# Patient Record
Sex: Female | Born: 1950 | Race: White | Hispanic: No | Marital: Married | Smoking: Former smoker
Health system: Southern US, Community
[De-identification: ages and names within clinical notes are randomized; demographics above are authoritative.]

## PROBLEM LIST (undated history)

## (undated) DIAGNOSIS — J45909 Unspecified asthma, uncomplicated: Secondary | ICD-10-CM

## (undated) DIAGNOSIS — F32A Depression, unspecified: Secondary | ICD-10-CM

## (undated) DIAGNOSIS — G629 Polyneuropathy, unspecified: Secondary | ICD-10-CM

## (undated) DIAGNOSIS — Z87898 Personal history of other specified conditions: Secondary | ICD-10-CM

## (undated) DIAGNOSIS — G8929 Other chronic pain: Secondary | ICD-10-CM

## (undated) DIAGNOSIS — F329 Major depressive disorder, single episode, unspecified: Secondary | ICD-10-CM

## (undated) DIAGNOSIS — M549 Dorsalgia, unspecified: Secondary | ICD-10-CM

## (undated) DIAGNOSIS — Z8542 Personal history of malignant neoplasm of other parts of uterus: Secondary | ICD-10-CM

## (undated) DIAGNOSIS — I509 Heart failure, unspecified: Secondary | ICD-10-CM

## (undated) DIAGNOSIS — E781 Pure hyperglyceridemia: Secondary | ICD-10-CM

## (undated) DIAGNOSIS — I10 Essential (primary) hypertension: Secondary | ICD-10-CM

## (undated) HISTORY — DX: Essential (primary) hypertension: I10

## (undated) HISTORY — DX: Dorsalgia, unspecified: M54.9

## (undated) HISTORY — DX: Unspecified asthma, uncomplicated: J45.909

## (undated) HISTORY — DX: Other chronic pain: G89.29

## (undated) HISTORY — DX: Morbid (severe) obesity due to excess calories: E66.01

## (undated) HISTORY — DX: Polyneuropathy, unspecified: G62.9

## (undated) HISTORY — DX: Personal history of other specified conditions: Z87.898

## (undated) HISTORY — PX: REPLACEMENT TOTAL KNEE: SUR1224

## (undated) HISTORY — DX: Personal history of malignant neoplasm of other parts of uterus: Z85.42

## (undated) HISTORY — DX: Major depressive disorder, single episode, unspecified: F32.9

## (undated) HISTORY — PX: TOTAL ABDOMINAL HYSTERECTOMY W/ BILATERAL SALPINGOOPHORECTOMY: SHX83

## (undated) HISTORY — DX: Pure hyperglyceridemia: E78.1

## (undated) HISTORY — DX: Depression, unspecified: F32.A

---

## 2001-06-29 ENCOUNTER — Encounter: Payer: Self-pay | Admitting: Emergency Medicine

## 2001-06-29 ENCOUNTER — Emergency Department (HOSPITAL_COMMUNITY): Admission: EM | Admit: 2001-06-29 | Discharge: 2001-06-29 | Payer: Self-pay | Admitting: Emergency Medicine

## 2002-11-29 ENCOUNTER — Other Ambulatory Visit: Admission: RE | Admit: 2002-11-29 | Discharge: 2002-11-29 | Payer: Self-pay | Admitting: Dermatology

## 2006-07-30 HISTORY — PX: CARPAL TUNNEL RELEASE: SHX101

## 2006-08-31 ENCOUNTER — Encounter: Payer: Self-pay | Admitting: Physician Assistant

## 2006-09-20 ENCOUNTER — Ambulatory Visit: Payer: Self-pay | Admitting: Cardiology

## 2006-09-20 ENCOUNTER — Encounter: Payer: Self-pay | Admitting: Physician Assistant

## 2006-09-21 ENCOUNTER — Encounter: Payer: Self-pay | Admitting: Cardiology

## 2006-09-28 ENCOUNTER — Ambulatory Visit: Payer: Self-pay | Admitting: Cardiology

## 2007-12-23 ENCOUNTER — Encounter: Payer: Self-pay | Admitting: Cardiology

## 2009-07-24 ENCOUNTER — Encounter: Payer: Self-pay | Admitting: Cardiology

## 2009-07-31 ENCOUNTER — Encounter: Payer: Self-pay | Admitting: Cardiology

## 2009-08-08 ENCOUNTER — Encounter (INDEPENDENT_AMBULATORY_CARE_PROVIDER_SITE_OTHER): Payer: Self-pay | Admitting: *Deleted

## 2009-08-08 ENCOUNTER — Ambulatory Visit: Payer: Self-pay | Admitting: Cardiology

## 2009-08-08 DIAGNOSIS — R079 Chest pain, unspecified: Secondary | ICD-10-CM | POA: Insufficient documentation

## 2009-08-08 DIAGNOSIS — J45909 Unspecified asthma, uncomplicated: Secondary | ICD-10-CM | POA: Insufficient documentation

## 2009-08-08 DIAGNOSIS — G4733 Obstructive sleep apnea (adult) (pediatric): Secondary | ICD-10-CM | POA: Insufficient documentation

## 2009-08-08 DIAGNOSIS — E7800 Pure hypercholesterolemia, unspecified: Secondary | ICD-10-CM | POA: Insufficient documentation

## 2009-08-08 DIAGNOSIS — E781 Pure hyperglyceridemia: Secondary | ICD-10-CM

## 2009-08-08 DIAGNOSIS — R072 Precordial pain: Secondary | ICD-10-CM | POA: Insufficient documentation

## 2009-08-08 DIAGNOSIS — I10 Essential (primary) hypertension: Secondary | ICD-10-CM | POA: Insufficient documentation

## 2009-08-08 DIAGNOSIS — R609 Edema, unspecified: Secondary | ICD-10-CM | POA: Insufficient documentation

## 2009-08-09 ENCOUNTER — Encounter: Payer: Self-pay | Admitting: Cardiology

## 2009-08-10 ENCOUNTER — Ambulatory Visit: Payer: Self-pay | Admitting: Cardiology

## 2009-08-10 ENCOUNTER — Inpatient Hospital Stay (HOSPITAL_BASED_OUTPATIENT_CLINIC_OR_DEPARTMENT_OTHER): Admission: RE | Admit: 2009-08-10 | Discharge: 2009-08-10 | Payer: Self-pay | Admitting: Cardiology

## 2009-08-23 ENCOUNTER — Encounter (INDEPENDENT_AMBULATORY_CARE_PROVIDER_SITE_OTHER): Payer: Self-pay | Admitting: *Deleted

## 2010-04-03 LAB — CBC
HCT: 42.7 % (ref 36.0–46.0)
Hemoglobin: 14.7 g/dL (ref 12.0–15.0)
MCH: 31.7 pg (ref 26.0–34.0)
MCHC: 34.4 g/dL (ref 30.0–36.0)
MCV: 92 fL (ref 78.0–100.0)
Platelets: 187 10*3/uL (ref 150–400)
RBC: 4.64 MIL/uL (ref 3.87–5.11)
RDW: 13.6 % (ref 11.5–15.5)
WBC: 7.5 10*3/uL (ref 4.0–10.5)

## 2010-04-03 LAB — BASIC METABOLIC PANEL
BUN: 12 mg/dL (ref 6–23)
CO2: 30 mEq/L (ref 19–32)
Calcium: 9.6 mg/dL (ref 8.4–10.5)
Chloride: 105 mEq/L (ref 96–112)
Creatinine, Ser: 0.72 mg/dL (ref 0.4–1.2)
GFR calc Af Amer: >60 mL/min (ref >60)
GFR calc non Af Amer: >60 mL/min (ref >60)
Glucose, Bld: 95 mg/dL (ref 70–99)
Potassium: 4.6 mEq/L (ref 3.5–5.1)
Sodium: 142 mEq/L (ref 135–145)

## 2010-04-03 LAB — TYPE AND SCREEN
ABO/RH(D): A POS
Antibody Screen: NEGATIVE

## 2010-04-03 LAB — ABO/RH: ABO/RH(D): A POS

## 2010-04-09 ENCOUNTER — Inpatient Hospital Stay (HOSPITAL_COMMUNITY)
Admission: RE | Admit: 2010-04-09 | Discharge: 2010-04-12 | Payer: Self-pay | Source: Home / Self Care | Attending: Orthopedic Surgery | Admitting: Orthopedic Surgery

## 2010-04-15 LAB — PROTIME-INR
INR: 1.03 (ref 0.00–1.49)
INR: 1.11 (ref 0.00–1.49)
INR: 1.2 (ref 0.00–1.49)
Prothrombin Time: 13.7 seconds (ref 11.6–15.2)
Prothrombin Time: 14.5 seconds (ref 11.6–15.2)
Prothrombin Time: 15.4 seconds — ABNORMAL HIGH (ref 11.6–15.2)

## 2010-04-15 LAB — BASIC METABOLIC PANEL
BUN: 12 mg/dL (ref 6–23)
BUN: 12 mg/dL (ref 6–23)
CO2: 26 mEq/L (ref 19–32)
CO2: 32 mEq/L (ref 19–32)
Calcium: 8.5 mg/dL (ref 8.4–10.5)
Calcium: 9.1 mg/dL (ref 8.4–10.5)
Chloride: 99 mEq/L (ref 96–112)
Chloride: 103 mEq/L (ref 96–112)
Creatinine, Ser: 0.66 mg/dL (ref 0.4–1.2)
Creatinine, Ser: 0.74 mg/dL (ref 0.4–1.2)
GFR calc Af Amer: >60 mL/min (ref >60)
GFR calc Af Amer: >60 mL/min (ref >60)
GFR calc non Af Amer: >60 mL/min (ref >60)
GFR calc non Af Amer: >60 mL/min (ref >60)
Glucose, Bld: 137 mg/dL — ABNORMAL HIGH (ref 70–99)
Glucose, Bld: 139 mg/dL — ABNORMAL HIGH (ref 70–99)
Potassium: 4.7 mEq/L (ref 3.5–5.1)
Potassium: 4.8 mEq/L (ref 3.5–5.1)
Sodium: 134 mEq/L — ABNORMAL LOW (ref 135–145)
Sodium: 139 mEq/L (ref 135–145)

## 2010-04-15 LAB — CBC
HCT: 36 % (ref 36.0–46.0)
HCT: 35 % — ABNORMAL LOW (ref 36.0–46.0)
HCT: 32.5 % — ABNORMAL LOW (ref 36.0–46.0)
HCT: 32.3 % — ABNORMAL LOW (ref 36.0–46.0)
Hemoglobin: 12.2 g/dL (ref 12.0–15.0)
Hemoglobin: 11.7 g/dL — ABNORMAL LOW (ref 12.0–15.0)
Hemoglobin: 11 g/dL — ABNORMAL LOW (ref 12.0–15.0)
Hemoglobin: 11.1 g/dL — ABNORMAL LOW (ref 12.0–15.0)
MCH: 31.3 pg (ref 26.0–34.0)
MCH: 30.7 pg (ref 26.0–34.0)
MCH: 31.2 pg (ref 26.0–34.0)
MCH: 31.3 pg (ref 26.0–34.0)
MCHC: 33.9 g/dL (ref 30.0–36.0)
MCHC: 33.4 g/dL (ref 30.0–36.0)
MCHC: 33.8 g/dL (ref 30.0–36.0)
MCHC: 34.4 g/dL (ref 30.0–36.0)
MCV: 92.3 fL (ref 78.0–100.0)
MCV: 91.9 fL (ref 78.0–100.0)
MCV: 92.1 fL (ref 78.0–100.0)
MCV: 91 fL (ref 78.0–100.0)
Platelets: 32 10*3/uL — ABNORMAL LOW (ref 150–400)
Platelets: 156 10*3/uL (ref 150–400)
Platelets: 151 10*3/uL (ref 150–400)
Platelets: 161 10*3/uL (ref 150–400)
RBC: 3.9 MIL/uL (ref 3.87–5.11)
RBC: 3.81 MIL/uL — ABNORMAL LOW (ref 3.87–5.11)
RBC: 3.53 MIL/uL — ABNORMAL LOW (ref 3.87–5.11)
RBC: 3.55 MIL/uL — ABNORMAL LOW (ref 3.87–5.11)
RDW: 14 % (ref 11.5–15.5)
RDW: 13.8 % (ref 11.5–15.5)
RDW: 14.1 % (ref 11.5–15.5)
RDW: 13.7 % (ref 11.5–15.5)
WBC: 8.5 10*3/uL (ref 4.0–10.5)
WBC: 9 10*3/uL (ref 4.0–10.5)
WBC: 9.5 10*3/uL (ref 4.0–10.5)
WBC: 10 10*3/uL (ref 4.0–10.5)

## 2010-04-15 LAB — URINALYSIS, MICROSCOPIC ONLY
Hgb urine dipstick: NEGATIVE
Ketones, ur: NEGATIVE mg/dL
Nitrite: POSITIVE — AB
Protein, ur: NEGATIVE mg/dL
Specific Gravity, Urine: 1.031 — ABNORMAL HIGH (ref 1.005–1.030)
Urine Glucose, Fasting: NEGATIVE mg/dL
Urobilinogen, UA: 1 mg/dL (ref 0.0–1.0)
pH: 5.5 (ref 5.0–8.0)

## 2010-04-15 LAB — COMPREHENSIVE METABOLIC PANEL
ALT: 20 U/L (ref 0–35)
AST: 17 U/L (ref 0–37)
Albumin: 3 g/dL — ABNORMAL LOW (ref 3.5–5.2)
Alkaline Phosphatase: 39 U/L (ref 39–117)
BUN: 7 mg/dL (ref 6–23)
CO2: 31 mEq/L (ref 19–32)
Calcium: 8.3 mg/dL — ABNORMAL LOW (ref 8.4–10.5)
Chloride: 96 mEq/L (ref 96–112)
Creatinine, Ser: 0.62 mg/dL (ref 0.4–1.2)
GFR calc Af Amer: >60 mL/min (ref >60)
GFR calc non Af Amer: >60 mL/min (ref >60)
Glucose, Bld: 130 mg/dL — ABNORMAL HIGH (ref 70–99)
Potassium: 4.6 mEq/L (ref 3.5–5.1)
Sodium: 131 mEq/L — ABNORMAL LOW (ref 135–145)
Total Bilirubin: 1.1 mg/dL (ref 0.3–1.2)
Total Protein: 6.5 g/dL (ref 6.0–8.3)

## 2010-04-15 LAB — URINALYSIS, ROUTINE W REFLEX MICROSCOPIC
Hgb urine dipstick: NEGATIVE
Ketones, ur: NEGATIVE mg/dL
Nitrite: NEGATIVE
Protein, ur: NEGATIVE mg/dL
Specific Gravity, Urine: 1.023 (ref 1.005–1.030)
Urine Glucose, Fasting: NEGATIVE mg/dL
Urobilinogen, UA: 1 mg/dL (ref 0.0–1.0)
pH: 5.5 (ref 5.0–8.0)

## 2010-04-15 LAB — URINE CULTURE
Colony Count: NO GROWTH
Culture  Setup Time: 201201112254
Culture: NO GROWTH

## 2010-04-15 LAB — URINE MICROSCOPIC-ADD ON

## 2010-04-19 NOTE — Discharge Summary (Signed)
[name], [name] NO.:  [id number]  MEDICAL RECORD NO.:  [id number]          PATIENT TYPE:  INP  LOCATION:  5016                         FACILITY:  [facility]  PHYSICIAN:  Eulas Post, MD    DATE OF BIRTH:  October 23, 1950  DATE OF ADMISSION:  04/09/2010 DATE OF DISCHARGE:  04/12/2010                              DISCHARGE SUMMARY   ADMISSION DIAGNOSIS:  His left knee osteoarthritis.  DISCHARGE DIAGNOSIS:  His left knee osteoarthritis.  ADDITIONAL DIAGNOSES: 1. Chronic baseline urinary frequency as well as hematuria. 2. Morbid obesity. 3. Asthma.  HOSPITAL COURSE:  Ms. Jenna Sherman is a 60 year old woman who presented for elective left total knee arthroplasty.  She tolerated the procedure well and postoperatively did not have any complications.  She is given Coumadin for DVT prophylaxis as well as early ambulation and sequential compression devices.  I had initially planned to give her Lovenox and Coumadin, however, she was having dark-colored urine, as well as questionable thrombocytopenia that actually turned out to be a lab error and given the hematuria, I did not continue with Lovenox, and rather simply used Coumadin and sequential compression devices.  The sequence of events was as follows:  Prior to Foley placement, she had a urinalysis, which demonstrated hyaline casts as well as calcium oxalate crystals.  According to her husband, she has had substantial urinary frequency for the last year or so.  She has also had extremely dark amber urine for the past year, and has also had a very foul smelling urine.  She seen her primary care physician for this, but has not seen a urologist.  Upon identifying these casts and also the mucus within the urine and also the discoloration and the calcium oxalate crystals, I consulted Nephrology who saw Jenna Sherman and indicated that her urinalysis findings were incidental and of no consequence.  They recommended  hematology consultation, given that it appears that her platelet count was extremely low.  The platelet count itself was reported as 32,000. Apparently, however, the specimen itself had clotted, and hematology consult saw the patient and indicated that the results were spurious, and to be ignored.  Nevertheless, I had already discontinued her Lovenox, and continued to hold the Lovenox due to the color of the urine and concern for active bleeding.  The hematology consultation recommended urology consultation.  I did speak with Dr. Jethro Bolus who recommended outpatient urologic followup for her chronic polyuria, odor, and discoloration of the urine.  She subsequently had her Foley removed, and was able to void independently, and was improving quite nicely and was working with physical therapy, making steady progress.  Her dressings were changed on postoperative day #3, and her wounds were clean, dry, and intact. Sensation was intact throughout her leg.  EHL and FHL were firing.  Her hemoglobin, hematocrit, and platelets remained stable throughout her hospital stay, as did her renal function and electrolyte panel.  She is planned to be discharged home with follow up with me in approximately 2 weeks, and I have also recommended a follow up with Dr. Patsi Sears at [facility] for  her urologic issues.  She benefited maximum from this hospital stay and is being discharged home.     Eulas Post, MD     JPL/MEDQ  D:  04/12/2010  T:  04/12/2010  Job:  [id number]  Electronically Signed by Teryl Lucy MD on 04/19/2010 10:03:22 AM

## 2010-04-25 LAB — SURGICAL PCR SCREEN
MRSA, PCR: NEGATIVE
Staphylococcus aureus: NEGATIVE

## 2010-04-30 NOTE — Letter (Signed)
Summary: Engineer, materials at [facility]  [address]   [place], [place] [address]   Phone: [phone]  Fax: [phone]        Aug 23, 2009 MRN: [id number]   [facility] [address] [place], [place]  [address]   Dear Ms. Yzaguirre,  Your test ordered by Selena Batten has been reviewed by your physician (or physician assistant) and was found to be normal or stable. Your physician (or physician assistant) felt no changes were needed at this time.  ____ Echocardiogram  ____ Cardiac Stress Test  __X__ Lab Work  ____ Peripheral vascular study of arms, legs or neck  __X__ Chest X-ray  ____ Lung or Breathing test  ____ Other:   Thank you.   Laray Anger, M.D., F.A.C.C. Thressa Sheller, M.D., F.A.C.C. Oneal Grout, M.D., F.A.C.C. Cheree Ditto, M.D., F.A.C.C. Daiva Nakayama, M.D., F.A.C.C. Kenney Houseman, M.D., F.A.C.C. Jeanne Ivan, PA-C

## 2010-04-30 NOTE — Letter (Signed)
Summary: External Correspondence/ OFFICE NOTE DR. [name]  External Correspondence/ OFFICE NOTE DR. [name]   Imported By: Dorise Hiss 08/06/2009 11:13:31  _____________________________________________________________________  External Attachment:    Type:   Image     Comment:   External Document

## 2010-04-30 NOTE — Letter (Signed)
Summary: External Correspondence/ FAXED PRE-CATH ORDER  External Correspondence/ FAXED PRE-CATH ORDER   Imported By: Dorise Hiss 08/30/2009 12:39:14  _____________________________________________________________________  External Attachment:    Type:   Image     Comment:   External Document

## 2010-04-30 NOTE — Letter (Signed)
Summary: Cardiac Cath Instructions - [facility]  [facility] [address]   [place], [place] [address]   Phone: [phone]  Fax: [phone]     08/08/2009 MRN: [id number]  [facility] [address] [place], [place]  [address]  Dear Ms. Grosser,   You are scheduled for a Cardiac Catheterization on Friday, May 13 at 9:30 with Dr. Shirlee Latch.   Please arrive to the 1st floor of the Heart and Vascular Center at [facility] at 8:30am / pm on the day of your procedure. Please do not arrive before 6:30 a.m. Call the Heart and Vascular Center at [phone] if you are unable to make your appointmnet. The Code to get into the parking garage under the building is 0010 . Take the elevators to the 1st floor. You must have someone to drive you home. Someone must be with you for the first 24 hours after you arrive home. Please wear clothes that are easy to get on and off and wear slip-on shoes. Do not eat or drink after midnight except water with your medications that morning. Bring all your medications and current insurance cards with you.  ___ DO NOT take these medications before your procedure:  _X__ Make sure you take your aspirin.  _X__ You may take ALL of your medications with water that morning.  ___ DO NOT take ANY medications before your procedure.  ___ Pre-med instructions:  ________________________________________________________________________  The usual length of stay after your procedure is 2 to 3 hours. This can vary.  If you have any questions, please call the office at the number listed above.  Hoover Brunette, LPN                 Directions to the [facility]  Please Note : Park in [place] under the building not the parking deck.  From [employer]: Turn onto [address] Left onto [address] (1st stoplight) Right at the brick entrance to the hospital ([address] circle [address]) Bear to the right and  you will see a blue sign "Heart and Vascular Center" Parking garage is a sharp right'to get through the gate out in the code _______. Once you park, take the elevator to the first floor. Please do not arrive before 0630am. The building will be dark before that time.   From [address] Turn onto [address] Turn left into the brick entrance to the hospital (Main circle drive) [place] to the right and you will see a blue sign "[facility] Center" Parking garage is a sharp right, to get thru the gate put in the code ____. Once you park, take the elevator to the first floor. Please do not arrive before 0630am. The building will be dark before that time
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
## 2010-07-03 ENCOUNTER — Other Ambulatory Visit: Payer: Self-pay | Admitting: Cardiology

## 2010-08-13 NOTE — Assessment & Plan Note (Signed)
[facility]                          [facility] OFFICE NOTE   Jenna Sherman, Jenna Sherman                         MRN:          [id number]  DATE:09/28/2006                            DOB:          1950-10-22    HISTORY OF PRESENT ILLNESS:  The patient is a 60 year old female with no  prior documented history of coronary artery disease.  The patient was  evaluated for chest pain on September 21, 2006 in the hospital.  She had  normal serum cardiac markers.  The patient did have multiple risk  factors including hypertriglyceridemia, obesity, and a strong family  history of coronary artery disease.  The patient was scheduled for a  Cardiolite study.  This demonstrated no definite significant  abnormalities although the patient reported chest pain during the test,  this was an infusion study and this is nondiagnostic.  There were no EKG  changes however seen of ischemia and the perfusion data were also  normal.   The patient states that she was called by Dr. Dian Situ office and was  told by Ron that although the stress test looked okay there were still  some problems with her blood vessels.  I assume this referred to the  fact that they read that the patient had clinically positive test for  ischemia although this was nondiagnostic due to the fact that adenosine  was used.  I am not sure that this was related properly to the patient.  It does also not appear that she had ABIs done or carotid Dopplers so I  do not think any other testing was done.   MEDICATIONS:  Tricor, Advair inhaler, and aspirin.   PHYSICAL EXAMINATION:  VITAL SIGNS:  146/78, heart rate 73, weight is  260 pounds.  NECK:  Normal carotid upstroke and no carotid bruits.  LUNGS: Clear breath sounds bilaterally.  HEART:  Regular rate and rhythm, normal S1-S2.  No murmurs, rubs, or  gallops.  ABDOMEN:  Soft, nontender, no rebound or guarding.  Good bowel sounds.  EXTREMITY:  No cyanosis,  clubbing, or edema.  NEURO:  Patient is alert, oriented, and grossly nonfocal.   PROBLEM:  1. History of substernal chest pain, ruled out for myocardial      infarction with negative Cardiolite study.  2. Possible hypertension.  3. Obesity.  4. Hypertriglyceridemia.  5. Exertional dyspnea secondary to deconditioning and obesity.   PLAN:  1. The patient's stress study was essentially within normal limits.      The patient did report chest pain during drug infusion but this was      adenosine, therefore nondiagnostic.  The study was reassuring and I      related this to the patient.  I do not think she has significant      coronary artery disease based on this data.  She also reports no      recurrent symptoms of chest pain.  2. I had a long talk with the patient regarding lifestyle      modifications and that she needs to lose significant amount of  weight which is contributing to her dyspnea.  3. The patient can follow up with [place] in 3 months, no further      cardiovascular testing is required.  The patient needs aggressive      risk factor modification by her primary care physician.     Learta Codding, MD,FACC  Electronically Signed    GED/MedQ  DD: 09/28/2006  DT: 09/28/2006  Job #: [id number]   cc:   Mila Palmer

## 2010-10-14 ENCOUNTER — Other Ambulatory Visit: Payer: Self-pay | Admitting: Cardiology

## 2011-10-15 ENCOUNTER — Encounter: Payer: Self-pay | Admitting: Cardiology

## 2011-11-02 IMAGING — CR DG KNEE 1-2V PORT*L*
2 series · 2 of 2 positions shown · non-contrast
Comparison: None.

CLINICAL DATA: Postop left total knee replacement.

PORTABLE LEFT KNEE - 1-2 VIEW

[view not recorded (1 of 2)]
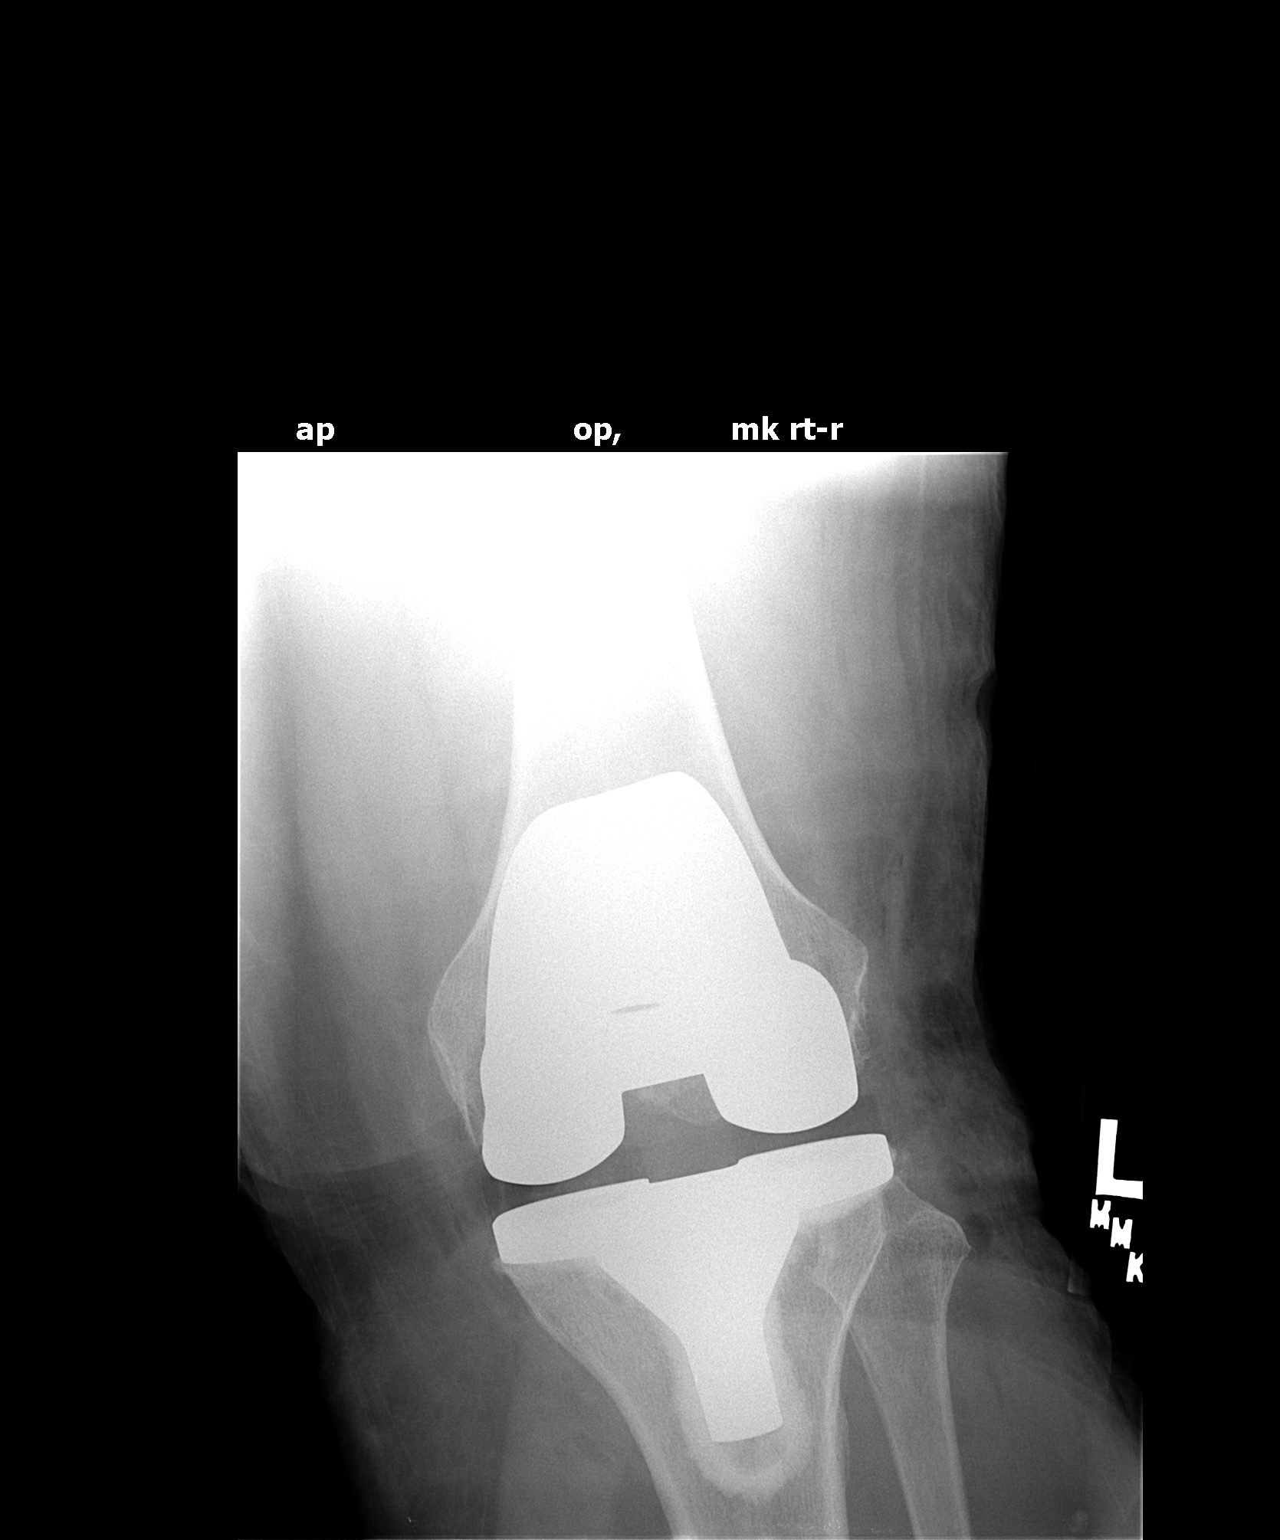

[view not recorded (2 of 2)]
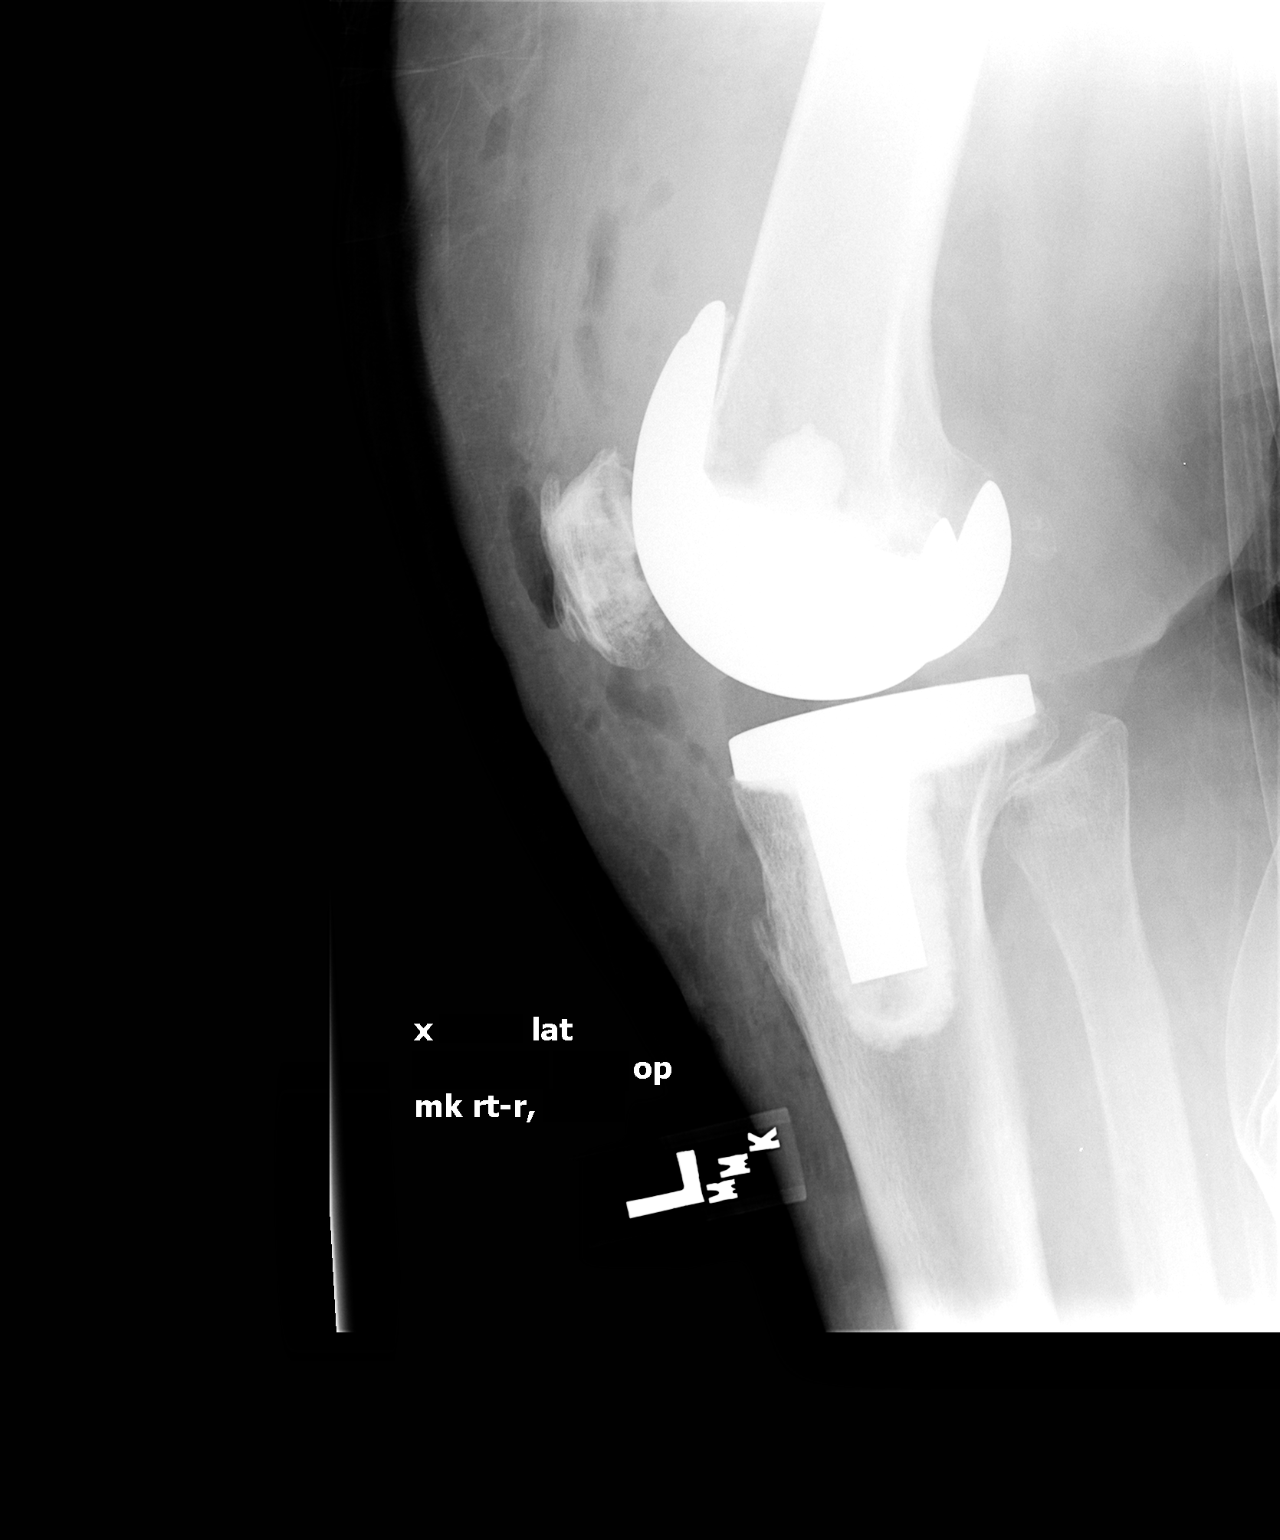

[2 of 2 positions shown; findings below may reference images not displayed]

FINDINGS: 7426 hours.  The patient is status post left total knee
replacement.  The hardware appears well positioned.  There is a
knee joint effusion with air in the joint and immediate surrounding
soft tissues.  There is no evidence of acute fracture or
dislocation.
IMPRESSION: No demonstrated complication following left total knee replacement.

## 2012-12-16 ENCOUNTER — Ambulatory Visit: Payer: Self-pay | Admitting: Cardiovascular Disease

## 2012-12-16 ENCOUNTER — Encounter: Payer: Self-pay | Admitting: Cardiovascular Disease

## 2013-06-02 ENCOUNTER — Ambulatory Visit (INDEPENDENT_AMBULATORY_CARE_PROVIDER_SITE_OTHER): Payer: BC Managed Care – PPO | Admitting: Cardiology

## 2013-06-02 ENCOUNTER — Encounter: Payer: Self-pay | Admitting: *Deleted

## 2013-06-02 ENCOUNTER — Encounter: Payer: Self-pay | Admitting: Cardiology

## 2013-06-02 VITALS — BP 147/76 | HR 68 | Ht 59.0 in | Wt 273.0 lb

## 2013-06-02 DIAGNOSIS — M79606 Pain in leg, unspecified: Secondary | ICD-10-CM

## 2013-06-02 DIAGNOSIS — R011 Cardiac murmur, unspecified: Secondary | ICD-10-CM

## 2013-06-02 DIAGNOSIS — M79609 Pain in unspecified limb: Secondary | ICD-10-CM

## 2013-06-02 DIAGNOSIS — Z136 Encounter for screening for cardiovascular disorders: Secondary | ICD-10-CM

## 2013-06-02 DIAGNOSIS — R079 Chest pain, unspecified: Secondary | ICD-10-CM

## 2013-06-02 NOTE — Progress Notes (Signed)
Clinical Summary Jenna Sherman is a 63 y.o.female seen today as a new patient for chest pain.   1. Chest pain - started approx 1 year ago. Tightness in chest down left arm, 7/10. Feels nauseous, sweaty. + SOB. Can occur at rest or with exertion. Lasts for 1-2 minutes. Occurs twice a week. Increasing in frequency, increasing in severity. Not positional. DOE at < 1/2 block, which is worst than before.  - prior stress test 2008, no ischemia  CAD risk factors: HL, HTN, father MI age 48  2, Leg pain - right leg pain, cramping and swelling in right calf. Hurts at rest and with exertion. Has been bothering x 1 month. Reports she has a son with a history of blood clots, no personal history.    Past Medical History  Diagnosis Date  . Hypertriglyceridemia   . Asthma   . Chronic back pain   . Morbid obesity   . History of vertigo   . Hypertension     Normal LVF (EF 65%) by 2D Echo, August 2005. Mild left ventricular hypertrophy; mild mitral regurgitation  . History of uterine cancer   . Peripheral neuropathy   . Depression      Allergies no known allergies   Current Outpatient Prescriptions  Medication Sig Dispense Refill  . cetirizine (ZYRTEC) 10 MG tablet Take 10 mg by mouth daily.      . clonazePAM (KLONOPIN) 0.5 MG tablet Take 0.5 mg by mouth every 8 (eight) hours as needed for anxiety.      . cyclobenzaprine (FLEXERIL) 10 MG tablet Take 10 mg by mouth at bedtime.      . diclofenac (VOLTAREN) 75 MG EC tablet Take 75 mg by mouth 2 (two) times daily.      Marland Kitchen escitalopram (LEXAPRO) 10 MG tablet Take 10 mg by mouth daily.      Marland Kitchen gabapentin (NEURONTIN) 300 MG capsule Take 300 mg by mouth at bedtime.      . IMDUR 30 MG 24 hr tablet TAKE 1 TABLET ONCE DAILY  15 each  0   No current facility-administered medications for this visit.     Past Surgical History  Procedure Laterality Date  . Total abdominal hysterectomy w/ bilateral salpingoophorectomy      uterine cancer  . Carpal  tunnel release  07/2006     Allergies no known allergies    Family History  Problem Relation Age of Onset  . Heart attack Brother 43  . Heart attack Father 27  . CAD Brother   . CAD Father   . Heart disease Sister     x's 2     Social History Jenna Sherman reports that she quit smoking about 45 years ago. Her smoking use included Cigarettes. She smoked 0.00 packs per day. She does not have any smokeless tobacco history on file. Jenna Sherman has no alcohol history on file.   Review of Systems CONSTITUTIONAL: No weight loss, fever, chills, weakness or fatigue.  HEENT: Eyes: No visual loss, blurred vision, double vision or yellow sclerae.No hearing loss, sneezing, congestion, runny nose or sore throat.  SKIN: No rash or itching.  CARDIOVASCULAR: per HPI RESPIRATORY: shortness of breath.  GASTROINTESTINAL: No anorexia, nausea, vomiting or diarrhea. No abdominal pain or blood.  GENITOURINARY: No burning on urination, no polyuria NEUROLOGICAL: No headache, dizziness, syncope, paralysis, ataxia, numbness or tingling in the extremities. No change in bowel or bladder control.  MUSCULOSKELETAL: leg pain  LYMPHATICS: No enlarged nodes. No  history of splenectomy.  PSYCHIATRIC: No history of depression or anxiety.  ENDOCRINOLOGIC: No reports of sweating, cold or heat intolerance. No polyuria or polydipsia.  Marland Kitchen.   Physical Examination p 68 bp 147/76 Wt 273 lbs BMI 55 Gen: resting comfortably, no acute distress HEENT: no scleral icterus, pupils equal round and reactive, no palptable cervical adenopathy,  CV: RRR, 2/6 systolic murmur RUSB early peaking, no JVD, no carotid bruits Resp: Clear to auscultation bilaterally GI: abdomen is soft, non-tender, non-distended, normal bowel sounds, no hepatosplenomegaly MSK: extremities are warm, no edema.  Skin: warm, no rash Neuro:  no focal deficits Psych: appropriate affect   Diagnostic Studies 06/02/13 Clinic EKG Normal sinus rhythm, LAD, non-spec  ST/T changes    Assessment and Plan   1. Chest pain - multiple CAD risk factors including family history of early CAD/MI - will obtain Lexiscan to futher evaluate  2. Leg pain - obtain LE venous dopplers  3. Heart murmur - obtain echocardiogram  Follow up 3 months   Jenna PocheJonathan F. Sherman, M.D., F.A.C.C.

## 2013-06-02 NOTE — Patient Instructions (Signed)
Your physician recommends that you schedule a follow-up appointment in: 3 month with Dr. Wyline MoodBranch. This appointment will be scheduled today before you leave.  Your physician recommends that you continue on your current medications as directed. Please refer to the Current Medication list given to you today.  Your physician has requested that you have an echocardiogram. Echocardiography is a painless test that uses sound waves to create images of your heart. It provides your doctor with information about the size and shape of your heart and how well your heart's chambers and valves are working. This procedure takes approximately one hour. There are no restrictions for this procedure.  Your physician has requested that you have a lexiscan myoview. For further information please visit [email or web address]. Please follow instruction sheet, as given.  Your physician has requested you have a lower extremity doppler.

## 2013-06-15 ENCOUNTER — Other Ambulatory Visit (INDEPENDENT_AMBULATORY_CARE_PROVIDER_SITE_OTHER): Payer: BC Managed Care – PPO

## 2013-06-15 ENCOUNTER — Other Ambulatory Visit: Payer: Self-pay

## 2013-06-15 DIAGNOSIS — R079 Chest pain, unspecified: Secondary | ICD-10-CM

## 2013-06-15 DIAGNOSIS — R011 Cardiac murmur, unspecified: Secondary | ICD-10-CM

## 2013-06-16 ENCOUNTER — Encounter (INDEPENDENT_AMBULATORY_CARE_PROVIDER_SITE_OTHER): Payer: BC Managed Care – PPO | Admitting: Cardiology

## 2013-06-16 DIAGNOSIS — M7989 Other specified soft tissue disorders: Secondary | ICD-10-CM

## 2013-06-16 DIAGNOSIS — M79609 Pain in unspecified limb: Secondary | ICD-10-CM

## 2013-06-21 ENCOUNTER — Encounter (HOSPITAL_COMMUNITY): Payer: BC Managed Care – PPO

## 2013-06-21 ENCOUNTER — Inpatient Hospital Stay (HOSPITAL_COMMUNITY): Admission: RE | Admit: 2013-06-21 | Payer: BC Managed Care – PPO | Source: Ambulatory Visit

## 2013-06-22 ENCOUNTER — Telehealth: Payer: Self-pay | Admitting: Cardiology

## 2013-06-22 NOTE — Telephone Encounter (Signed)
LM for pt to returncall

## 2013-06-22 NOTE — Telephone Encounter (Signed)
Message copied by Burnice LoganATES, Antoino Westhoff M on Wed Jun 22, 2013 11:51 AM ------      Message from: LakelandBRANCH, JONATHAN F      Created: Mon Jun 20, 2013  9:03 AM       Please let patient know that overall no significant abnormalities on echo            Dina RichJonathan Branch MD ------

## 2013-06-27 ENCOUNTER — Encounter: Payer: Self-pay | Admitting: Cardiology

## 2013-06-27 NOTE — Telephone Encounter (Signed)
Mailed letter to pt informing pt of results of echocardiogram and LE venous duplex

## 2013-08-05 NOTE — Progress Notes (Signed)
This encounter was created in error - please disregard.  This encounter was created in error - please disregard.

## 2013-09-07 ENCOUNTER — Encounter: Payer: Self-pay | Admitting: Cardiology

## 2013-09-07 ENCOUNTER — Ambulatory Visit: Payer: BC Managed Care – PPO | Admitting: Cardiology

## 2013-09-07 NOTE — Progress Notes (Unsigned)
Clinical Summary Jenna Sherman is a 64 y.o.female seen today for follow up of the following medical problems.   1. Chest pain  - started approx 1 year ago. Tightness in chest down left arm, 7/10. Feels nauseous, sweaty. + SOB. Can occur at rest or with exertion. Lasts for 1-2 minutes. Occurs twice a week. Increasing in frequency, increasing in severity. Not positional. DOE at < 1/2 block, which is worst than before.  - prior stress test 2008, no ischemia  CAD risk factors: HL, HTN, father MI age 57   - since last visit she completed an echo which showed LVEF 60-65%, grade I diastolic dysfunction.  ? Stress test   2, Leg pain  - right leg pain, cramping and swelling in right calf. Hurts at rest and with exertion. Has been bothering x 1 month. Reports she has a son with a history of blood clots, no personal history.   Past Medical History  Diagnosis Date  . Hypertriglyceridemia   . Asthma   . Chronic back pain   . Morbid obesity   . History of vertigo   . Hypertension     Normal LVF (EF 65%) by 2D Echo, August 2005. Mild left ventricular hypertrophy; mild mitral regurgitation  . History of uterine cancer   . Peripheral neuropathy   . Depression      No Known Allergies   Current Outpatient Prescriptions  Medication Sig Dispense Refill  . cetirizine (ZYRTEC) 10 MG tablet Take 10 mg by mouth daily.      . clonazePAM (KLONOPIN) 0.5 MG tablet Take 0.5 mg by mouth every 8 (eight) hours as needed for anxiety.      . cyclobenzaprine (FLEXERIL) 10 MG tablet Take 10 mg by mouth at bedtime.      . diclofenac (VOLTAREN) 75 MG EC tablet Take 75 mg by mouth 2 (two) times daily.      Marland Kitchen escitalopram (LEXAPRO) 10 MG tablet Take 10 mg by mouth daily.      Marland Kitchen gabapentin (NEURONTIN) 300 MG capsule Take 300 mg by mouth at bedtime.      . IMDUR 30 MG 24 hr tablet TAKE 1 TABLET ONCE DAILY  15 each  0   No current facility-administered medications for this visit.     Past Surgical History    Procedure Laterality Date  . Total abdominal hysterectomy w/ bilateral salpingoophorectomy      uterine cancer  . Carpal tunnel release  07/2006     No Known Allergies    Family History  Problem Relation Age of Onset  . Heart attack Brother 43  . Heart attack Father 26  . CAD Brother   . CAD Father   . Heart disease Sister     x's 2     Social History Ms. Arizola reports that she quit smoking about 45 years ago. Her smoking use included Cigarettes. She smoked 0.00 packs per day for 1 year. She has never used smokeless tobacco. Ms. Labore has no alcohol history on file.   Review of Systems CONSTITUTIONAL: No weight loss, fever, chills, weakness or fatigue.  HEENT: Eyes: No visual loss, blurred vision, double vision or yellow sclerae.No hearing loss, sneezing, congestion, runny nose or sore throat.  SKIN: No rash or itching.  CARDIOVASCULAR:  RESPIRATORY: No shortness of breath, cough or sputum.  GASTROINTESTINAL: No anorexia, nausea, vomiting or diarrhea. No abdominal pain or blood.  GENITOURINARY: No burning on urination, no polyuria NEUROLOGICAL: No headache, dizziness,  syncope, paralysis, ataxia, numbness or tingling in the extremities. No change in bowel or bladder control.  MUSCULOSKELETAL: No muscle, back pain, joint pain or stiffness.  LYMPHATICS: No enlarged nodes. No history of splenectomy.  PSYCHIATRIC: No history of depression or anxiety.  ENDOCRINOLOGIC: No reports of sweating, cold or heat intolerance. No polyuria or polydipsia.  Marland Kitchen.   Physical Examination There were no vitals filed for this visit. There were no vitals filed for this visit.  Gen: resting comfortably, no acute distress HEENT: no scleral icterus, pupils equal round and reactive, no palptable cervical adenopathy,  CV Resp: Clear to auscultation bilaterally GI: abdomen is soft, non-tender, non-distended, normal bowel sounds, no hepatosplenomegaly MSK: extremities are warm, no edema.  Skin:  warm, no rash Neuro:  no focal deficits Psych: appropriate affect   Diagnostic Studies 05/2013 Echo Study Conclusions  - Procedure narrative: Transthoracic echocardiography. Image quality was poor. - Left ventricle: The cavity size was normal. Wall thickness was increased in a pattern of moderate LVH. Systolic function was normal. The estimated ejection fraction was in the range of 60% to 65%. Images were inadequate for LV wall motion assessment. Doppler parameters are consistent with abnormal left ventricular relaxation (grade 1 diastolic dysfunction). Doppler parameters are consistent with high ventricular filling pressure. - Mitral valve: Mildly thickened leaflets . - Left atrium: The atrium was mildly dilated.  05/2013 LE Venous US No DVT    Assessment and Plan  1. Chest pain  - multiple CAD risk factors including family history of early CAD/MI  - will obtain Lexiscan to futher evaluate   2. Leg pain  - obtain LE venous dopplers   3. Heart murmur  - obtain echocardiogram       Antoine PocheJonathan F. Marylon Verno, M.D., F.A.C.C.

## 2017-04-24 DIAGNOSIS — R252 Cramp and spasm: Secondary | ICD-10-CM | POA: Diagnosis not present

## 2017-04-24 DIAGNOSIS — Z1389 Encounter for screening for other disorder: Secondary | ICD-10-CM | POA: Diagnosis not present

## 2017-04-24 DIAGNOSIS — E7800 Pure hypercholesterolemia, unspecified: Secondary | ICD-10-CM | POA: Diagnosis not present

## 2017-05-27 DIAGNOSIS — E7800 Pure hypercholesterolemia, unspecified: Secondary | ICD-10-CM | POA: Diagnosis not present

## 2017-05-27 DIAGNOSIS — K210 Gastro-esophageal reflux disease with esophagitis: Secondary | ICD-10-CM | POA: Diagnosis not present

## 2017-06-03 DIAGNOSIS — R252 Cramp and spasm: Secondary | ICD-10-CM | POA: Diagnosis not present

## 2017-06-03 DIAGNOSIS — E7800 Pure hypercholesterolemia, unspecified: Secondary | ICD-10-CM | POA: Diagnosis not present

## 2017-10-15 DIAGNOSIS — R252 Cramp and spasm: Secondary | ICD-10-CM | POA: Diagnosis not present

## 2017-10-15 DIAGNOSIS — E7800 Pure hypercholesterolemia, unspecified: Secondary | ICD-10-CM | POA: Diagnosis not present

## 2018-03-04 DIAGNOSIS — A084 Viral intestinal infection, unspecified: Secondary | ICD-10-CM | POA: Diagnosis not present

## 2018-03-04 DIAGNOSIS — H8309 Labyrinthitis, unspecified ear: Secondary | ICD-10-CM | POA: Diagnosis not present

## 2018-03-04 DIAGNOSIS — J069 Acute upper respiratory infection, unspecified: Secondary | ICD-10-CM | POA: Diagnosis not present

## 2018-03-04 DIAGNOSIS — J45901 Unspecified asthma with (acute) exacerbation: Secondary | ICD-10-CM | POA: Diagnosis not present

## 2018-03-04 DIAGNOSIS — J209 Acute bronchitis, unspecified: Secondary | ICD-10-CM | POA: Diagnosis not present

## 2018-03-15 DIAGNOSIS — E7800 Pure hypercholesterolemia, unspecified: Secondary | ICD-10-CM | POA: Diagnosis not present

## 2018-03-15 DIAGNOSIS — Z23 Encounter for immunization: Secondary | ICD-10-CM | POA: Diagnosis not present

## 2018-03-15 DIAGNOSIS — R252 Cramp and spasm: Secondary | ICD-10-CM | POA: Diagnosis not present

## 2018-06-09 DIAGNOSIS — Z87891 Personal history of nicotine dependence: Secondary | ICD-10-CM | POA: Diagnosis not present

## 2018-06-09 DIAGNOSIS — R05 Cough: Secondary | ICD-10-CM | POA: Diagnosis not present

## 2018-06-09 DIAGNOSIS — R0602 Shortness of breath: Secondary | ICD-10-CM | POA: Diagnosis not present

## 2018-06-09 DIAGNOSIS — R079 Chest pain, unspecified: Secondary | ICD-10-CM | POA: Diagnosis not present

## 2018-06-09 DIAGNOSIS — J189 Pneumonia, unspecified organism: Secondary | ICD-10-CM | POA: Diagnosis not present

## 2018-09-06 DIAGNOSIS — R5383 Other fatigue: Secondary | ICD-10-CM | POA: Diagnosis not present

## 2018-09-06 DIAGNOSIS — K210 Gastro-esophageal reflux disease with esophagitis: Secondary | ICD-10-CM | POA: Diagnosis not present

## 2018-09-06 DIAGNOSIS — E7800 Pure hypercholesterolemia, unspecified: Secondary | ICD-10-CM | POA: Diagnosis not present

## 2018-09-09 DIAGNOSIS — E7800 Pure hypercholesterolemia, unspecified: Secondary | ICD-10-CM | POA: Diagnosis not present

## 2018-09-09 DIAGNOSIS — R252 Cramp and spasm: Secondary | ICD-10-CM | POA: Diagnosis not present

## 2018-09-09 DIAGNOSIS — R062 Wheezing: Secondary | ICD-10-CM | POA: Diagnosis not present

## 2019-01-04 DIAGNOSIS — R7301 Impaired fasting glucose: Secondary | ICD-10-CM | POA: Diagnosis not present

## 2019-01-04 DIAGNOSIS — K2100 Gastro-esophageal reflux disease with esophagitis, without bleeding: Secondary | ICD-10-CM | POA: Diagnosis not present

## 2019-01-04 DIAGNOSIS — E7800 Pure hypercholesterolemia, unspecified: Secondary | ICD-10-CM | POA: Diagnosis not present

## 2019-01-06 DIAGNOSIS — R5383 Other fatigue: Secondary | ICD-10-CM | POA: Diagnosis not present

## 2019-01-06 DIAGNOSIS — R252 Cramp and spasm: Secondary | ICD-10-CM | POA: Diagnosis not present

## 2019-01-06 DIAGNOSIS — Z23 Encounter for immunization: Secondary | ICD-10-CM | POA: Diagnosis not present

## 2019-01-06 DIAGNOSIS — E7800 Pure hypercholesterolemia, unspecified: Secondary | ICD-10-CM | POA: Diagnosis not present

## 2019-01-06 DIAGNOSIS — Z0001 Encounter for general adult medical examination with abnormal findings: Secondary | ICD-10-CM | POA: Diagnosis not present

## 2019-05-05 DIAGNOSIS — R7301 Impaired fasting glucose: Secondary | ICD-10-CM | POA: Diagnosis not present

## 2019-05-05 DIAGNOSIS — K2100 Gastro-esophageal reflux disease with esophagitis, without bleeding: Secondary | ICD-10-CM | POA: Diagnosis not present

## 2019-05-05 DIAGNOSIS — R5383 Other fatigue: Secondary | ICD-10-CM | POA: Diagnosis not present

## 2019-05-05 DIAGNOSIS — E7800 Pure hypercholesterolemia, unspecified: Secondary | ICD-10-CM | POA: Diagnosis not present

## 2019-05-12 DIAGNOSIS — J45909 Unspecified asthma, uncomplicated: Secondary | ICD-10-CM | POA: Diagnosis not present

## 2019-05-12 DIAGNOSIS — R7301 Impaired fasting glucose: Secondary | ICD-10-CM | POA: Diagnosis not present

## 2019-09-08 DIAGNOSIS — R7301 Impaired fasting glucose: Secondary | ICD-10-CM | POA: Diagnosis not present

## 2019-09-08 DIAGNOSIS — E7800 Pure hypercholesterolemia, unspecified: Secondary | ICD-10-CM | POA: Diagnosis not present

## 2019-09-08 DIAGNOSIS — K2100 Gastro-esophageal reflux disease with esophagitis, without bleeding: Secondary | ICD-10-CM | POA: Diagnosis not present

## 2019-09-08 DIAGNOSIS — R5383 Other fatigue: Secondary | ICD-10-CM | POA: Diagnosis not present

## 2019-09-12 DIAGNOSIS — R7301 Impaired fasting glucose: Secondary | ICD-10-CM | POA: Diagnosis not present

## 2019-09-12 DIAGNOSIS — M170 Bilateral primary osteoarthritis of knee: Secondary | ICD-10-CM | POA: Diagnosis not present

## 2019-09-12 DIAGNOSIS — J45909 Unspecified asthma, uncomplicated: Secondary | ICD-10-CM | POA: Diagnosis not present

## 2019-09-27 DIAGNOSIS — S8992XA Unspecified injury of left lower leg, initial encounter: Secondary | ICD-10-CM | POA: Diagnosis not present

## 2019-09-27 DIAGNOSIS — S99921A Unspecified injury of right foot, initial encounter: Secondary | ICD-10-CM | POA: Diagnosis not present

## 2019-09-27 DIAGNOSIS — S99922A Unspecified injury of left foot, initial encounter: Secondary | ICD-10-CM | POA: Diagnosis not present

## 2019-09-27 DIAGNOSIS — Z96652 Presence of left artificial knee joint: Secondary | ICD-10-CM | POA: Diagnosis not present

## 2019-10-27 DIAGNOSIS — J449 Chronic obstructive pulmonary disease, unspecified: Secondary | ICD-10-CM | POA: Diagnosis not present

## 2019-10-27 DIAGNOSIS — R5383 Other fatigue: Secondary | ICD-10-CM | POA: Diagnosis not present

## 2020-01-04 DIAGNOSIS — E7800 Pure hypercholesterolemia, unspecified: Secondary | ICD-10-CM | POA: Diagnosis not present

## 2020-01-04 DIAGNOSIS — R7301 Impaired fasting glucose: Secondary | ICD-10-CM | POA: Diagnosis not present

## 2020-01-04 DIAGNOSIS — K2100 Gastro-esophageal reflux disease with esophagitis, without bleeding: Secondary | ICD-10-CM | POA: Diagnosis not present

## 2020-01-04 DIAGNOSIS — R5383 Other fatigue: Secondary | ICD-10-CM | POA: Diagnosis not present

## 2020-01-09 DIAGNOSIS — E782 Mixed hyperlipidemia: Secondary | ICD-10-CM | POA: Diagnosis not present

## 2020-01-09 DIAGNOSIS — Z23 Encounter for immunization: Secondary | ICD-10-CM | POA: Diagnosis not present

## 2020-01-09 DIAGNOSIS — G629 Polyneuropathy, unspecified: Secondary | ICD-10-CM | POA: Diagnosis not present

## 2020-01-09 DIAGNOSIS — R7301 Impaired fasting glucose: Secondary | ICD-10-CM | POA: Diagnosis not present

## 2020-01-09 DIAGNOSIS — J45909 Unspecified asthma, uncomplicated: Secondary | ICD-10-CM | POA: Diagnosis not present

## 2020-02-28 DIAGNOSIS — E7849 Other hyperlipidemia: Secondary | ICD-10-CM | POA: Diagnosis not present

## 2020-02-28 DIAGNOSIS — K219 Gastro-esophageal reflux disease without esophagitis: Secondary | ICD-10-CM | POA: Diagnosis not present

## 2020-02-28 DIAGNOSIS — M170 Bilateral primary osteoarthritis of knee: Secondary | ICD-10-CM | POA: Diagnosis not present

## 2020-05-02 DIAGNOSIS — E782 Mixed hyperlipidemia: Secondary | ICD-10-CM | POA: Diagnosis not present

## 2020-05-02 DIAGNOSIS — E7849 Other hyperlipidemia: Secondary | ICD-10-CM | POA: Diagnosis not present

## 2020-05-02 DIAGNOSIS — Z7289 Other problems related to lifestyle: Secondary | ICD-10-CM | POA: Diagnosis not present

## 2020-05-02 DIAGNOSIS — K2100 Gastro-esophageal reflux disease with esophagitis, without bleeding: Secondary | ICD-10-CM | POA: Diagnosis not present

## 2020-05-02 DIAGNOSIS — E7800 Pure hypercholesterolemia, unspecified: Secondary | ICD-10-CM | POA: Diagnosis not present

## 2020-05-02 DIAGNOSIS — R7301 Impaired fasting glucose: Secondary | ICD-10-CM | POA: Diagnosis not present

## 2020-05-09 DIAGNOSIS — Z1212 Encounter for screening for malignant neoplasm of rectum: Secondary | ICD-10-CM | POA: Diagnosis not present

## 2020-05-09 DIAGNOSIS — J45909 Unspecified asthma, uncomplicated: Secondary | ICD-10-CM | POA: Diagnosis not present

## 2020-05-09 DIAGNOSIS — G629 Polyneuropathy, unspecified: Secondary | ICD-10-CM | POA: Diagnosis not present

## 2020-05-09 DIAGNOSIS — R7301 Impaired fasting glucose: Secondary | ICD-10-CM | POA: Diagnosis not present

## 2020-05-09 DIAGNOSIS — Z1389 Encounter for screening for other disorder: Secondary | ICD-10-CM | POA: Diagnosis not present

## 2020-05-09 DIAGNOSIS — Z0001 Encounter for general adult medical examination with abnormal findings: Secondary | ICD-10-CM | POA: Diagnosis not present

## 2020-06-21 DIAGNOSIS — M810 Age-related osteoporosis without current pathological fracture: Secondary | ICD-10-CM | POA: Diagnosis not present

## 2020-06-21 DIAGNOSIS — Z13820 Encounter for screening for osteoporosis: Secondary | ICD-10-CM | POA: Diagnosis not present

## 2020-06-27 DIAGNOSIS — E7849 Other hyperlipidemia: Secondary | ICD-10-CM | POA: Diagnosis not present

## 2020-06-27 DIAGNOSIS — M170 Bilateral primary osteoarthritis of knee: Secondary | ICD-10-CM | POA: Diagnosis not present

## 2020-06-27 DIAGNOSIS — K219 Gastro-esophageal reflux disease without esophagitis: Secondary | ICD-10-CM | POA: Diagnosis not present

## 2020-07-28 DIAGNOSIS — M170 Bilateral primary osteoarthritis of knee: Secondary | ICD-10-CM | POA: Diagnosis not present

## 2020-07-28 DIAGNOSIS — E7849 Other hyperlipidemia: Secondary | ICD-10-CM | POA: Diagnosis not present

## 2020-07-28 DIAGNOSIS — K219 Gastro-esophageal reflux disease without esophagitis: Secondary | ICD-10-CM | POA: Diagnosis not present

## 2020-08-27 DIAGNOSIS — E7849 Other hyperlipidemia: Secondary | ICD-10-CM | POA: Diagnosis not present

## 2020-08-27 DIAGNOSIS — M170 Bilateral primary osteoarthritis of knee: Secondary | ICD-10-CM | POA: Diagnosis not present

## 2020-08-27 DIAGNOSIS — K219 Gastro-esophageal reflux disease without esophagitis: Secondary | ICD-10-CM | POA: Diagnosis not present

## 2020-08-29 DIAGNOSIS — K2100 Gastro-esophageal reflux disease with esophagitis, without bleeding: Secondary | ICD-10-CM | POA: Diagnosis not present

## 2020-08-29 DIAGNOSIS — E7849 Other hyperlipidemia: Secondary | ICD-10-CM | POA: Diagnosis not present

## 2020-08-29 DIAGNOSIS — R7301 Impaired fasting glucose: Secondary | ICD-10-CM | POA: Diagnosis not present

## 2020-08-29 DIAGNOSIS — E7800 Pure hypercholesterolemia, unspecified: Secondary | ICD-10-CM | POA: Diagnosis not present

## 2020-08-29 DIAGNOSIS — E782 Mixed hyperlipidemia: Secondary | ICD-10-CM | POA: Diagnosis not present

## 2020-09-04 DIAGNOSIS — J45909 Unspecified asthma, uncomplicated: Secondary | ICD-10-CM | POA: Diagnosis not present

## 2020-09-04 DIAGNOSIS — E7849 Other hyperlipidemia: Secondary | ICD-10-CM | POA: Diagnosis not present

## 2020-09-04 DIAGNOSIS — H9190 Unspecified hearing loss, unspecified ear: Secondary | ICD-10-CM | POA: Diagnosis not present

## 2020-09-04 DIAGNOSIS — R42 Dizziness and giddiness: Secondary | ICD-10-CM | POA: Diagnosis not present

## 2020-09-04 DIAGNOSIS — R7301 Impaired fasting glucose: Secondary | ICD-10-CM | POA: Diagnosis not present

## 2020-09-04 DIAGNOSIS — G629 Polyneuropathy, unspecified: Secondary | ICD-10-CM | POA: Diagnosis not present

## 2020-10-28 DIAGNOSIS — K219 Gastro-esophageal reflux disease without esophagitis: Secondary | ICD-10-CM | POA: Diagnosis not present

## 2020-10-28 DIAGNOSIS — M170 Bilateral primary osteoarthritis of knee: Secondary | ICD-10-CM | POA: Diagnosis not present

## 2020-10-28 DIAGNOSIS — E7849 Other hyperlipidemia: Secondary | ICD-10-CM | POA: Diagnosis not present

## 2020-12-28 DIAGNOSIS — K219 Gastro-esophageal reflux disease without esophagitis: Secondary | ICD-10-CM | POA: Diagnosis not present

## 2020-12-28 DIAGNOSIS — M170 Bilateral primary osteoarthritis of knee: Secondary | ICD-10-CM | POA: Diagnosis not present

## 2021-01-01 DIAGNOSIS — E7800 Pure hypercholesterolemia, unspecified: Secondary | ICD-10-CM | POA: Diagnosis not present

## 2021-01-01 DIAGNOSIS — E7849 Other hyperlipidemia: Secondary | ICD-10-CM | POA: Diagnosis not present

## 2021-01-01 DIAGNOSIS — K2100 Gastro-esophageal reflux disease with esophagitis, without bleeding: Secondary | ICD-10-CM | POA: Diagnosis not present

## 2021-01-01 DIAGNOSIS — E782 Mixed hyperlipidemia: Secondary | ICD-10-CM | POA: Diagnosis not present

## 2021-01-01 DIAGNOSIS — R7301 Impaired fasting glucose: Secondary | ICD-10-CM | POA: Diagnosis not present

## 2021-01-02 DIAGNOSIS — R509 Fever, unspecified: Secondary | ICD-10-CM | POA: Diagnosis not present

## 2021-01-02 DIAGNOSIS — R059 Cough, unspecified: Secondary | ICD-10-CM | POA: Diagnosis not present

## 2021-01-09 DIAGNOSIS — E7849 Other hyperlipidemia: Secondary | ICD-10-CM | POA: Diagnosis not present

## 2021-01-09 DIAGNOSIS — G629 Polyneuropathy, unspecified: Secondary | ICD-10-CM | POA: Diagnosis not present

## 2021-01-09 DIAGNOSIS — J45909 Unspecified asthma, uncomplicated: Secondary | ICD-10-CM | POA: Diagnosis not present

## 2021-01-09 DIAGNOSIS — H9190 Unspecified hearing loss, unspecified ear: Secondary | ICD-10-CM | POA: Diagnosis not present

## 2021-01-09 DIAGNOSIS — Z23 Encounter for immunization: Secondary | ICD-10-CM | POA: Diagnosis not present

## 2021-01-09 DIAGNOSIS — R079 Chest pain, unspecified: Secondary | ICD-10-CM | POA: Diagnosis not present

## 2021-01-11 ENCOUNTER — Telehealth: Payer: Self-pay

## 2021-01-11 NOTE — Telephone Encounter (Signed)
NOTES SCANNED TO REFERRAL 

## 2021-02-12 DIAGNOSIS — J329 Chronic sinusitis, unspecified: Secondary | ICD-10-CM | POA: Diagnosis not present

## 2021-02-12 DIAGNOSIS — J40 Bronchitis, not specified as acute or chronic: Secondary | ICD-10-CM | POA: Diagnosis not present

## 2021-04-22 DIAGNOSIS — Z1231 Encounter for screening mammogram for malignant neoplasm of breast: Secondary | ICD-10-CM | POA: Diagnosis not present

## 2021-07-04 DIAGNOSIS — R7301 Impaired fasting glucose: Secondary | ICD-10-CM | POA: Diagnosis not present

## 2021-07-04 DIAGNOSIS — E782 Mixed hyperlipidemia: Secondary | ICD-10-CM | POA: Diagnosis not present

## 2021-07-17 DIAGNOSIS — M79641 Pain in right hand: Secondary | ICD-10-CM | POA: Diagnosis not present

## 2021-07-17 DIAGNOSIS — M25531 Pain in right wrist: Secondary | ICD-10-CM | POA: Diagnosis not present

## 2021-07-22 DIAGNOSIS — R079 Chest pain, unspecified: Secondary | ICD-10-CM | POA: Diagnosis not present

## 2021-07-22 DIAGNOSIS — G629 Polyneuropathy, unspecified: Secondary | ICD-10-CM | POA: Diagnosis not present

## 2021-07-22 DIAGNOSIS — R7301 Impaired fasting glucose: Secondary | ICD-10-CM | POA: Diagnosis not present

## 2021-07-22 DIAGNOSIS — R42 Dizziness and giddiness: Secondary | ICD-10-CM | POA: Diagnosis not present

## 2021-07-22 DIAGNOSIS — E7849 Other hyperlipidemia: Secondary | ICD-10-CM | POA: Diagnosis not present

## 2021-07-22 DIAGNOSIS — S62109A Fracture of unspecified carpal bone, unspecified wrist, initial encounter for closed fracture: Secondary | ICD-10-CM | POA: Diagnosis not present

## 2021-07-22 DIAGNOSIS — H9190 Unspecified hearing loss, unspecified ear: Secondary | ICD-10-CM | POA: Diagnosis not present

## 2021-07-22 DIAGNOSIS — J45909 Unspecified asthma, uncomplicated: Secondary | ICD-10-CM | POA: Diagnosis not present

## 2021-07-31 DIAGNOSIS — M25531 Pain in right wrist: Secondary | ICD-10-CM | POA: Diagnosis not present

## 2021-07-31 DIAGNOSIS — S62316A Displaced fracture of base of fifth metacarpal bone, right hand, initial encounter for closed fracture: Secondary | ICD-10-CM | POA: Diagnosis not present

## 2021-07-31 DIAGNOSIS — W19XXXA Unspecified fall, initial encounter: Secondary | ICD-10-CM | POA: Diagnosis not present

## 2021-07-31 DIAGNOSIS — R296 Repeated falls: Secondary | ICD-10-CM | POA: Diagnosis not present

## 2021-07-31 DIAGNOSIS — S52514A Nondisplaced fracture of right radial styloid process, initial encounter for closed fracture: Secondary | ICD-10-CM | POA: Diagnosis not present

## 2021-08-07 DIAGNOSIS — S52571D Other intraarticular fracture of lower end of right radius, subsequent encounter for closed fracture with routine healing: Secondary | ICD-10-CM | POA: Diagnosis not present

## 2021-08-07 DIAGNOSIS — S62316D Displaced fracture of base of fifth metacarpal bone, right hand, subsequent encounter for fracture with routine healing: Secondary | ICD-10-CM | POA: Diagnosis not present

## 2021-08-07 DIAGNOSIS — S52572D Other intraarticular fracture of lower end of left radius, subsequent encounter for closed fracture with routine healing: Secondary | ICD-10-CM | POA: Diagnosis not present

## 2021-08-07 DIAGNOSIS — S62317D Displaced fracture of base of fifth metacarpal bone. left hand, subsequent encounter for fracture with routine healing: Secondary | ICD-10-CM | POA: Diagnosis not present

## 2021-08-16 DIAGNOSIS — J209 Acute bronchitis, unspecified: Secondary | ICD-10-CM | POA: Diagnosis not present

## 2021-08-16 DIAGNOSIS — R062 Wheezing: Secondary | ICD-10-CM | POA: Diagnosis not present

## 2021-08-27 DIAGNOSIS — S52571D Other intraarticular fracture of lower end of right radius, subsequent encounter for closed fracture with routine healing: Secondary | ICD-10-CM | POA: Diagnosis not present

## 2021-08-27 DIAGNOSIS — S62316D Displaced fracture of base of fifth metacarpal bone, right hand, subsequent encounter for fracture with routine healing: Secondary | ICD-10-CM | POA: Diagnosis not present

## 2021-09-03 DIAGNOSIS — I10 Essential (primary) hypertension: Secondary | ICD-10-CM | POA: Diagnosis not present

## 2021-09-03 DIAGNOSIS — S52571D Other intraarticular fracture of lower end of right radius, subsequent encounter for closed fracture with routine healing: Secondary | ICD-10-CM | POA: Diagnosis not present

## 2021-09-03 DIAGNOSIS — S62316D Displaced fracture of base of fifth metacarpal bone, right hand, subsequent encounter for fracture with routine healing: Secondary | ICD-10-CM | POA: Diagnosis not present

## 2021-09-03 DIAGNOSIS — Z9181 History of falling: Secondary | ICD-10-CM | POA: Diagnosis not present

## 2021-09-12 DIAGNOSIS — Z9181 History of falling: Secondary | ICD-10-CM | POA: Diagnosis not present

## 2021-09-12 DIAGNOSIS — S62316D Displaced fracture of base of fifth metacarpal bone, right hand, subsequent encounter for fracture with routine healing: Secondary | ICD-10-CM | POA: Diagnosis not present

## 2021-09-12 DIAGNOSIS — S52571D Other intraarticular fracture of lower end of right radius, subsequent encounter for closed fracture with routine healing: Secondary | ICD-10-CM | POA: Diagnosis not present

## 2021-09-17 ENCOUNTER — Emergency Department (HOSPITAL_COMMUNITY)
Admission: EM | Admit: 2021-09-17 | Discharge: 2021-09-17 | Disposition: A | Discharge status: Home / Self Care | Payer: Medicare Other | Attending: Emergency Medicine | Admitting: Emergency Medicine

## 2021-09-17 ENCOUNTER — Other Ambulatory Visit: Payer: Self-pay

## 2021-09-17 ENCOUNTER — Encounter (HOSPITAL_COMMUNITY): Payer: Self-pay | Admitting: *Deleted

## 2021-09-17 ENCOUNTER — Emergency Department (HOSPITAL_COMMUNITY): Payer: Medicare Other

## 2021-09-17 DIAGNOSIS — R0602 Shortness of breath: Secondary | ICD-10-CM | POA: Diagnosis not present

## 2021-09-17 DIAGNOSIS — I10 Essential (primary) hypertension: Secondary | ICD-10-CM | POA: Diagnosis not present

## 2021-09-17 DIAGNOSIS — R079 Chest pain, unspecified: Secondary | ICD-10-CM | POA: Diagnosis not present

## 2021-09-17 DIAGNOSIS — Z90710 Acquired absence of both cervix and uterus: Secondary | ICD-10-CM | POA: Diagnosis not present

## 2021-09-17 DIAGNOSIS — Z87891 Personal history of nicotine dependence: Secondary | ICD-10-CM | POA: Diagnosis not present

## 2021-09-17 DIAGNOSIS — R42 Dizziness and giddiness: Secondary | ICD-10-CM | POA: Insufficient documentation

## 2021-09-17 DIAGNOSIS — J811 Chronic pulmonary edema: Secondary | ICD-10-CM | POA: Diagnosis not present

## 2021-09-17 DIAGNOSIS — I200 Unstable angina: Secondary | ICD-10-CM | POA: Diagnosis not present

## 2021-09-17 DIAGNOSIS — R072 Precordial pain: Secondary | ICD-10-CM | POA: Diagnosis not present

## 2021-09-17 DIAGNOSIS — E7849 Other hyperlipidemia: Secondary | ICD-10-CM | POA: Diagnosis not present

## 2021-09-17 LAB — BASIC METABOLIC PANEL
Anion gap: 6 (ref 5–15)
BUN: 17 mg/dL (ref 8–23)
CO2: 28 mmol/L (ref 22–32)
Calcium: 8.7 mg/dL — ABNORMAL LOW (ref 8.9–10.3)
Chloride: 104 mmol/L (ref 98–111)
Creatinine, Ser: 0.52 mg/dL (ref 0.44–1.00)
GFR, Estimated: >60 mL/min (ref >60)
Glucose, Bld: 85 mg/dL (ref 70–99)
Potassium: 4.3 mmol/L (ref 3.5–5.1)
Sodium: 138 mmol/L (ref 135–145)

## 2021-09-17 LAB — CBC
HCT: 40 % (ref 36.0–46.0)
Hemoglobin: 13.5 g/dL (ref 12.0–15.0)
MCH: 32.5 pg (ref 26.0–34.0)
MCHC: 33.8 g/dL (ref 30.0–36.0)
MCV: 96.4 fL (ref 80.0–100.0)
Platelets: 92 10*3/uL — ABNORMAL LOW (ref 150–400)
RBC: 4.15 MIL/uL (ref 3.87–5.11)
RDW: 14.3 % (ref 11.5–15.5)
WBC: 4.1 10*3/uL (ref 4.0–10.5)
nRBC: 0 % (ref 0.0–0.2)

## 2021-09-17 LAB — TROPONIN I (HIGH SENSITIVITY)
Troponin I (High Sensitivity): 3 ng/L (ref <18)
Troponin I (High Sensitivity): 3 ng/L (ref <18)

## 2021-09-17 NOTE — ED Triage Notes (Signed)
Pt c/o chest pain with bilateral leg numbness and dizziness for the last couple of weeks  Pt states when she takes 2 nitro the chest pain gets better

## 2021-09-17 NOTE — ED Triage Notes (Signed)
Pt presents with 2 week hx of chest pain that started while walking. Pt reports associated dizziness and ShOB. Pt sent by PCP to ED.

## 2021-09-17 NOTE — Discharge Instructions (Signed)
Work-up today for the symptoms without evidence of any acute cardiac event.  But close follow-up with cardiology would be important.  Contact Dr. Verna Czech office for follow-up.  Information provided above.  Return for any new or worse symptoms.

## 2021-09-17 NOTE — ED Notes (Signed)
ED Provider at bedside. 

## 2021-09-17 NOTE — ED Provider Notes (Signed)
[facility] EMERGENCY DEPARTMENT Provider Note   CSN: [id number] Arrival date & time: 09/17/21  1445     History  Chief Complaint  Patient presents with   Chest Pain    Jenna Sherman is a 71 y.o. female.  Patient sent in from primary care doctor's office for concern of abnormal EKG and chest pain somewhat exertional for the past 2 weeks.  Patient also reports some shortness of breath with it and some associated dizziness.    Patient is followed by Dr. Wyline Mood from cardiology.  Past medical history significant for elevated triglycerides asthma chronic back pain morbid obesity history of vertigo hypertension peripheral neuropathy.  Past surgical history total abdominal hysterectomy.  Former smoker quit in 1970.  Chart review seems to imply that patient has not been seen by cardiology recently.  Did have an echocardiogram in 2005 with normal left ventricular function.  And some mild mitral regurg.  Do not see anything about stress test or any cardiac cath in her history.  Patient states that she has angina.  Do not see nitroglycerin listed as any of her medications.         Home Medications Prior to Admission medications   Medication Sig Start Date End Date Taking? Authorizing Provider  cetirizine (ZYRTEC) 10 MG tablet Take 10 mg by mouth daily.    [provider]  clonazePAM (KLONOPIN) 0.5 MG tablet Take 0.5 mg by mouth every 8 (eight) hours as needed for anxiety.    [provider]  cyclobenzaprine (FLEXERIL) 10 MG tablet Take 10 mg by mouth at bedtime.    [provider]  diclofenac (VOLTAREN) 75 MG EC tablet Take 75 mg by mouth 2 (two) times daily.    [provider]  escitalopram (LEXAPRO) 10 MG tablet Take 10 mg by mouth daily.    [provider]  gabapentin (NEURONTIN) 300 MG capsule Take 300 mg by mouth at bedtime.    [provider]  IMDUR 30 MG 24 hr tablet TAKE 1 TABLET ONCE DAILY 10/14/10   de Melanie Crazier, MD       Allergies    Patient has no known allergies.    Review of Systems   Review of Systems  Constitutional:  Negative for chills and fever.  HENT:  Positive for hearing loss. Negative for ear pain and sore throat.   Eyes:  Negative for pain and visual disturbance.  Respiratory:  Negative for cough and shortness of breath.   Cardiovascular:  Positive for chest pain. Negative for palpitations.  Gastrointestinal:  Negative for abdominal pain and vomiting.  Genitourinary:  Negative for dysuria and hematuria.  Musculoskeletal:  Negative for arthralgias and back pain.  Skin:  Negative for color change and rash.  Neurological:  Negative for seizures and syncope.  All other systems reviewed and are negative.   Physical Exam Updated Vital Signs BP (!) 186/69   Pulse 60   Temp 98.2 F (36.8 C) (Oral)   Resp 18   Ht 1.448 m (4\' 9" )   Wt 116.6 kg   SpO2 96%   BMI 55.61 kg/m  Physical Exam Vitals and nursing note reviewed.  Constitutional:      General: She is not in acute distress.    Appearance: She is well-developed. She is obese.  HENT:     Head: Normocephalic and atraumatic.  Eyes:     Extraocular Movements: Extraocular movements intact.     Conjunctiva/sclera: Conjunctivae normal.     Pupils:  Pupils are equal, round, and reactive to light.  Cardiovascular:     Rate and Rhythm: Normal rate and regular rhythm.     Heart sounds: No murmur heard. Pulmonary:     Effort: Pulmonary effort is normal. No respiratory distress.     Breath sounds: Normal breath sounds.  Abdominal:     Palpations: Abdomen is soft.     Tenderness: There is no abdominal tenderness.  Musculoskeletal:        General: No swelling.     Cervical back: Neck supple.  Skin:    General: Skin is warm and dry.     Capillary Refill: Capillary refill takes less than 2 seconds.  Neurological:     General: No focal deficit present.     Mental Status: She is alert and oriented to person, place, and time.   Psychiatric:        Mood and Affect: Mood normal.     ED Results / Procedures / Treatments   Labs (all labs ordered are listed, but only abnormal results are displayed) Labs Reviewed  BASIC METABOLIC PANEL  CBC  TROPONIN I (HIGH SENSITIVITY)    EKG EKG Interpretation  Date/Time:  Tuesday September 17 2021 15:09:38 EDT Ventricular Rate:  61 PR Interval:  154 QRS Duration: 94 QT Interval:  392 QTC Calculation: 394 R Axis:   -28 Text Interpretation: Normal sinus rhythm Low voltage QRS Incomplete right bundle branch block Borderline ECG No previous ECGs available Confirmed by Vanetta Mulders [id number]) on 09/17/2021 3:27:18 PM  Radiology No results found.  Procedures Procedures    Medications Ordered in ED Medications - No data to display  ED Course/ Medical Decision Making/ A&P                           Medical Decision Making Amount and/or Complexity of Data Reviewed Labs: ordered. Radiology: ordered.  Patient's chest pain history has been ongoing for a while.  There was anything unique to this patient has a lot of exertional shortness of breath and gets chest discomfort at times.  Patient has nitroglycerin at home.  Patient has been told she has some angina.  Followed by cardiology is had echocardiograms done but best we can tell has not had a stress test.  Pertinent patient has not had a cardiac catheter or any stents placed.  Sent in by primary care doctor with concerns for EKG changes and of course her shortness of breath with exertion and chest pain.  Patient's troponin stable at 3.  Patient completely asymptomatic at rest.  Patient can be discharged to follow-up with cardiology.  She is followed by Dr. Wyline Mood.  CBC no leukocytosis hemoglobin good chest x-ray mild cardiomegaly with central pulmonary vascular congestion.  Patient without any hypoxia.  Basic metabolic panel normal and renal function is normal.  Final Clinical Impression(s) / ED Diagnoses Final  diagnoses:  Precordial pain    Rx / DC Orders ED Discharge Orders     None         Vanetta Mulders, MD 09/17/21 2059

## 2021-09-17 NOTE — ED Provider Notes (Signed)
[facility] from [facility] who sent the patient to the emergency department because of chest pain while walking between the waiting room and the office examination room.  She had presented for a blood pressure check was noted to be 188/88, chest pain was present, history of unstable angina, EKG with possible changes in the office but not ST elevation.  Primary care physician wants patient to be admitted and evaluated for unstable angina with cardiology consultation while inpatient.   Eber Hong, MD 09/17/21 [phone]

## 2021-09-23 DIAGNOSIS — S52571D Other intraarticular fracture of lower end of right radius, subsequent encounter for closed fracture with routine healing: Secondary | ICD-10-CM | POA: Diagnosis not present

## 2021-10-21 ENCOUNTER — Encounter: Payer: Self-pay | Admitting: Cardiology

## 2021-10-21 ENCOUNTER — Ambulatory Visit: Payer: Medicare Other | Admitting: Cardiology

## 2021-10-21 ENCOUNTER — Ambulatory Visit (INDEPENDENT_AMBULATORY_CARE_PROVIDER_SITE_OTHER): Payer: Medicare Other

## 2021-10-21 VITALS — BP 138/70 | HR 70 | Ht 59.0 in | Wt 264.8 lb

## 2021-10-21 DIAGNOSIS — R0789 Other chest pain: Secondary | ICD-10-CM

## 2021-10-21 DIAGNOSIS — R0602 Shortness of breath: Secondary | ICD-10-CM

## 2021-10-21 DIAGNOSIS — S52571D Other intraarticular fracture of lower end of right radius, subsequent encounter for closed fracture with routine healing: Secondary | ICD-10-CM | POA: Diagnosis not present

## 2021-10-21 LAB — ECHOCARDIOGRAM COMPLETE
AR max vel: 2.42 cm2
AV Area VTI: 2.71 cm2
AV Area mean vel: 2.37 cm2
AV Mean grad: 4.3 mmHg
AV Peak grad: 8.7 mmHg
Ao pk vel: 1.48 m/s
Area-P 1/2: 2.08 cm2
Calc EF: 76.1 %
Height: 59 in
S' Lateral: 2.44 cm
Single Plane A2C EF: 73.5 %
Single Plane A4C EF: 77.3 %
Weight: 4236.8 oz

## 2021-10-21 MED ORDER — FUROSEMIDE 20 MG PO TABS: 20.0000 mg | ORAL_TABLET | Freq: Every day | ORAL | 1 refills | Status: DC

## 2021-10-21 NOTE — Patient Instructions (Addendum)
Medication Instructions:  Your physician has recommended you make the following change in your medication:  Start lasix 20 mg once a day Continue all other medications as prescribed  Labwork:  In 2 weeks (UNCR) BMET BNP MAG  Testing/Procedures: Your physician has requested that you have an echocardiogram. Echocardiography is a painless test that uses sound waves to create images of your heart. It provides your doctor with information about the size and shape of your heart and how well your heart's chambers and valves are working. This procedure takes approximately one hour. There are no restrictions for this procedure.   Follow-Up:  Your physician recommends that you schedule a follow-up appointment in: Follow Up Pending  Any Other Special Instructions Will Be Listed Below (If Applicable).  If you need a refill on your cardiac medications before your next appointment, please call your pharmacy.

## 2021-10-21 NOTE — Progress Notes (Signed)
Clinical Summary Jenna Sherman is a 71 y.o.female seen as new patient. Last seen in our office in 2015   1. Chest pain - started approx 1 year ago. Tightness in chest down left arm, 7/10. Feels nauseous, sweaty. + SOB. Can occur at rest or with exertion. Lasts for 1-2 minutes. Occurs twice a week. Increasing in frequency, increasing in severity. Not positional. DOE at < 1/2 block, which is worst than before.  - prior stress test 2008, no ischemia   CAD risk factors: HL, HTN, father MI age 76  - did not complete lexiscan in 2015 and did not follow back up with [place]  09/17/21 ER visit with chest pain - trop neg x 2. EKG SR without acute iscehmic changes. CXR central congestion   - symptoms x 3 months - sharp pain, left sided but can radiate to right and in between shoulder blades, can have some associated pressure. Has some nausea, dizziness. Pain lasts about 2-3 minutes.  DOE with short distances, just room to room. Pain can occur at rest or with activity - some recent LE edema, some recent orthopnea.  - CAD risk factors: HTN, borderline DM2, father MI age 23, son died MI age 39.       2. COPD -compliant with inhalers  Past Medical History:  Diagnosis Date   Asthma    Chronic back pain    Depression    History of uterine cancer    History of vertigo    Hypertension    Normal LVF (EF 65%) by 2D Echo, August 2005. Mild left ventricular hypertrophy; mild mitral regurgitation   Hypertriglyceridemia    Morbid obesity (HCC)    Peripheral neuropathy      Allergies  Allergen Reactions   Ivp Dye [Iodinated Contrast Media]      Current Outpatient Medications  Medication Sig Dispense Refill   albuterol (PROVENTIL) (2.5 MG/3ML) 0.083% nebulizer solution Take 2.5 mg by nebulization every 4 (four) hours as needed for wheezing or shortness of breath.     clonazePAM (KLONOPIN) 0.5 MG tablet Take 0.5 mg by mouth every 8 (eight) hours as needed for anxiety.     cyclobenzaprine  (FLEXERIL) 10 MG tablet Take 15 mg by mouth at bedtime.     diclofenac (VOLTAREN) 75 MG EC tablet Take 75 mg by mouth 2 (two) times daily.     escitalopram (LEXAPRO) 20 MG tablet Take 20 mg by mouth at bedtime.     fluticasone furoate-vilanterol (BREO ELLIPTA) 200-25 MCG/ACT AEPB Inhale 1 puff into the lungs daily.     gabapentin (NEURONTIN) 300 MG capsule Take 600 mg by mouth at bedtime.     No current facility-administered medications for this visit.     Past Surgical History:  Procedure Laterality Date   CARPAL TUNNEL RELEASE  07/30/2006   REPLACEMENT TOTAL KNEE Left    TOTAL ABDOMINAL HYSTERECTOMY W/ BILATERAL SALPINGOOPHORECTOMY     uterine cancer     Allergies  Allergen Reactions   Ivp Dye [Iodinated Contrast Media]       Family History  Problem Relation Age of Onset   Heart attack Brother 23   Heart attack Father 5   CAD Brother    CAD Father    Heart disease Sister        x's 2     Social History Jenna Sherman reports that she quit smoking about 53 years ago. Her smoking use included cigarettes. She has never used smokeless tobacco. [name] reports that she does not currently use alcohol.   Review of Systems CONSTITUTIONAL: No weight loss, fever, chills, weakness or fatigue.  HEENT: Eyes: No visual loss, blurred vision, double vision or yellow sclerae.No hearing loss, sneezing, congestion, runny nose or sore throat.  SKIN: No rash or itching.  CARDIOVASCULAR: per hpi RESPIRATORY: No shortness of breath, cough or sputum.  GASTROINTESTINAL: No anorexia, nausea, vomiting or diarrhea. No abdominal pain or blood.  GENITOURINARY: No burning on urination, no polyuria NEUROLOGICAL: No headache, dizziness, syncope, paralysis, ataxia, numbness or tingling in the extremities. No change in bowel or bladder control.  MUSCULOSKELETAL: No muscle, back pain, joint pain or stiffness.  LYMPHATICS: No enlarged nodes. No history of splenectomy.  PSYCHIATRIC: No history of  depression or anxiety.  ENDOCRINOLOGIC: No reports of sweating, cold or heat intolerance. No polyuria or polydipsia.  Marland Kitchen   Physical Examination Today's Vitals   10/21/21 1029  BP: 138/70  Pulse: 70  SpO2: 95%  Weight: 264 lb 12.8 oz (120.1 kg)  Height: 4\' 11"  (1.499 m)   Body mass index is 53.48 kg/m.  Gen: resting comfortably, no acute distress HEENT: no scleral icterus, pupils equal round and reactive, no palptable cervical adenopathy,  CV: RRR, 3/6 systolic murmur rusb, no jvd Resp: Clear to auscultation bilaterally GI: abdomen is soft, non-tender, non-distended, normal bowel sounds, no hepatosplenomegaly MSK: extremities are warm, no edema.  Skin: warm, no rash Neuro:  no focal deficits Psych: appropriate affect      Assessment and Plan    1. Chest pain/DOE/LE edema - multiple CAD risk factors including family history of early CAD/MI - with cardiac murmur and LE edema will start workup with echo - pending results would consider likely CTA vs PET stress. Does have dye allergy would require prep, BMI is 53 - start lasix 20mg  daily, in 2 weeks obtain bmet/mg/bnp     , M.D.

## 2021-11-04 DIAGNOSIS — R0600 Dyspnea, unspecified: Secondary | ICD-10-CM | POA: Diagnosis not present

## 2021-11-04 DIAGNOSIS — R0602 Shortness of breath: Secondary | ICD-10-CM | POA: Diagnosis not present

## 2021-11-04 DIAGNOSIS — Z79899 Other long term (current) drug therapy: Secondary | ICD-10-CM | POA: Diagnosis not present

## 2021-11-04 DIAGNOSIS — R600 Localized edema: Secondary | ICD-10-CM | POA: Diagnosis not present

## 2021-11-13 DIAGNOSIS — E7849 Other hyperlipidemia: Secondary | ICD-10-CM | POA: Diagnosis not present

## 2021-11-13 DIAGNOSIS — K2100 Gastro-esophageal reflux disease with esophagitis, without bleeding: Secondary | ICD-10-CM | POA: Diagnosis not present

## 2021-11-13 DIAGNOSIS — R7301 Impaired fasting glucose: Secondary | ICD-10-CM | POA: Diagnosis not present

## 2021-11-14 ENCOUNTER — Telehealth: Payer: Self-pay | Admitting: *Deleted

## 2021-11-14 NOTE — Telephone Encounter (Signed)
Lesle Chris, LPN  8/84/1660  5:09 PM EDT Back to Top    Audie (husband) notified, copy to pcp.  States she is doing pretty good now, will call back if has continued chest pain/sob.

## 2021-11-14 NOTE — Telephone Encounter (Signed)
-----   Message from Antoine Poche, MD sent at 11/04/2021  1:28 PM EDT ----- Normal echo. We may consider a stress test if ongoing symptoms. Stress test is a little complicated given her weight which can affect the accuracy of some tests. Best option would be a cardiac stress PET scan in [place]. If symptosm improves then would just monitor for now, if ongoing chest pain/SOB would look to arrange PET  Dominga Ferry MD

## 2021-11-18 ENCOUNTER — Telehealth: Payer: Self-pay | Admitting: *Deleted

## 2021-11-18 NOTE — Telephone Encounter (Signed)
Lesle Chris, LPN  8/00/3491  3:18 PM EDT Back to Top    Husband Ardelle Balls) notified.  Copy to pcp.

## 2021-11-18 NOTE — Telephone Encounter (Signed)
-----   Message from Antoine Poche, MD sent at 11/05/2021  1:29 PM EDT ----- Labs overall look good, do suggest some mild fluid retention.which should improve with lasix.  Dominga Ferry MD

## 2021-11-20 DIAGNOSIS — R7301 Impaired fasting glucose: Secondary | ICD-10-CM | POA: Diagnosis not present

## 2021-11-20 DIAGNOSIS — G629 Polyneuropathy, unspecified: Secondary | ICD-10-CM | POA: Diagnosis not present

## 2021-11-20 DIAGNOSIS — J45909 Unspecified asthma, uncomplicated: Secondary | ICD-10-CM | POA: Diagnosis not present

## 2021-11-20 DIAGNOSIS — Z0001 Encounter for general adult medical examination with abnormal findings: Secondary | ICD-10-CM | POA: Diagnosis not present

## 2021-11-20 DIAGNOSIS — E7849 Other hyperlipidemia: Secondary | ICD-10-CM | POA: Diagnosis not present

## 2021-11-20 DIAGNOSIS — R079 Chest pain, unspecified: Secondary | ICD-10-CM | POA: Diagnosis not present

## 2021-11-20 DIAGNOSIS — L0390 Cellulitis, unspecified: Secondary | ICD-10-CM | POA: Diagnosis not present

## 2021-11-20 DIAGNOSIS — Z23 Encounter for immunization: Secondary | ICD-10-CM | POA: Diagnosis not present

## 2021-11-20 DIAGNOSIS — H9190 Unspecified hearing loss, unspecified ear: Secondary | ICD-10-CM | POA: Diagnosis not present

## 2021-11-20 DIAGNOSIS — R42 Dizziness and giddiness: Secondary | ICD-10-CM | POA: Diagnosis not present

## 2021-12-05 ENCOUNTER — Encounter: Payer: Self-pay | Admitting: *Deleted

## 2021-12-09 DIAGNOSIS — J209 Acute bronchitis, unspecified: Secondary | ICD-10-CM | POA: Diagnosis not present

## 2021-12-09 DIAGNOSIS — H9190 Unspecified hearing loss, unspecified ear: Secondary | ICD-10-CM | POA: Diagnosis not present

## 2021-12-09 DIAGNOSIS — R062 Wheezing: Secondary | ICD-10-CM | POA: Diagnosis not present

## 2021-12-09 DIAGNOSIS — R030 Elevated blood-pressure reading, without diagnosis of hypertension: Secondary | ICD-10-CM | POA: Diagnosis not present

## 2021-12-19 DIAGNOSIS — M79605 Pain in left leg: Secondary | ICD-10-CM | POA: Diagnosis not present

## 2021-12-19 DIAGNOSIS — R600 Localized edema: Secondary | ICD-10-CM | POA: Diagnosis not present

## 2022-02-25 DIAGNOSIS — R609 Edema, unspecified: Secondary | ICD-10-CM | POA: Diagnosis not present

## 2022-02-25 DIAGNOSIS — J45909 Unspecified asthma, uncomplicated: Secondary | ICD-10-CM | POA: Diagnosis not present

## 2022-02-25 DIAGNOSIS — R030 Elevated blood-pressure reading, without diagnosis of hypertension: Secondary | ICD-10-CM | POA: Diagnosis not present

## 2022-02-25 DIAGNOSIS — B372 Candidiasis of skin and nail: Secondary | ICD-10-CM | POA: Diagnosis not present

## 2022-02-25 DIAGNOSIS — J069 Acute upper respiratory infection, unspecified: Secondary | ICD-10-CM | POA: Diagnosis not present

## 2022-03-19 DIAGNOSIS — K2100 Gastro-esophageal reflux disease with esophagitis, without bleeding: Secondary | ICD-10-CM | POA: Diagnosis not present

## 2022-03-19 DIAGNOSIS — E7849 Other hyperlipidemia: Secondary | ICD-10-CM | POA: Diagnosis not present

## 2022-03-19 DIAGNOSIS — R7301 Impaired fasting glucose: Secondary | ICD-10-CM | POA: Diagnosis not present

## 2022-03-25 DIAGNOSIS — R079 Chest pain, unspecified: Secondary | ICD-10-CM | POA: Diagnosis not present

## 2022-03-25 DIAGNOSIS — J45909 Unspecified asthma, uncomplicated: Secondary | ICD-10-CM | POA: Diagnosis not present

## 2022-03-25 DIAGNOSIS — R030 Elevated blood-pressure reading, without diagnosis of hypertension: Secondary | ICD-10-CM | POA: Diagnosis not present

## 2022-03-25 DIAGNOSIS — E7849 Other hyperlipidemia: Secondary | ICD-10-CM | POA: Diagnosis not present

## 2022-03-25 DIAGNOSIS — H9190 Unspecified hearing loss, unspecified ear: Secondary | ICD-10-CM | POA: Diagnosis not present

## 2022-03-25 DIAGNOSIS — R42 Dizziness and giddiness: Secondary | ICD-10-CM | POA: Diagnosis not present

## 2022-03-25 DIAGNOSIS — Z23 Encounter for immunization: Secondary | ICD-10-CM | POA: Diagnosis not present

## 2022-03-25 DIAGNOSIS — R609 Edema, unspecified: Secondary | ICD-10-CM | POA: Diagnosis not present

## 2022-03-25 DIAGNOSIS — R7301 Impaired fasting glucose: Secondary | ICD-10-CM | POA: Diagnosis not present

## 2022-03-25 DIAGNOSIS — G629 Polyneuropathy, unspecified: Secondary | ICD-10-CM | POA: Diagnosis not present

## 2022-04-21 DIAGNOSIS — M79606 Pain in leg, unspecified: Secondary | ICD-10-CM | POA: Diagnosis not present

## 2022-04-21 DIAGNOSIS — R230 Cyanosis: Secondary | ICD-10-CM | POA: Diagnosis not present

## 2022-05-13 ENCOUNTER — Other Ambulatory Visit: Payer: Self-pay | Admitting: Cardiology

## 2022-06-11 ENCOUNTER — Encounter: Payer: Self-pay | Admitting: Cardiology

## 2022-06-11 ENCOUNTER — Encounter: Payer: Self-pay | Admitting: *Deleted

## 2022-06-11 ENCOUNTER — Ambulatory Visit: Payer: Medicare Other | Attending: Cardiology | Admitting: Cardiology

## 2022-06-11 VITALS — BP 138/80 | HR 74 | Ht <= 58 in | Wt 274.2 lb

## 2022-06-11 DIAGNOSIS — R600 Localized edema: Secondary | ICD-10-CM | POA: Diagnosis not present

## 2022-06-11 DIAGNOSIS — R0602 Shortness of breath: Secondary | ICD-10-CM

## 2022-06-11 DIAGNOSIS — R0789 Other chest pain: Secondary | ICD-10-CM | POA: Diagnosis not present

## 2022-06-11 MED ORDER — FUROSEMIDE 40 MG PO TABS: 40.0000 mg | ORAL_TABLET | Freq: Every day | ORAL | 1 refills | Status: DC

## 2022-06-11 NOTE — Progress Notes (Signed)
Clinical Summary Ms. Mugg is a 72 y.o.female  1. Chest pain - started approx 1 year ago. Tightness in chest down left arm, 7/10. Feels nauseous, sweaty. + SOB. Can occur at rest or with exertion. Lasts for 1-2 minutes. Occurs twice a week. Increasing in frequency, increasing in severity. Not positional. DOE at < 1/2 block, which is worst than before.  - prior stress test 2008, no ischemia   CAD risk factors: HL, HTN, father MI age 68   - did not complete lexiscan in 2015 and did not follow back up with [place]   09/17/21 ER visit with chest pain - trop neg x 2. EKG SR without acute iscehmic changes. CXR central congestion     - symptoms x 3 months - sharp pain, left sided but can radiate to right and in between shoulder blades, can have some associated pressure. Has some nausea, dizziness. Pain lasts about 2-3 minutes.  DOE with short distances, just room to room. Pain can occur at rest or with activity - some recent LE edema, some recent orthopnea.  - CAD risk factors: HTN, borderline DM2, father MI age 78, son died MI age 35August 04, 2023 echo: LVEF [id number], indet diastolic, mild AV thickening without stenosis   -pain midchest, lasts abt 1.5 minutes. Spreads into shoulders. Can occur at rest or with activity, though worst with walking. +SOB - pain has progressed since our last visit    2. COPD -compliant with inhalers    Past Medical History:  Diagnosis Date   Asthma    Chronic back pain    Depression    History of uterine cancer    History of vertigo    Hypertension    Normal LVF (EF 65%) by 2D Echo, August 2005. Mild left ventricular hypertrophy; mild mitral regurgitation   Hypertriglyceridemia    Morbid obesity (HCC)    Peripheral neuropathy      Allergies  Allergen Reactions   Ivp Dye [Iodinated Contrast Media]      Current Outpatient Medications  Medication Sig Dispense Refill   albuterol (PROVENTIL) (2.5 MG/3ML) 0.083% nebulizer solution Take 2.5 mg  by nebulization every 4 (four) hours as needed for wheezing or shortness of breath.     clonazePAM (KLONOPIN) 0.5 MG tablet Take 0.5 mg by mouth every 8 (eight) hours as needed for anxiety.     cyclobenzaprine (FLEXERIL) 10 MG tablet Take 15 mg by mouth at bedtime.     diclofenac (VOLTAREN) 75 MG EC tablet Take 75 mg by mouth 2 (two) times daily.     escitalopram (LEXAPRO) 20 MG tablet Take 20 mg by mouth at bedtime.     fluticasone furoate-vilanterol (BREO ELLIPTA) 200-25 MCG/ACT AEPB Inhale 1 puff into the lungs daily.     furosemide (LASIX) 20 MG tablet TAKE ONE TABLET BY MOUTH ONCE DAILY 90 tablet 1   gabapentin (NEURONTIN) 300 MG capsule Take 600 mg by mouth at bedtime.     meclizine (ANTIVERT) 25 MG tablet Take 25 mg by mouth 3 (three) times daily as needed for dizziness.     metoprolol succinate (TOPROL-XL) 50 MG 24 hr tablet Take 50 mg by mouth daily.     nitroGLYCERIN (NITROSTAT) 0.4 MG SL tablet Place 0.4 mg under the tongue every 5 (five) minutes x 3 doses as needed for chest pain (if no relief after 3rd dose, proceed to ED for evalution or call 911).     No  current facility-administered medications for this visit.     Past Surgical History:  Procedure Laterality Date   CARPAL TUNNEL RELEASE  07/30/2006   REPLACEMENT TOTAL KNEE Left    TOTAL ABDOMINAL HYSTERECTOMY W/ BILATERAL SALPINGOOPHORECTOMY     uterine cancer     Allergies  Allergen Reactions   Ivp Dye [Iodinated Contrast Media]       Family History  Problem Relation Age of Onset   Heart attack Brother 49   Heart attack Father 42   CAD Brother    CAD Father    Heart disease Sister        x's 2     Social History Ms. Jutras reports that she quit smoking about 54 years ago. Her smoking use included cigarettes. She has never used smokeless tobacco. Ms. Ocejo reports that she does not currently use alcohol.   Review of Systems CONSTITUTIONAL: No weight loss, fever, chills, weakness or fatigue.  HEENT: Eyes:  No visual loss, blurred vision, double vision or yellow sclerae.No hearing loss, sneezing, congestion, runny nose or sore throat.  SKIN: No rash or itching.  CARDIOVASCULAR: per hpi RESPIRATORY: per hpi GASTROINTESTINAL: No anorexia, nausea, vomiting or diarrhea. No abdominal pain or blood.  GENITOURINARY: No burning on urination, no polyuria NEUROLOGICAL: No headache, dizziness, syncope, paralysis, ataxia, numbness or tingling in the extremities. No change in bowel or bladder control.  MUSCULOSKELETAL: No muscle, back pain, joint pain or stiffness.  LYMPHATICS: No enlarged nodes. No history of splenectomy.  PSYCHIATRIC: No history of depression or anxiety.  ENDOCRINOLOGIC: No reports of sweating, cold or heat intolerance. No polyuria or polydipsia.  Marland Kitchen   Physical Examination Today's Vitals   06/11/22 1342  BP: 138/80  Pulse: 74  SpO2: 92%  Weight: 274 lb 3.2 oz (124.4 kg)  Height: '4\' 8"'$  (1.422 m)   Body mass index is 61.47 kg/m.  Gen: resting comfortably, no acute distress HEENT: no scleral icterus, pupils equal round and reactive, no palptable cervical adenopathy,  CV: RRR, no m/r/g no jvd Resp: Clear to auscultation bilaterally GI: abdomen is soft, non-tender, non-distended, normal bowel sounds, no hepatosplenomegaly MSK: extremities are warm,1+ bilateral LE edema.  Skin: warm, no rash Neuro:  no focal deficits Psych: appropriate affect   Diagnostic Studies  09/2021 echo 1. Left ventricular ejection fraction, by estimation, is 70 to 75%. The  left ventricle has hyperdynamic function. Left ventricular endocardial  border not optimally defined to evaluate regional wall motion. Left  ventricular diastolic parameters are  indeterminate. Elevated left atrial pressure. The average left ventricular  global longitudinal strain is -21.0 %. The global longitudinal strain is  normal.   2. Right ventricular systolic function is normal. The right ventricular  size is normal.   3.  The mitral valve is normal in structure. No evidence of mitral valve  regurgitation. No evidence of mitral stenosis.   4. The aortic valve was not well visualized. There is mild calcification  of the aortic valve. There is mild thickening of the aortic valve. Aortic  valve regurgitation is not visualized. No aortic stenosis is present.    Assessment and Plan  1. Chest pain/ - multiple CAD risk factors including family history of early CAD/MI - echo was benign - progressing chest pain some exrtional component - BMI around 60, poor noninvasive candidate. She also reports severe adverse reaction to pharmacoogical stress testing I'm assuming it was regadenson the way she described - best option would be cath, she will think over and  let us know - dye allergy, would need prep before  2. LE edema - increase lasix to '40mg'$  daily.  - echo was benign      Arnoldo Lenis, M.D.

## 2022-06-11 NOTE — Patient Instructions (Addendum)
Medication Instructions:  Your physician has recommended you make the following change in your medication:  Increase furosemide to 40 mg daily Continue other medications the same  Labwork: none  Testing/Procedures: Call office if you decide to do the heart catheterization  Follow-Up: Your physician recommends that you schedule a follow-up appointment in: 3 months  Any Other Special Instructions Will Be Listed Below (If Applicable).  If you need a refill on your cardiac medications before your next appointment, please call your pharmacy.

## 2022-07-08 ENCOUNTER — Telehealth: Payer: Self-pay | Admitting: Cardiology

## 2022-07-08 NOTE — Telephone Encounter (Signed)
Spoke with patient since no DPR on file to speak with husband Shay Kotarski), who says she has flu like symptoms right now and will call back when husband is home to discuss whether or not she wants to set up heart cath. Says she does not want to set it up at this time.

## 2022-07-08 NOTE — Telephone Encounter (Signed)
Pt's husband came into the office wanting to set up pt's heart cath  Has to be on a Mon, Tues, or Wed   Please call [phone]- Ardelle Balls

## 2022-07-08 NOTE — Telephone Encounter (Signed)
Please arrange left heart cath for chest pain. Note she has dye allergy and will require premedication protocol for allergy.    Dominga Ferry MD

## 2022-07-09 NOTE — Telephone Encounter (Signed)
Scheduled to see Jenna Sherman on 07/21/2022 @1 :30 pm to update H&P for heart cath. Advised husband to come with patient to visit and also need DPR completed.

## 2022-07-09 NOTE — Telephone Encounter (Signed)
Contacted and offered nurse visit for today at 12:30 pm to go over cath instructions. Unable to confirm at this time but will call back to confirm if nurse visit for today works.

## 2022-07-16 DIAGNOSIS — R7301 Impaired fasting glucose: Secondary | ICD-10-CM | POA: Diagnosis not present

## 2022-07-16 DIAGNOSIS — E7849 Other hyperlipidemia: Secondary | ICD-10-CM | POA: Diagnosis not present

## 2022-07-16 DIAGNOSIS — I10 Essential (primary) hypertension: Secondary | ICD-10-CM | POA: Diagnosis not present

## 2022-07-21 ENCOUNTER — Ambulatory Visit: Payer: Medicare Other | Attending: Nurse Practitioner | Admitting: Nurse Practitioner

## 2022-07-21 ENCOUNTER — Encounter: Payer: Self-pay | Admitting: Nurse Practitioner

## 2022-07-21 VITALS — BP 110/66 | HR 62 | Wt 274.6 lb

## 2022-07-21 DIAGNOSIS — R531 Weakness: Secondary | ICD-10-CM | POA: Diagnosis not present

## 2022-07-21 DIAGNOSIS — I10 Essential (primary) hypertension: Secondary | ICD-10-CM

## 2022-07-21 DIAGNOSIS — I872 Venous insufficiency (chronic) (peripheral): Secondary | ICD-10-CM | POA: Diagnosis not present

## 2022-07-21 DIAGNOSIS — R600 Localized edema: Secondary | ICD-10-CM

## 2022-07-21 DIAGNOSIS — R32 Unspecified urinary incontinence: Secondary | ICD-10-CM

## 2022-07-21 DIAGNOSIS — R079 Chest pain, unspecified: Secondary | ICD-10-CM

## 2022-07-21 DIAGNOSIS — R0602 Shortness of breath: Secondary | ICD-10-CM

## 2022-07-21 MED ORDER — NITROGLYCERIN 0.4 MG SL SUBL: 0.4000 mg | SUBLINGUAL_TABLET | SUBLINGUAL | 2 refills | Status: AC | PRN

## 2022-07-21 NOTE — Progress Notes (Signed)
Office Visit    Patient Name: [name] Date of Encounter: 07/21/2022  PCP:  Estanislado Pandy, MD   [facility]  Cardiologist:  Dina Rich, MD  Advanced Practice Provider:  No care team member to display Electrophysiologist:  None   Chief Complaint    Jenna Sherman is a 72 y.o. female with a hx of hyperlipidemia, HTN, family hx of CVD, hx of chest pain, COPD, morbid obesity, and hx of vertigo, peripheral neuropathy, and hx of uterine cancer, who presents today for scheduled follow-up.    Past Medical History    Past Medical History:  Diagnosis Date   Asthma    Chronic back pain    Depression    History of uterine cancer    History of vertigo    Hypertension    Normal LVF (EF 65%) by 2D Echo, August 2005. Mild left ventricular hypertrophy; mild mitral regurgitation   Hypertriglyceridemia    Morbid obesity (HCC)    Peripheral neuropathy    Past Surgical History:  Procedure Laterality Date   CARPAL TUNNEL RELEASE  07/30/2006   REPLACEMENT TOTAL KNEE Left    TOTAL ABDOMINAL HYSTERECTOMY W/ BILATERAL SALPINGOOPHORECTOMY     uterine cancer    Allergies  Allergies  Allergen Reactions   Ivp Dye [Iodinated Contrast Media]     History of Present Illness    Jenna Sherman is a 72 y.o. female with a PMH as mentioned above.   Last seen by Dr. Dina Rich on 06/11/2022.  Reported chest pain along mid chest at that office visit, stated lasted about 1-1/2 minutes, spread to her shoulders.  Associated with rest or activity, worse with walking and admitted to shortness of breath with her symptoms.  Her chest pain had progressed since last office visit.   Called back earlier this month wanting to proceed with left heart cath. Today she presents for office visit. She states...  She presents today with her husband.  Doing about the same since last office visit 1 month ago.  Chest pain is stable, worse during periods of exertion/anxiety.  Husband  states has panic attacks and anxiety quite often.  Feet and legs continue to remain swollen, however patient says this does appear better since last office visit when Lasix was increased.  Husband states she did have a better day yesterday.  Patient is requesting nitroglycerin refill.  Denies any active chest pain recently.  Does admit to significant DOE on exertion.  Husband states at 1 point there was a concern about possible diagnosis of COPD.  Unable to lay down flat at the current time.  Does admit to difficulty with ADLs, incontinent.  Needs help with setting up pure wick.  Plan: She is not stable at this time to undergo left heart catheterization.  I stated we need to get her breathing under control and improved in order to proceed with surgery.  Therefore, will refer to pulmonology for what I believe is a possible diagnosis of COPD.  I believe she needs to qualify for oxygen.  Will obtain the following labs to be drawn with PCP at upcoming visit this week: CBC, VMAT, thyroid panel, proBNP.  Will write prescription for compression socks.  Will refill nitroglycerin per her request.  I did recommend based on her symptoms to be evaluated in ED, however she declined at this time.  Stated symptoms progress or continue to decline, need to be evaluated in the ED.   I  will send community message for referral for home health.  She would greatly benefit from this.  EKG negative for anything acute today.  Follow-up with me in 6 to 8 weeks.    EKGs/Labs/Other Studies Reviewed:   The following studies were reviewed today: ***  EKG:  EKG is *** ordered today.  The ekg ordered today demonstrates ***  Recent Labs: 09/17/2021: BUN 17; Creatinine, Ser 0.52; Hemoglobin 13.5; Platelets 92; Potassium 4.3; Sodium 138  Recent Lipid Panel No results found for: "CHOL", "TRIG", "HDL", "CHOLHDL", "VLDL", "LDLCALC", "LDLDIRECT"  Risk Assessment/Calculations:  {Does this patient have ATRIAL  FIBRILLATION?[phone]}  Home Medications   No outpatient medications have been marked as taking for the 07/21/22 encounter (Appointment) with Sharlene Dory, NP.     Review of Systems   ***   All other systems reviewed and are otherwise negative except as noted above.  Physical Exam    VS:  There were no vitals taken for this visit. , BMI There is no height or weight on file to calculate BMI.  Wt Readings from Last 3 Encounters:  06/11/22 274 lb 3.2 oz (124.4 kg)  10/21/21 264 lb 12.8 oz (120.1 kg)  09/17/21 257 lb (116.6 kg)     GEN: Well nourished, well developed, in no acute distress. HEENT: normal. Neck: Supple, no JVD, carotid bruits, or masses. Cardiac: ***RRR, no murmurs, rubs, or gallops. No clubbing, cyanosis, edema.  ***Radials/PT 2+ and equal bilaterally.  Respiratory:  ***Respirations regular and unlabored, clear to auscultation bilaterally. GI: Soft, nontender, nondistended. MS: No deformity or atrophy. Skin: Warm and dry, no rash. Neuro:  Strength and sensation are intact. Psych: Normal affect.  Assessment & Plan    ***  {Are you ordering a CV Procedure (e.g. stress test, cath, DCCV, TEE, etc)?   Press F2        :[id number]}      Disposition: Follow up {follow up:15908} with Dina Rich, MD or APP.  Signed, Sharlene Dory, NP 07/21/2022, 10:39 AM [facility]

## 2022-07-21 NOTE — Patient Instructions (Signed)
Medication Instructions:  Your physician recommends that you continue on your current medications as directed. Please refer to the Current Medication list given to you today.   Labwork: CBC, BMET, BNP, Thyroid Panel - due in 2-3 days @ [facility]  Testing/Procedures: none  Follow-Up:  Your physician recommends that you schedule a follow-up appointment in: 6-8 weeks  Any Other Special Instructions Will Be Listed Below (If Applicable).  You have been referred to Pulmonology  If you need a refill on your cardiac medications before your next appointment, please call your pharmacy.

## 2022-07-23 DIAGNOSIS — J45909 Unspecified asthma, uncomplicated: Secondary | ICD-10-CM | POA: Diagnosis not present

## 2022-07-23 DIAGNOSIS — R42 Dizziness and giddiness: Secondary | ICD-10-CM | POA: Diagnosis not present

## 2022-07-23 DIAGNOSIS — G629 Polyneuropathy, unspecified: Secondary | ICD-10-CM | POA: Diagnosis not present

## 2022-07-23 DIAGNOSIS — R030 Elevated blood-pressure reading, without diagnosis of hypertension: Secondary | ICD-10-CM | POA: Diagnosis not present

## 2022-07-23 DIAGNOSIS — H9190 Unspecified hearing loss, unspecified ear: Secondary | ICD-10-CM | POA: Diagnosis not present

## 2022-07-23 DIAGNOSIS — E7849 Other hyperlipidemia: Secondary | ICD-10-CM | POA: Diagnosis not present

## 2022-07-23 DIAGNOSIS — R609 Edema, unspecified: Secondary | ICD-10-CM | POA: Diagnosis not present

## 2022-07-23 DIAGNOSIS — R079 Chest pain, unspecified: Secondary | ICD-10-CM | POA: Diagnosis not present

## 2022-07-23 DIAGNOSIS — M170 Bilateral primary osteoarthritis of knee: Secondary | ICD-10-CM | POA: Diagnosis not present

## 2022-07-23 DIAGNOSIS — R7301 Impaired fasting glucose: Secondary | ICD-10-CM | POA: Diagnosis not present

## 2022-07-24 NOTE — Addendum Note (Signed)
Addended by: Sharlene Dory on: 07/24/2022 03:24 PM   Modules accepted: Orders

## 2022-07-29 DIAGNOSIS — J45909 Unspecified asthma, uncomplicated: Secondary | ICD-10-CM | POA: Diagnosis not present

## 2022-07-29 DIAGNOSIS — I110 Hypertensive heart disease with heart failure: Secondary | ICD-10-CM | POA: Diagnosis not present

## 2022-07-29 DIAGNOSIS — Z7951 Long term (current) use of inhaled steroids: Secondary | ICD-10-CM | POA: Diagnosis not present

## 2022-07-29 DIAGNOSIS — S41111D Laceration without foreign body of right upper arm, subsequent encounter: Secondary | ICD-10-CM | POA: Diagnosis not present

## 2022-07-29 DIAGNOSIS — R531 Weakness: Secondary | ICD-10-CM | POA: Diagnosis not present

## 2022-07-29 DIAGNOSIS — G8929 Other chronic pain: Secondary | ICD-10-CM | POA: Diagnosis not present

## 2022-07-29 DIAGNOSIS — I509 Heart failure, unspecified: Secondary | ICD-10-CM | POA: Diagnosis not present

## 2022-07-29 DIAGNOSIS — G629 Polyneuropathy, unspecified: Secondary | ICD-10-CM | POA: Diagnosis not present

## 2022-07-29 DIAGNOSIS — F32A Depression, unspecified: Secondary | ICD-10-CM | POA: Diagnosis not present

## 2022-07-29 DIAGNOSIS — E785 Hyperlipidemia, unspecified: Secondary | ICD-10-CM | POA: Diagnosis not present

## 2022-07-29 DIAGNOSIS — Z791 Long term (current) use of non-steroidal anti-inflammatories (NSAID): Secondary | ICD-10-CM | POA: Diagnosis not present

## 2022-07-29 DIAGNOSIS — Z604 Social exclusion and rejection: Secondary | ICD-10-CM | POA: Diagnosis not present

## 2022-07-29 DIAGNOSIS — M549 Dorsalgia, unspecified: Secondary | ICD-10-CM | POA: Diagnosis not present

## 2022-07-29 DIAGNOSIS — I872 Venous insufficiency (chronic) (peripheral): Secondary | ICD-10-CM | POA: Diagnosis not present

## 2022-07-29 DIAGNOSIS — Z9181 History of falling: Secondary | ICD-10-CM | POA: Diagnosis not present

## 2022-08-04 DIAGNOSIS — I872 Venous insufficiency (chronic) (peripheral): Secondary | ICD-10-CM | POA: Diagnosis not present

## 2022-08-04 DIAGNOSIS — E785 Hyperlipidemia, unspecified: Secondary | ICD-10-CM | POA: Diagnosis not present

## 2022-08-04 DIAGNOSIS — R531 Weakness: Secondary | ICD-10-CM | POA: Diagnosis not present

## 2022-08-04 DIAGNOSIS — F32A Depression, unspecified: Secondary | ICD-10-CM | POA: Diagnosis not present

## 2022-08-04 DIAGNOSIS — Z7951 Long term (current) use of inhaled steroids: Secondary | ICD-10-CM | POA: Diagnosis not present

## 2022-08-04 DIAGNOSIS — Z9181 History of falling: Secondary | ICD-10-CM | POA: Diagnosis not present

## 2022-08-04 DIAGNOSIS — G8929 Other chronic pain: Secondary | ICD-10-CM | POA: Diagnosis not present

## 2022-08-04 DIAGNOSIS — M549 Dorsalgia, unspecified: Secondary | ICD-10-CM | POA: Diagnosis not present

## 2022-08-04 DIAGNOSIS — S41111D Laceration without foreign body of right upper arm, subsequent encounter: Secondary | ICD-10-CM | POA: Diagnosis not present

## 2022-08-04 DIAGNOSIS — I509 Heart failure, unspecified: Secondary | ICD-10-CM | POA: Diagnosis not present

## 2022-08-04 DIAGNOSIS — J45909 Unspecified asthma, uncomplicated: Secondary | ICD-10-CM | POA: Diagnosis not present

## 2022-08-04 DIAGNOSIS — I110 Hypertensive heart disease with heart failure: Secondary | ICD-10-CM | POA: Diagnosis not present

## 2022-08-04 DIAGNOSIS — Z791 Long term (current) use of non-steroidal anti-inflammatories (NSAID): Secondary | ICD-10-CM | POA: Diagnosis not present

## 2022-08-04 DIAGNOSIS — Z604 Social exclusion and rejection: Secondary | ICD-10-CM | POA: Diagnosis not present

## 2022-08-04 DIAGNOSIS — G629 Polyneuropathy, unspecified: Secondary | ICD-10-CM | POA: Diagnosis not present

## 2022-08-07 DIAGNOSIS — E785 Hyperlipidemia, unspecified: Secondary | ICD-10-CM | POA: Diagnosis not present

## 2022-08-07 DIAGNOSIS — Z9181 History of falling: Secondary | ICD-10-CM | POA: Diagnosis not present

## 2022-08-07 DIAGNOSIS — F32A Depression, unspecified: Secondary | ICD-10-CM | POA: Diagnosis not present

## 2022-08-07 DIAGNOSIS — Z791 Long term (current) use of non-steroidal anti-inflammatories (NSAID): Secondary | ICD-10-CM | POA: Diagnosis not present

## 2022-08-07 DIAGNOSIS — J45909 Unspecified asthma, uncomplicated: Secondary | ICD-10-CM | POA: Diagnosis not present

## 2022-08-07 DIAGNOSIS — Z604 Social exclusion and rejection: Secondary | ICD-10-CM | POA: Diagnosis not present

## 2022-08-07 DIAGNOSIS — S41111D Laceration without foreign body of right upper arm, subsequent encounter: Secondary | ICD-10-CM | POA: Diagnosis not present

## 2022-08-07 DIAGNOSIS — I110 Hypertensive heart disease with heart failure: Secondary | ICD-10-CM | POA: Diagnosis not present

## 2022-08-07 DIAGNOSIS — M549 Dorsalgia, unspecified: Secondary | ICD-10-CM | POA: Diagnosis not present

## 2022-08-07 DIAGNOSIS — R531 Weakness: Secondary | ICD-10-CM | POA: Diagnosis not present

## 2022-08-07 DIAGNOSIS — I509 Heart failure, unspecified: Secondary | ICD-10-CM | POA: Diagnosis not present

## 2022-08-07 DIAGNOSIS — G629 Polyneuropathy, unspecified: Secondary | ICD-10-CM | POA: Diagnosis not present

## 2022-08-07 DIAGNOSIS — Z7951 Long term (current) use of inhaled steroids: Secondary | ICD-10-CM | POA: Diagnosis not present

## 2022-08-07 DIAGNOSIS — I872 Venous insufficiency (chronic) (peripheral): Secondary | ICD-10-CM | POA: Diagnosis not present

## 2022-08-07 DIAGNOSIS — G8929 Other chronic pain: Secondary | ICD-10-CM | POA: Diagnosis not present

## 2022-08-09 DIAGNOSIS — F32A Depression, unspecified: Secondary | ICD-10-CM | POA: Diagnosis not present

## 2022-08-09 DIAGNOSIS — M549 Dorsalgia, unspecified: Secondary | ICD-10-CM | POA: Diagnosis not present

## 2022-08-09 DIAGNOSIS — Z791 Long term (current) use of non-steroidal anti-inflammatories (NSAID): Secondary | ICD-10-CM | POA: Diagnosis not present

## 2022-08-09 DIAGNOSIS — G629 Polyneuropathy, unspecified: Secondary | ICD-10-CM | POA: Diagnosis not present

## 2022-08-09 DIAGNOSIS — S41111D Laceration without foreign body of right upper arm, subsequent encounter: Secondary | ICD-10-CM | POA: Diagnosis not present

## 2022-08-09 DIAGNOSIS — R531 Weakness: Secondary | ICD-10-CM | POA: Diagnosis not present

## 2022-08-09 DIAGNOSIS — Z9181 History of falling: Secondary | ICD-10-CM | POA: Diagnosis not present

## 2022-08-09 DIAGNOSIS — G8929 Other chronic pain: Secondary | ICD-10-CM | POA: Diagnosis not present

## 2022-08-09 DIAGNOSIS — Z7951 Long term (current) use of inhaled steroids: Secondary | ICD-10-CM | POA: Diagnosis not present

## 2022-08-09 DIAGNOSIS — I509 Heart failure, unspecified: Secondary | ICD-10-CM | POA: Diagnosis not present

## 2022-08-09 DIAGNOSIS — J45909 Unspecified asthma, uncomplicated: Secondary | ICD-10-CM | POA: Diagnosis not present

## 2022-08-09 DIAGNOSIS — E785 Hyperlipidemia, unspecified: Secondary | ICD-10-CM | POA: Diagnosis not present

## 2022-08-09 DIAGNOSIS — I872 Venous insufficiency (chronic) (peripheral): Secondary | ICD-10-CM | POA: Diagnosis not present

## 2022-08-09 DIAGNOSIS — I110 Hypertensive heart disease with heart failure: Secondary | ICD-10-CM | POA: Diagnosis not present

## 2022-08-09 DIAGNOSIS — Z604 Social exclusion and rejection: Secondary | ICD-10-CM | POA: Diagnosis not present

## 2022-08-12 DIAGNOSIS — F32A Depression, unspecified: Secondary | ICD-10-CM | POA: Diagnosis not present

## 2022-08-12 DIAGNOSIS — Z9181 History of falling: Secondary | ICD-10-CM | POA: Diagnosis not present

## 2022-08-12 DIAGNOSIS — Z604 Social exclusion and rejection: Secondary | ICD-10-CM | POA: Diagnosis not present

## 2022-08-12 DIAGNOSIS — I110 Hypertensive heart disease with heart failure: Secondary | ICD-10-CM | POA: Diagnosis not present

## 2022-08-12 DIAGNOSIS — I872 Venous insufficiency (chronic) (peripheral): Secondary | ICD-10-CM | POA: Diagnosis not present

## 2022-08-12 DIAGNOSIS — G8929 Other chronic pain: Secondary | ICD-10-CM | POA: Diagnosis not present

## 2022-08-12 DIAGNOSIS — M549 Dorsalgia, unspecified: Secondary | ICD-10-CM | POA: Diagnosis not present

## 2022-08-12 DIAGNOSIS — S41111D Laceration without foreign body of right upper arm, subsequent encounter: Secondary | ICD-10-CM | POA: Diagnosis not present

## 2022-08-12 DIAGNOSIS — Z791 Long term (current) use of non-steroidal anti-inflammatories (NSAID): Secondary | ICD-10-CM | POA: Diagnosis not present

## 2022-08-12 DIAGNOSIS — E785 Hyperlipidemia, unspecified: Secondary | ICD-10-CM | POA: Diagnosis not present

## 2022-08-12 DIAGNOSIS — R531 Weakness: Secondary | ICD-10-CM | POA: Diagnosis not present

## 2022-08-12 DIAGNOSIS — G629 Polyneuropathy, unspecified: Secondary | ICD-10-CM | POA: Diagnosis not present

## 2022-08-12 DIAGNOSIS — I509 Heart failure, unspecified: Secondary | ICD-10-CM | POA: Diagnosis not present

## 2022-08-12 DIAGNOSIS — Z7951 Long term (current) use of inhaled steroids: Secondary | ICD-10-CM | POA: Diagnosis not present

## 2022-08-12 DIAGNOSIS — J45909 Unspecified asthma, uncomplicated: Secondary | ICD-10-CM | POA: Diagnosis not present

## 2022-08-19 DIAGNOSIS — E785 Hyperlipidemia, unspecified: Secondary | ICD-10-CM | POA: Diagnosis not present

## 2022-08-19 DIAGNOSIS — J45909 Unspecified asthma, uncomplicated: Secondary | ICD-10-CM | POA: Diagnosis not present

## 2022-08-19 DIAGNOSIS — I509 Heart failure, unspecified: Secondary | ICD-10-CM | POA: Diagnosis not present

## 2022-08-19 DIAGNOSIS — Z791 Long term (current) use of non-steroidal anti-inflammatories (NSAID): Secondary | ICD-10-CM | POA: Diagnosis not present

## 2022-08-19 DIAGNOSIS — Z7951 Long term (current) use of inhaled steroids: Secondary | ICD-10-CM | POA: Diagnosis not present

## 2022-08-19 DIAGNOSIS — I872 Venous insufficiency (chronic) (peripheral): Secondary | ICD-10-CM | POA: Diagnosis not present

## 2022-08-19 DIAGNOSIS — I110 Hypertensive heart disease with heart failure: Secondary | ICD-10-CM | POA: Diagnosis not present

## 2022-08-19 DIAGNOSIS — S41111D Laceration without foreign body of right upper arm, subsequent encounter: Secondary | ICD-10-CM | POA: Diagnosis not present

## 2022-08-19 DIAGNOSIS — F32A Depression, unspecified: Secondary | ICD-10-CM | POA: Diagnosis not present

## 2022-08-19 DIAGNOSIS — R531 Weakness: Secondary | ICD-10-CM | POA: Diagnosis not present

## 2022-08-19 DIAGNOSIS — M549 Dorsalgia, unspecified: Secondary | ICD-10-CM | POA: Diagnosis not present

## 2022-08-19 DIAGNOSIS — Z604 Social exclusion and rejection: Secondary | ICD-10-CM | POA: Diagnosis not present

## 2022-08-19 DIAGNOSIS — G8929 Other chronic pain: Secondary | ICD-10-CM | POA: Diagnosis not present

## 2022-08-19 DIAGNOSIS — Z9181 History of falling: Secondary | ICD-10-CM | POA: Diagnosis not present

## 2022-08-19 DIAGNOSIS — G629 Polyneuropathy, unspecified: Secondary | ICD-10-CM | POA: Diagnosis not present

## 2022-08-26 ENCOUNTER — Ambulatory Visit: Payer: Medicare Other | Admitting: Pulmonary Disease

## 2022-08-26 ENCOUNTER — Encounter: Payer: Self-pay | Admitting: Pulmonary Disease

## 2022-08-26 VITALS — BP 187/79 | HR 81 | Ht <= 58 in | Wt 275.0 lb

## 2022-08-26 DIAGNOSIS — R0609 Other forms of dyspnea: Secondary | ICD-10-CM | POA: Diagnosis not present

## 2022-08-26 DIAGNOSIS — R0602 Shortness of breath: Secondary | ICD-10-CM | POA: Diagnosis not present

## 2022-08-26 DIAGNOSIS — G4733 Obstructive sleep apnea (adult) (pediatric): Secondary | ICD-10-CM | POA: Diagnosis not present

## 2022-08-26 DIAGNOSIS — I5032 Chronic diastolic (congestive) heart failure: Secondary | ICD-10-CM | POA: Diagnosis not present

## 2022-08-26 DIAGNOSIS — E781 Pure hyperglyceridemia: Secondary | ICD-10-CM | POA: Diagnosis not present

## 2022-08-26 DIAGNOSIS — J9611 Chronic respiratory failure with hypoxia: Secondary | ICD-10-CM | POA: Diagnosis not present

## 2022-08-26 DIAGNOSIS — E7800 Pure hypercholesterolemia, unspecified: Secondary | ICD-10-CM | POA: Diagnosis not present

## 2022-08-26 DIAGNOSIS — Z79899 Other long term (current) drug therapy: Secondary | ICD-10-CM | POA: Diagnosis not present

## 2022-08-26 DIAGNOSIS — J9610 Chronic respiratory failure, unspecified whether with hypoxia or hypercapnia: Secondary | ICD-10-CM | POA: Diagnosis not present

## 2022-08-26 NOTE — Assessment & Plan Note (Signed)
Given excessive daytime somnolence, narrow pharyngeal exam, witnessed apneas & loud snoring, obstructive sleep apnea is very likely & an overnight polysomnogram will be scheduled as a home study. The pathophysiology of obstructive sleep apnea , it's cardiovascular consequences & modes of treatment including CPAP were discused with the patient in detail & they evidenced understanding. Pretest probability is high and depending on results she may need a formal titration study

## 2022-08-26 NOTE — Progress Notes (Signed)
Subjective:    Patient ID: Jenna Sherman, female    DOB: [birth year], 72 y.o.   MRN: [id number]  HPI  72 year old obese woman presents for follow-up of "COPD" and hypoxia She has been following with cardiology for HFpEF and is maintained on 40 mg of Lasix daily. She arrives in a wheelchair today.  She is extremely hard of hearing, history is obtained from chart review and speaking to husband Audie.  He reports that she has been short of breath and oxygen level noted to be dropping for the past 3 months.  Leg swelling has been ongoing for 2 years.  She arrived with a saturation of 82% and improved to 93% on oxygen.  She is not ambulatory at home much and stays within her room and goes to the bathroom. She quit smoking in 1970, only smoked as a teen.  She has been exposed to secondhand smoke, her mother died of COPD and required oxygen.  Her COPD has been attributed to secondhand smoke. Chest x-ray 09/17/2021 showed cardiomegaly with pulm vascular congestion. Echo 09/2021 showed normal LVEF Cardiology advised her to proceed with heart cath and she is agreeable to do this now  She sleeps in a chair.  She is not able to lie down in bed, if she does she props herself up with several pillows.  Loud snoring has been noted by her husband.  She reports choking episodes in her sleep   Past Medical History:  Diagnosis Date   Asthma    Chronic back pain    Depression    History of uterine cancer    History of vertigo    Hypertension    Normal LVF (EF 65%) by 2D Echo, August 2005. Mild left ventricular hypertrophy; mild mitral regurgitation   Hypertriglyceridemia    Morbid obesity (HCC)    Peripheral neuropathy     Past Surgical History:  Procedure Laterality Date   CARPAL TUNNEL RELEASE  07/30/2006   REPLACEMENT TOTAL KNEE Left    TOTAL ABDOMINAL HYSTERECTOMY W/ BILATERAL SALPINGOOPHORECTOMY     uterine cancer   Allergies  Allergen Reactions   Ivp Dye [Iodinated Contrast Media]      Social History   Socioeconomic History   Marital status: Married    Spouse name: Not on file   Number of children: Not on file   Years of education: Not on file   Highest education level: Not on file  Occupational History   Not on file  Tobacco Use   Smoking status: Former    Years: 1    Types: Cigarettes    Quit date: 03/31/1968    Years since quitting: 54.4    Passive exposure: Never   Smokeless tobacco: Never  Substance and Sexual Activity   Alcohol use: Not Currently   Drug use: Not Currently   Sexual activity: Not on file  Other Topics Concern   Not on file  Social History Narrative   Not on file   Social Determinants of Health   Financial Resource Strain: Not on file  Food Insecurity: Not on file  Transportation Needs: Not on file  Physical Activity: Not on file  Stress: Not on file  Social Connections: Not on file  Intimate Partner Violence: Not on file    Family History  Problem Relation Age of Onset   Heart attack Brother 37   Heart attack Father 70   CAD Brother    CAD Father    Heart disease Sister  x's 2     Review of Systems Shortness of breath at rest and with activity Nonproductive cough Irregular heartbeat Chest pains Headaches Anxiety and depression Leg swelling Joint stiffness    Objective:   Physical Exam  Gen. Pleasant, obese, in no distress, normal affect ENT - no pallor,icterus, no post nasal drip, class 2-3 airway Neck: No JVD, no thyromegaly, no carotid bruits Lungs: no use of accessory muscles, no dullness to percussion, decreased without rales or rhonchi  Cardiovascular: Rhythm regular, heart sounds  normal, no murmurs or gallops, 2+ peripheral edema Abdomen: soft and non-tender, no hepatosplenomegaly, BS normal. Musculoskeletal: No deformities, no cyanosis or clubbing, chronic stasis changes both legs Neuro:  alert, non focal, no tremors       Assessment & Plan:

## 2022-08-26 NOTE — Patient Instructions (Signed)
X blood work today  X Start O2 2L/m  X Schedule pFTs@ [facility]  X CT angiogram chest

## 2022-08-26 NOTE — Assessment & Plan Note (Signed)
She came in with oxygen saturation of 82% on room air.  We will provide her with 2 L oxygen to use both during sleep and on ambulation.  Her mobility is limited and she arrives in a wheelchair today. Because of apraxias unclear to me today, obvious causes chronic diastolic heart failure.  I doubt the diagnosis of COPD here since her smoking history is minimal.  We will add a D-dimer to her labs and if necessary proceed with CT angiogram chest to rule out pulmonary embolism but her history is more chronic.  Prior chest x-ray does not suggest ILD and there is no evidence of pulmonary hypertension on echo from 2023 I asked her to proceed with blood work today including BMET and BNP so that she can proceed with left and right heart cath as planned by cardiology

## 2022-08-26 NOTE — Assessment & Plan Note (Signed)
Check BNP. Continue Lasix 40 mg daily and aim for weight loss. They will get a weighing scale and start monitoring her weights

## 2022-08-27 ENCOUNTER — Ambulatory Visit (HOSPITAL_COMMUNITY): Payer: Medicare Other

## 2022-08-27 DIAGNOSIS — R531 Weakness: Secondary | ICD-10-CM | POA: Diagnosis not present

## 2022-08-27 DIAGNOSIS — I509 Heart failure, unspecified: Secondary | ICD-10-CM | POA: Diagnosis not present

## 2022-08-27 DIAGNOSIS — G8929 Other chronic pain: Secondary | ICD-10-CM | POA: Diagnosis not present

## 2022-08-27 DIAGNOSIS — E785 Hyperlipidemia, unspecified: Secondary | ICD-10-CM | POA: Diagnosis not present

## 2022-08-27 DIAGNOSIS — S41111D Laceration without foreign body of right upper arm, subsequent encounter: Secondary | ICD-10-CM | POA: Diagnosis not present

## 2022-08-27 DIAGNOSIS — Z604 Social exclusion and rejection: Secondary | ICD-10-CM | POA: Diagnosis not present

## 2022-08-27 DIAGNOSIS — Z791 Long term (current) use of non-steroidal anti-inflammatories (NSAID): Secondary | ICD-10-CM | POA: Diagnosis not present

## 2022-08-27 DIAGNOSIS — F32A Depression, unspecified: Secondary | ICD-10-CM | POA: Diagnosis not present

## 2022-08-27 DIAGNOSIS — Z7951 Long term (current) use of inhaled steroids: Secondary | ICD-10-CM | POA: Diagnosis not present

## 2022-08-27 DIAGNOSIS — M549 Dorsalgia, unspecified: Secondary | ICD-10-CM | POA: Diagnosis not present

## 2022-08-27 DIAGNOSIS — I872 Venous insufficiency (chronic) (peripheral): Secondary | ICD-10-CM | POA: Diagnosis not present

## 2022-08-27 DIAGNOSIS — G629 Polyneuropathy, unspecified: Secondary | ICD-10-CM | POA: Diagnosis not present

## 2022-08-27 DIAGNOSIS — Z9181 History of falling: Secondary | ICD-10-CM | POA: Diagnosis not present

## 2022-08-27 DIAGNOSIS — J45909 Unspecified asthma, uncomplicated: Secondary | ICD-10-CM | POA: Diagnosis not present

## 2022-08-27 DIAGNOSIS — I110 Hypertensive heart disease with heart failure: Secondary | ICD-10-CM | POA: Diagnosis not present

## 2022-08-27 LAB — THYROID PANEL
Free Thyroxine Index: 1.5 (ref 1.2–4.9)
T3 Uptake Ratio: 26 % (ref 24–39)

## 2022-08-27 LAB — CBC
Hematocrit: 39.3 % (ref 34.0–46.6)
MCH: 32.2 pg (ref 26.6–33.0)
MCHC: 33.6 g/dL (ref 31.5–35.7)
WBC: 4.4 10*3/uL (ref 3.4–10.8)

## 2022-08-27 LAB — BASIC METABOLIC PANEL
Calcium: 9 mg/dL (ref 8.7–10.3)
Glucose: 108 mg/dL — ABNORMAL HIGH (ref 70–99)
Sodium: 140 mmol/L (ref 134–144)

## 2022-08-27 LAB — D-DIMER, QUANTITATIVE: D-DIMER: 0.71 mg/L FEU — ABNORMAL HIGH (ref 0.00–0.49)

## 2022-08-28 LAB — BASIC METABOLIC PANEL
BUN/Creatinine Ratio: 28 (ref 12–28)
BUN: 20 mg/dL (ref 8–27)
CO2: 25 mmol/L (ref 20–29)
Chloride: 100 mmol/L (ref 96–106)
Creatinine, Ser: 0.72 mg/dL (ref 0.57–1.00)
Potassium: 4.1 mmol/L (ref 3.5–5.2)
eGFR: 90 mL/min/{1.73_m2} (ref >59)

## 2022-08-28 LAB — CBC
Hemoglobin: 13.2 g/dL (ref 11.1–15.9)
MCV: 96 fL (ref 79–97)
Platelets: 108 10*3/uL — ABNORMAL LOW (ref 150–450)
RBC: 4.1 x10E6/uL (ref 3.77–5.28)
RDW: 14.5 % (ref 11.7–15.4)

## 2022-08-28 LAB — BRAIN NATRIURETIC PEPTIDE: BNP: 90.1 pg/mL (ref 0.0–100.0)

## 2022-08-28 LAB — THYROID PANEL: T4, Total: 5.6 ug/dL (ref 4.5–12.0)

## 2022-08-29 DIAGNOSIS — J45909 Unspecified asthma, uncomplicated: Secondary | ICD-10-CM | POA: Diagnosis not present

## 2022-08-29 DIAGNOSIS — Z604 Social exclusion and rejection: Secondary | ICD-10-CM | POA: Diagnosis not present

## 2022-08-29 DIAGNOSIS — I872 Venous insufficiency (chronic) (peripheral): Secondary | ICD-10-CM | POA: Diagnosis not present

## 2022-08-29 DIAGNOSIS — Z7951 Long term (current) use of inhaled steroids: Secondary | ICD-10-CM | POA: Diagnosis not present

## 2022-08-29 DIAGNOSIS — E785 Hyperlipidemia, unspecified: Secondary | ICD-10-CM | POA: Diagnosis not present

## 2022-08-29 DIAGNOSIS — Z9181 History of falling: Secondary | ICD-10-CM | POA: Diagnosis not present

## 2022-08-29 DIAGNOSIS — G629 Polyneuropathy, unspecified: Secondary | ICD-10-CM | POA: Diagnosis not present

## 2022-08-29 DIAGNOSIS — M549 Dorsalgia, unspecified: Secondary | ICD-10-CM | POA: Diagnosis not present

## 2022-08-29 DIAGNOSIS — I110 Hypertensive heart disease with heart failure: Secondary | ICD-10-CM | POA: Diagnosis not present

## 2022-08-29 DIAGNOSIS — Z791 Long term (current) use of non-steroidal anti-inflammatories (NSAID): Secondary | ICD-10-CM | POA: Diagnosis not present

## 2022-08-29 DIAGNOSIS — I509 Heart failure, unspecified: Secondary | ICD-10-CM | POA: Diagnosis not present

## 2022-08-29 DIAGNOSIS — G8929 Other chronic pain: Secondary | ICD-10-CM | POA: Diagnosis not present

## 2022-08-29 DIAGNOSIS — F32A Depression, unspecified: Secondary | ICD-10-CM | POA: Diagnosis not present

## 2022-08-29 DIAGNOSIS — R531 Weakness: Secondary | ICD-10-CM | POA: Diagnosis not present

## 2022-08-29 DIAGNOSIS — S41111D Laceration without foreign body of right upper arm, subsequent encounter: Secondary | ICD-10-CM | POA: Diagnosis not present

## 2022-09-01 ENCOUNTER — Ambulatory Visit: Payer: Medicare Other | Admitting: Nurse Practitioner

## 2022-09-01 ENCOUNTER — Emergency Department (HOSPITAL_COMMUNITY): Payer: Medicare Other

## 2022-09-01 ENCOUNTER — Encounter: Payer: Self-pay | Admitting: Nurse Practitioner

## 2022-09-01 ENCOUNTER — Inpatient Hospital Stay (HOSPITAL_COMMUNITY)
Admission: EM | Admit: 2022-09-01 | Discharge: 2022-09-04 | DRG: 286 | Disposition: A | Discharge status: Home Health | Payer: Medicare Other | Attending: Internal Medicine | Admitting: Internal Medicine

## 2022-09-01 ENCOUNTER — Other Ambulatory Visit: Payer: Self-pay

## 2022-09-01 ENCOUNTER — Ambulatory Visit: Payer: Medicare Other | Attending: Nurse Practitioner | Admitting: Nurse Practitioner

## 2022-09-01 ENCOUNTER — Encounter (HOSPITAL_COMMUNITY): Payer: Self-pay | Admitting: Emergency Medicine

## 2022-09-01 VITALS — BP 122/64 | HR 59 | Ht <= 58 in | Wt 274.4 lb

## 2022-09-01 DIAGNOSIS — Z8542 Personal history of malignant neoplasm of other parts of uterus: Secondary | ICD-10-CM

## 2022-09-01 DIAGNOSIS — G8929 Other chronic pain: Secondary | ICD-10-CM | POA: Diagnosis not present

## 2022-09-01 DIAGNOSIS — N3001 Acute cystitis with hematuria: Secondary | ICD-10-CM | POA: Diagnosis not present

## 2022-09-01 DIAGNOSIS — H9190 Unspecified hearing loss, unspecified ear: Secondary | ICD-10-CM | POA: Diagnosis present

## 2022-09-01 DIAGNOSIS — M793 Panniculitis, unspecified: Secondary | ICD-10-CM | POA: Diagnosis not present

## 2022-09-01 DIAGNOSIS — N3000 Acute cystitis without hematuria: Secondary | ICD-10-CM | POA: Diagnosis not present

## 2022-09-01 DIAGNOSIS — I25110 Atherosclerotic heart disease of native coronary artery with unstable angina pectoris: Secondary | ICD-10-CM | POA: Diagnosis present

## 2022-09-01 DIAGNOSIS — J9611 Chronic respiratory failure with hypoxia: Secondary | ICD-10-CM | POA: Diagnosis not present

## 2022-09-01 DIAGNOSIS — Z96652 Presence of left artificial knee joint: Secondary | ICD-10-CM | POA: Diagnosis present

## 2022-09-01 DIAGNOSIS — R079 Chest pain, unspecified: Secondary | ICD-10-CM

## 2022-09-01 DIAGNOSIS — F32A Depression, unspecified: Secondary | ICD-10-CM | POA: Diagnosis present

## 2022-09-01 DIAGNOSIS — Z6841 Body Mass Index (BMI) 40.0 and over, adult: Secondary | ICD-10-CM

## 2022-09-01 DIAGNOSIS — Z825 Family history of asthma and other chronic lower respiratory diseases: Secondary | ICD-10-CM | POA: Diagnosis not present

## 2022-09-01 DIAGNOSIS — J449 Chronic obstructive pulmonary disease, unspecified: Secondary | ICD-10-CM | POA: Diagnosis not present

## 2022-09-01 DIAGNOSIS — N390 Urinary tract infection, site not specified: Secondary | ICD-10-CM | POA: Diagnosis present

## 2022-09-01 DIAGNOSIS — Z91041 Radiographic dye allergy status: Secondary | ICD-10-CM

## 2022-09-01 DIAGNOSIS — I10 Essential (primary) hypertension: Secondary | ICD-10-CM | POA: Diagnosis present

## 2022-09-01 DIAGNOSIS — R600 Localized edema: Secondary | ICD-10-CM

## 2022-09-01 DIAGNOSIS — Z79899 Other long term (current) drug therapy: Secondary | ICD-10-CM

## 2022-09-01 DIAGNOSIS — B9620 Unspecified Escherichia coli [E. coli] as the cause of diseases classified elsewhere: Secondary | ICD-10-CM | POA: Diagnosis present

## 2022-09-01 DIAGNOSIS — Z87891 Personal history of nicotine dependence: Secondary | ICD-10-CM

## 2022-09-01 DIAGNOSIS — F410 Panic disorder [episodic paroxysmal anxiety] without agoraphobia: Secondary | ICD-10-CM | POA: Diagnosis present

## 2022-09-01 DIAGNOSIS — Z9981 Dependence on supplemental oxygen: Secondary | ICD-10-CM

## 2022-09-01 DIAGNOSIS — B9689 Other specified bacterial agents as the cause of diseases classified elsewhere: Secondary | ICD-10-CM | POA: Diagnosis not present

## 2022-09-01 DIAGNOSIS — G4733 Obstructive sleep apnea (adult) (pediatric): Secondary | ICD-10-CM | POA: Diagnosis present

## 2022-09-01 DIAGNOSIS — I872 Venous insufficiency (chronic) (peripheral): Secondary | ICD-10-CM | POA: Diagnosis not present

## 2022-09-01 DIAGNOSIS — R531 Weakness: Secondary | ICD-10-CM

## 2022-09-01 DIAGNOSIS — I200 Unstable angina: Secondary | ICD-10-CM | POA: Diagnosis not present

## 2022-09-01 DIAGNOSIS — I5031 Acute diastolic (congestive) heart failure: Secondary | ICD-10-CM | POA: Diagnosis not present

## 2022-09-01 DIAGNOSIS — R0602 Shortness of breath: Secondary | ICD-10-CM | POA: Diagnosis not present

## 2022-09-01 DIAGNOSIS — Z8249 Family history of ischemic heart disease and other diseases of the circulatory system: Secondary | ICD-10-CM | POA: Diagnosis not present

## 2022-09-01 DIAGNOSIS — I110 Hypertensive heart disease with heart failure: Principal | ICD-10-CM | POA: Diagnosis present

## 2022-09-01 DIAGNOSIS — Z7982 Long term (current) use of aspirin: Secondary | ICD-10-CM

## 2022-09-01 DIAGNOSIS — R0600 Dyspnea, unspecified: Secondary | ICD-10-CM

## 2022-09-01 DIAGNOSIS — I5033 Acute on chronic diastolic (congestive) heart failure: Secondary | ICD-10-CM | POA: Diagnosis present

## 2022-09-01 DIAGNOSIS — R32 Unspecified urinary incontinence: Secondary | ICD-10-CM

## 2022-09-01 LAB — COMPREHENSIVE METABOLIC PANEL
ALT: 29 U/L (ref 0–44)
AST: 29 U/L (ref 15–41)
Albumin: 3.2 g/dL — ABNORMAL LOW (ref 3.5–5.0)
Alkaline Phosphatase: 62 U/L (ref 38–126)
Anion gap: 9 (ref 5–15)
BUN: 24 mg/dL — ABNORMAL HIGH (ref 8–23)
CO2: 32 mmol/L (ref 22–32)
Calcium: 8.9 mg/dL (ref 8.9–10.3)
Chloride: 96 mmol/L — ABNORMAL LOW (ref 98–111)
Creatinine, Ser: 0.78 mg/dL (ref 0.44–1.00)
GFR, Estimated: >60 mL/min (ref >60)
Glucose, Bld: 175 mg/dL — ABNORMAL HIGH (ref 70–99)
Potassium: 4.4 mmol/L (ref 3.5–5.1)
Sodium: 137 mmol/L (ref 135–145)
Total Bilirubin: 1.2 mg/dL (ref 0.3–1.2)
Total Protein: 7.7 g/dL (ref 6.5–8.1)

## 2022-09-01 LAB — URINALYSIS, ROUTINE W REFLEX MICROSCOPIC
Bilirubin Urine: NEGATIVE
Glucose, UA: NEGATIVE mg/dL
Ketones, ur: NEGATIVE mg/dL
Nitrite: NEGATIVE
Protein, ur: 100 mg/dL — AB
RBC / HPF: >50 RBC/hpf (ref 0–5)
Specific Gravity, Urine: 1.018 (ref 1.005–1.030)
WBC, UA: >50 WBC/hpf (ref 0–5)
pH: 8 (ref 5.0–8.0)

## 2022-09-01 LAB — CBC
HCT: 38.9 % (ref 36.0–46.0)
Hemoglobin: 12.9 g/dL (ref 12.0–15.0)
MCH: 32.3 pg (ref 26.0–34.0)
MCHC: 33.2 g/dL (ref 30.0–36.0)
MCV: 97.5 fL (ref 80.0–100.0)
Platelets: 121 10*3/uL — ABNORMAL LOW (ref 150–400)
RBC: 3.99 MIL/uL (ref 3.87–5.11)
RDW: 15.9 % — ABNORMAL HIGH (ref 11.5–15.5)
WBC: 5.3 10*3/uL (ref 4.0–10.5)
nRBC: 0 % (ref 0.0–0.2)

## 2022-09-01 LAB — LIPASE, BLOOD: Lipase: 68 U/L — ABNORMAL HIGH (ref 11–51)

## 2022-09-01 LAB — BRAIN NATRIURETIC PEPTIDE: B Natriuretic Peptide: 140 pg/mL — ABNORMAL HIGH (ref 0.0–100.0)

## 2022-09-01 LAB — TROPONIN I (HIGH SENSITIVITY)
Troponin I (High Sensitivity): 3 ng/L (ref <18)
Troponin I (High Sensitivity): 4 ng/L (ref <18)

## 2022-09-01 LAB — D-DIMER, QUANTITATIVE: D-Dimer, Quant: 2.55 ug/mL-FEU — ABNORMAL HIGH (ref 0.00–0.50)

## 2022-09-01 MED ORDER — FLUTICASONE FUROATE-VILANTEROL 200-25 MCG/ACT IN AEPB
1.0000 | INHALATION_SPRAY | Freq: Every day | RESPIRATORY_TRACT | Status: DC
  Administered 2022-09-03: 1 via RESPIRATORY_TRACT
  Filled 2022-09-01 (×2): qty 28

## 2022-09-01 MED ORDER — ONDANSETRON HCL 4 MG/2ML IJ SOLN: 4.0000 mg | Freq: Four times a day (QID) | INTRAMUSCULAR | Status: DC | PRN

## 2022-09-01 MED ORDER — POLYETHYLENE GLYCOL 3350 17 G PO PACK: 17.0000 g | PACK | Freq: Every day | ORAL | Status: DC | PRN

## 2022-09-01 MED ORDER — CLONAZEPAM 0.5 MG PO TABS: 0.5000 mg | ORAL_TABLET | Freq: Three times a day (TID) | ORAL | Status: DC | PRN

## 2022-09-01 MED ORDER — ENOXAPARIN SODIUM 40 MG/0.4ML IJ SOSY
40.0000 mg | PREFILLED_SYRINGE | INTRAMUSCULAR | Status: DC
  Administered 2022-09-01: 40 mg via SUBCUTANEOUS
  Filled 2022-09-01: qty 0.4

## 2022-09-01 MED ORDER — SODIUM CHLORIDE 0.9 % IV SOLN
1.0000 g | Freq: Once | INTRAVENOUS | Status: AC
  Administered 2022-09-01: 1 g via INTRAVENOUS
  Filled 2022-09-01: qty 10

## 2022-09-01 MED ORDER — METOPROLOL SUCCINATE ER 50 MG PO TB24
50.0000 mg | ORAL_TABLET | Freq: Every day | ORAL | Status: DC
  Administered 2022-09-04: 50 mg via ORAL
  Filled 2022-09-01 (×3): qty 1

## 2022-09-01 MED ORDER — ASPIRIN 325 MG PO TABS
325.0000 mg | ORAL_TABLET | Freq: Once | ORAL | Status: AC
  Administered 2022-09-01: 325 mg via ORAL
  Filled 2022-09-01: qty 1

## 2022-09-01 MED ORDER — NITROGLYCERIN 0.4 MG SL SUBL: 0.4000 mg | SUBLINGUAL_TABLET | SUBLINGUAL | Status: DC | PRN

## 2022-09-01 MED ORDER — FUROSEMIDE 10 MG/ML IJ SOLN
40.0000 mg | Freq: Two times a day (BID) | INTRAMUSCULAR | Status: DC
  Administered 2022-09-01 – 2022-09-03 (×4): 40 mg via INTRAVENOUS
  Filled 2022-09-01 (×4): qty 4

## 2022-09-01 MED ORDER — ONDANSETRON HCL 4 MG PO TABS: 4.0000 mg | ORAL_TABLET | Freq: Four times a day (QID) | ORAL | Status: DC | PRN

## 2022-09-01 MED ORDER — NYSTATIN 100000 UNIT/GM EX POWD
Freq: Two times a day (BID) | CUTANEOUS | Status: DC
  Filled 2022-09-01 (×2): qty 15

## 2022-09-01 MED ORDER — GABAPENTIN 300 MG PO CAPS
600.0000 mg | ORAL_CAPSULE | Freq: Every day | ORAL | Status: DC
  Administered 2022-09-01 – 2022-09-03 (×3): 600 mg via ORAL
  Filled 2022-09-01 (×3): qty 2

## 2022-09-01 MED ORDER — ACETAMINOPHEN 650 MG RE SUPP: 650.0000 mg | Freq: Four times a day (QID) | RECTAL | Status: DC | PRN

## 2022-09-01 MED ORDER — ACETAMINOPHEN 325 MG PO TABS: 650.0000 mg | ORAL_TABLET | Freq: Four times a day (QID) | ORAL | Status: DC | PRN

## 2022-09-01 MED ORDER — ASPIRIN 81 MG PO TBEC
81.0000 mg | DELAYED_RELEASE_TABLET | Freq: Every day | ORAL | Status: DC
  Administered 2022-09-02: 81 mg via ORAL
  Filled 2022-09-01: qty 1

## 2022-09-01 MED ORDER — SODIUM CHLORIDE 0.9 % IV SOLN
2.0000 g | INTRAVENOUS | Status: DC
  Administered 2022-09-02 – 2022-09-03 (×2): 2 g via INTRAVENOUS
  Filled 2022-09-01 (×3): qty 20

## 2022-09-01 NOTE — H&P (Signed)
History and Physical    [name]:[id number] DOB: 04-26-1950 DOA: 09/01/2022  PCP: Estanislado Pandy, MD   Patient coming from: Home  I have personally briefly reviewed patient's old medical records in [facility]  Chief Complaint: Chest Pain, Leg swelling   HPI: Jenna Sherman is a 72 y.o. female with medical history significant for chronic CHF, chronic respiratory failure on 3 L, morbid obesity, hypertension, asthma. Patient was sent to the ED from her cardiologist office with reports of chest pains, persistent lower extremity swelling, despite Lasix.  History is limited from patient as she is very hard of hearing, from unable to clarify chest pain or weight gain, or duration of symptoms, but this time she is chest pain-free after nitro given in ED.  She denies pain to her lower extremities.  She reports urinary frequency.   ED Course: Temperature 98.8.  Heart rate 58-65.  Respirate rate 19-22.  Blood pressure systolic 120s to 811B.  O2 sat 87% on room air improved to 98% on 3 L. D-dimer elevated at 2.55.  Chest x-ray with enlarged cardiopericardial silhouette close vascular congestion.  Bilateral lower extremity venous Dopplers negative for DVT. UA with moderate leukocytes.  Troponin 3. IV ceftriaxone 1 g given.  Review of Systems: As per HPI all other systems reviewed and negative.  Past Medical History:  Diagnosis Date   Asthma    Chronic back pain    Depression    History of uterine cancer    History of vertigo    Hypertension    Normal LVF (EF 65%) by 2D Echo, August 2005. Mild left ventricular hypertrophy; mild mitral regurgitation   Hypertriglyceridemia    Morbid obesity (HCC)    Peripheral neuropathy     Past Surgical History:  Procedure Laterality Date   CARPAL TUNNEL RELEASE  07/30/2006   REPLACEMENT TOTAL KNEE Left    TOTAL ABDOMINAL HYSTERECTOMY W/ BILATERAL SALPINGOOPHORECTOMY     uterine cancer     reports that she quit smoking about 54 years ago. Her  smoking use included cigarettes. She has never been exposed to tobacco smoke. She has never used smokeless tobacco. She reports that she does not currently use alcohol. She reports that she does not currently use drugs.  Allergies  Allergen Reactions   Ivp Dye [Iodinated Contrast Media]     Family History  Problem Relation Age of Onset   Heart attack Brother 66   Heart attack Father 36   CAD Brother    CAD Father    Heart disease Sister        x's 2    Prior to Admission medications   Medication Sig Start Date End Date Taking? Authorizing Provider  albuterol (PROVENTIL) (2.5 MG/3ML) 0.083% nebulizer solution Take 2.5 mg by nebulization every 4 (four) hours as needed for wheezing or shortness of breath. 10/15/17   [provider]  cetirizine (ZYRTEC) 10 MG tablet Take 1 tablet by mouth daily.    [provider]  clonazePAM (KLONOPIN) 0.5 MG tablet Take 0.5 mg by mouth every 8 (eight) hours as needed for anxiety.    [provider]  cyclobenzaprine (FLEXERIL) 10 MG tablet Take 15 mg by mouth at bedtime.    [provider]  diclofenac (VOLTAREN) 75 MG EC tablet Take 75 mg by mouth 2 (two) times daily.    [provider]  escitalopram (LEXAPRO) 20 MG tablet Take 20 mg by mouth at bedtime. 08/29/21   [provider]  fluticasone furoate-vilanterol (BREO ELLIPTA) 200-25 MCG/ACT AEPB Inhale 1 puff into the lungs daily.    [provider]  furosemide (LASIX) 40 MG tablet Take 1 tablet (40 mg total) by mouth daily. 06/11/22   Antoine Poche, MD  gabapentin (NEURONTIN) 300 MG capsule Take 600 mg by mouth at bedtime.    [provider]  meclizine (ANTIVERT) 25 MG tablet Take 25 mg by mouth 3 (three) times daily as needed for dizziness.    [provider]  metoprolol succinate (TOPROL-XL) 50 MG 24 hr tablet Take 50 mg by mouth daily. 09/17/21   [provider]  nitroGLYCERIN (NITROSTAT) 0.4 MG SL tablet Place 1  tablet (0.4 mg total) under the tongue every 5 (five) minutes x 3 doses as needed for chest pain (if no relief after 3rd dose, proceed to ED for evalution or call 911). 07/21/22   Sharlene Dory, NP  [facility] powder SMARTSIG:5 Gram(s) Topical 4 Times Daily 05/13/22   [provider]    Physical Exam: Vitals:   09/01/22 1320 09/01/22 1324 09/01/22 1811 09/01/22 1825  BP:   (!) 168/72   Pulse:   (!) 58   Resp:   20   Temp:    98.6 F (37 C)  TempSrc:    Oral  SpO2:  98% 99%   Weight: 124.3 kg     Height: 4\' 8"  (1.422 m)       Constitutional: Very hard of hearing,  calm, comfortable Vitals:   09/01/22 1320 09/01/22 1324 09/01/22 1811 09/01/22 1825  BP:   (!) 168/72   Pulse:   (!) 58   Resp:   20   Temp:    98.6 F (37 C)  TempSrc:    Oral  SpO2:  98% 99%   Weight: 124.3 kg     Height: 4\' 8"  (1.422 m)      Eyes: PERRL, lids and conjunctivae normal ENMT: Mucous membranes are moist.  Neck: normal, supple, no masses, no thyromegaly Respiratory: clear to auscultation bilaterally, no wheezing, no crackles. Normal respiratory effort. No accessory muscle use.  Cardiovascular: Regular rate and rhythm, 3/6 systolic murmur / rubs / gallops.  1+ pitting bilateral lower extremity edema to knees.  Extremities warm, Abdomen: no tenderness, no masses palpated. No hepatosplenomegaly. Bowel sounds positive.  Musculoskeletal: no clubbing / cyanosis. No joint deformity upper and lower extremities.  Skin: Erythema worse on left lower extremity, but patient reports this is baseline, no rashes, lesions, ulcers. No induration Neurologic: No's facial asymmetry, speech fluent, moving extremities spontaneously Psychiatric: Normal judgment and insight. Alert and oriented x 3. Normal mood.   Labs on Admission: I have personally reviewed following labs and imaging studies  CBC: Recent Labs  Lab 08/26/22 1357 09/01/22 1332  WBC 4.4 5.3  HGB 13.2 12.9  HCT 39.3 38.9  MCV 96 97.5  PLT 108*  121*   Basic Metabolic Panel: Recent Labs  Lab 08/26/22 1357 09/01/22 1332  NA 140 137  K 4.1 4.4  CL 100 96*  CO2 25 32  GLUCOSE 108* 175*  BUN 20 24*  CREATININE 0.72 0.78  CALCIUM 9.0 8.9   GFR: Estimated Creatinine Clearance: 73.9 mL/min (by C-G formula based on SCr of 0.78 mg/dL). Liver Function Tests: Recent Labs  Lab 09/01/22 1332  AST 29  ALT 29  ALKPHOS 62  BILITOT 1.2  PROT 7.7  ALBUMIN 3.2*   Recent Labs  Lab 09/01/22 1332  LIPASE 68*   Urine  analysis:    Component Value Date/Time   COLORURINE YELLOW 09/01/2022 1700   APPEARANCEUR CLOUDY (A) 09/01/2022 1700   LABSPEC 1.018 09/01/2022 1700   PHURINE 8.0 09/01/2022 1700   GLUCOSEU NEGATIVE 09/01/2022 1700   HGBUR MODERATE (A) 09/01/2022 1700   BILIRUBINUR NEGATIVE 09/01/2022 1700   KETONESUR NEGATIVE 09/01/2022 1700   PROTEINUR 100 (A) 09/01/2022 1700   UROBILINOGEN 1.0 04/10/2010 1637   NITRITE NEGATIVE 09/01/2022 1700   LEUKOCYTESUR LARGE (A) 09/01/2022 1700    Radiological Exams on Admission: DG Chest Portable 1 View  Result Date: 09/01/2022 CLINICAL DATA:  Shortness of breath EXAM: PORTABLE CHEST 1 VIEW COMPARISON:  X-ray 09/17/2021 and older FINDINGS: Enlarged cardiopericardial silhouette with vascular congestion. No pneumothorax, effusion or edema. Films are under penetrated. No consolidation. Overlapping cardiac leads. Calcified aorta. IMPRESSION: Enlarged cardiopericardial silhouette with vascular congestion. Electronically Signed   By: Karen Kays M.D.   On: 09/01/2022 17:50   US Venous Img Lower Bilateral  Result Date: 09/01/2022 CLINICAL DATA:  Bilateral lower extremity edema EXAM: BILATERAL LOWER EXTREMITY VENOUS DOPPLER ULTRASOUND TECHNIQUE: Gray-scale sonography with compression, as well as color and duplex ultrasound, were performed to evaluate the deep venous system(s) from the level of the common femoral vein through the popliteal and proximal calf veins. COMPARISON:  None Available.  FINDINGS: VENOUS Normal compressibility of the common femoral, superficial femoral, and popliteal veins, as well as the visualized calf veins. Visualized portions of profunda femoral vein and great saphenous vein unremarkable. No filling defects to suggest DVT on grayscale or color Doppler imaging. Doppler waveforms show normal direction of venous flow, normal respiratory plasticity and response to augmentation. OTHER Subcutaneous soft tissue edema overlying the bilateral calf. Limitations: none IMPRESSION: Negative. Electronically Signed   By: Agustin Cree M.D.   On: 09/01/2022 17:46    EKG: Independently reviewed.  Sinus rhythm, rate 55, QTc 411.  No significant change from prior.  Assessment/Plan Principal Problem:   Acute on chronic diastolic (congestive) heart failure (HCC) Active Problems:   Chest pain   UTI (urinary tract infection)   MORBID OBESITY   SLEEP APNEA, OBSTRUCTIVE   Essential hypertension   Chronic respiratory failure with hypoxia (HCC)  Assessment and Plan: * Acute on chronic diastolic (congestive) heart failure (HCC) 1+ pitting bilateral lower extremity edema, chest x-ray showing-enlarged pericardial silhouette, with vascular congestion.  Patient has 10 pound weight gain over the past year.  Troponin 3.  D-dimer elevated at 2.55.  Last echo 09/2021, EF of 70 to 75% -IV Lasix 40 twice daily -Obtain VQ scan (CT not available, also patient has contrast allergy) -Strict input output, daily weights, daily BMP -Trend Troponin -Obtain BNP -Bilateral lower extremity venous Dopplers negative for DVT  UTI (urinary tract infection) Symptomatic with urinary frequency.  UA with moderate leukocytes.  Rules out for sepsis.  Afebrile, WBC 5.3.  No recent urine cultures. Also left LE more red than right with differential warmth, but no tenderness and patients reports her LE are at baseline- monitor for now. On antibiotics for UTI anyways. -Follow-up urine cultures -Ceftriaxone 2 g daily (  Adjusted for weight )  Chest pain Chronic chest pains.  Previously left and right heart catheterization has been recommended.  EKG unchanged, troponin 3.  Dimer elevated at 2.55. -N.p.o. midnight -Trend Troponin - Cardiology Consult - F/u V Q scan in a.m  Chronic respiratory failure with hypoxia (HCC) O2 sats 87% on room air, she is chronically on 3 L, patient forgot her O2 at  home.  Currently sats greater than 97% on 3 L.  Essential hypertension Stable. -Monitor with diuresis, resume metoprolol    DVT prophylaxis: Lovenox Code Status: Full code Family Communication: None at bedside Disposition Plan: ~ 2 days Consults called: None Admission status: Inpt Tele  I certify that at the point of admission it is my clinical judgment that the patient will require inpatient hospital care spanning beyond 2 midnights from the point of admission due to high intensity of service, high risk for further deterioration and high frequency of surveillance required.   Author: Onnie Boer, MD 09/01/2022 8:52 PM  For on call review [email or web address].

## 2022-09-01 NOTE — Assessment & Plan Note (Addendum)
Chronic chest pains.  Previously left and right heart catheterization has been recommended.  EKG unchanged, troponin 3.  Dimer elevated at 2.55. -N.p.o. midnight -Trend Troponin - Cardiology Consult - F/u V Q scan in a.m

## 2022-09-01 NOTE — ED Triage Notes (Signed)
Pt sent by Dr. Philis Nettle at [facility] for eval of hematuria and "a blockage," unknown location or nature. Pt has significant redness and edema noted to bilateral lower legs and feet. Pt reports pain to head, abdomen, legs, and feet. Pt reports burning urination and bladder pain. O2 saturation 87% on room air in triage. Pt's husband notes she wears 3L supplemental oxygen at all times but that he forgot to bring her oxygen tank. Placed pt on 3L oxygen.

## 2022-09-01 NOTE — Assessment & Plan Note (Addendum)
Symptomatic with urinary frequency.  UA with moderate leukocytes.  Rules out for sepsis.  Afebrile, WBC 5.3.  No recent urine cultures. Also left LE more red than right with differential warmth, but no tenderness and patients reports her LE are at baseline- monitor for now. On antibiotics for UTI anyways. -Follow-up urine cultures -Ceftriaxone 2 g daily ( Adjusted for weight )

## 2022-09-01 NOTE — Assessment & Plan Note (Addendum)
1+ pitting bilateral lower extremity edema, chest x-ray showing-enlarged pericardial silhouette, with vascular congestion.  Patient has 10 pound weight gain over the past year.  Troponin 3.  D-dimer elevated at 2.55.  Last echo 09/2021, EF of 70 to 75% -IV Lasix 40 twice daily -Obtain VQ scan (CT not available, also patient has contrast allergy) -Strict input output, daily weights, daily BMP -Trend Troponin -Obtain BNP -Bilateral lower extremity venous Dopplers negative for DVT

## 2022-09-01 NOTE — Progress Notes (Signed)
Office Visit    Patient Name: Jenna Sherman Date of Encounter: 09/01/2022  PCP:  Estanislado Pandy, MD   [facility]  Cardiologist:  Dina Rich, MD  Advanced Practice Provider:  No care team member to display Electrophysiologist:  None   Chief Complaint    Jenna Sherman is a 72 y.o. female with a hx of hyperlipidemia, HTN, family hx of CVD, hx of chest pain, COPD, morbid obesity, and hx of vertigo, peripheral neuropathy, and hx of uterine cancer, who presents today for scheduled follow-up.    Past Medical History    Past Medical History:  Diagnosis Date   Asthma    Chronic back pain    Depression    History of uterine cancer    History of vertigo    Hypertension    Normal LVF (EF 65%) by 2D Echo, August 2005. Mild left ventricular hypertrophy; mild mitral regurgitation   Hypertriglyceridemia    Morbid obesity (HCC)    Peripheral neuropathy    Past Surgical History:  Procedure Laterality Date   CARPAL TUNNEL RELEASE  07/30/2006   REPLACEMENT TOTAL KNEE Left    TOTAL ABDOMINAL HYSTERECTOMY W/ BILATERAL SALPINGOOPHORECTOMY     uterine cancer    Allergies  Allergies  Allergen Reactions   Ivp Dye [Iodinated Contrast Media]     History of Present Illness    Jenna Sherman is a very pleasant 72 y.o. female with a PMH as mentioned above.   Last seen by Dr. Dina Rich on 06/11/2022.  Reported chest pain along mid chest at that office visit, stated lasted about 1-1/2 minutes, spread to her shoulders.  Associated with rest or activity, worse with walking and admitted to shortness of breath with her symptoms.  Her chest pain had progressed since last office visit. Called back wanting to proceed with left heart cath.   I last saw her on July 21, 2022 for office visit with her husband.  She was doing about the same since last office visit.  Chest pain was stable, worse during periods of exertion/anxiety.  Husband stated she was having panic attacks  and anxiety quite often.  Feet and legs continued to remain swollen, however patient said this appeared better since last office visit when Lasix was increased. Denied any active chest pain recently.  Admitted to significant DOE on exertion. Was unable to lay down flat at the current time, therefore she was not a good candidate for ischemic evaluation. Admitted to difficulty with ADLs, incontinent.  Was referred to pulmonology as well as home health services.  Saw Dr. Vassie Loll on Aug 26, 2022.  Was arranged sleep study for OSA evaluation.  D-dimer elevated, Dr. Vassie Loll recommended proceeding with VQ scan as she cannot tolerate CT angio of chest due to history of IV contrast allergy.  He also recommended that patient undergo left and right heart cath.  Today she presents for follow-up with her husband.  Poor historian due to [facility].  History comes from husband who states her chest pain has been getting worse.  Patient states she is having chest pain at rest and with exertion.  Still continues to have panic attacks and anxiety.  Husband states feet and legs appear to look better recently.  Has home health, but has difficulty with getting a new potty chair says that she has broken this and waiting on home health services to reorder new potty chair.   EKGs/Labs/Other Studies Reviewed:   The following  studies were reviewed today:   EKG:  EKG is not ordered today.  EKG dated July 21, 2022 revealed NSR, 60 bpm, incomplete RBBB, nonspecific ST wave changes, no acute ischemic changes.   Echo 09/2021: 1. Left ventricular ejection fraction, by estimation, is 70 to 75%. The  left ventricle has hyperdynamic function. Left ventricular endocardial  border not optimally defined to evaluate regional wall motion. Left  ventricular diastolic parameters are  indeterminate. Elevated left atrial pressure. The average left ventricular  global longitudinal strain is -21.0 %. The global longitudinal strain is normal.   2. Right  ventricular systolic function is normal. The right ventricular size is normal.   3. The mitral valve is normal in structure. No evidence of mitral valve regurgitation. No evidence of mitral stenosis.   4. The aortic valve was not well visualized. There is mild calcification of the aortic valve. There is mild thickening of the aortic valve. Aortic valve regurgitation is not visualized. No aortic stenosis is present.   Comparison(s): Echocardiogram done 06/15/13 showed an EF of 60-65%.   Recent Labs: 08/26/2022: BNP 90.1; BUN 20; Creatinine, Ser 0.72; Hemoglobin 13.2; Platelets 108; Potassium 4.1; Sodium 140  Recent Lipid Panel No results found for: "CHOL", "TRIG", "HDL", "CHOLHDL", "VLDL", "LDLCALC", "LDLDIRECT"   Home Medications   Current Meds  Medication Sig   albuterol (PROVENTIL) (2.5 MG/3ML) 0.083% nebulizer solution Take 2.5 mg by nebulization every 4 (four) hours as needed for wheezing or shortness of breath.   cetirizine (ZYRTEC) 10 MG tablet Take 1 tablet by mouth daily.   clonazePAM (KLONOPIN) 0.5 MG tablet Take 0.5 mg by mouth every 8 (eight) hours as needed for anxiety.   cyclobenzaprine (FLEXERIL) 10 MG tablet Take 15 mg by mouth at bedtime.   diclofenac (VOLTAREN) 75 MG EC tablet Take 75 mg by mouth 2 (two) times daily.   escitalopram (LEXAPRO) 20 MG tablet Take 20 mg by mouth at bedtime.   fluticasone furoate-vilanterol (BREO ELLIPTA) 200-25 MCG/ACT AEPB Inhale 1 puff into the lungs daily.   furosemide (LASIX) 40 MG tablet Take 1 tablet (40 mg total) by mouth daily.   gabapentin (NEURONTIN) 300 MG capsule Take 600 mg by mouth at bedtime.   meclizine (ANTIVERT) 25 MG tablet Take 25 mg by mouth 3 (three) times daily as needed for dizziness.   metoprolol succinate (TOPROL-XL) 50 MG 24 hr tablet Take 50 mg by mouth daily.   nitroGLYCERIN (NITROSTAT) 0.4 MG SL tablet Place 1 tablet (0.4 mg total) under the tongue every 5 (five) minutes x 3 doses as needed for chest pain (if no  relief after 3rd dose, proceed to ED for evalution or call 911).   NYAMYC powder SMARTSIG:5 Gram(s) Topical 4 Times Daily    Review of Systems    All other systems reviewed and are otherwise negative except as noted above.  Physical Exam    VS:  BP 122/64   Pulse (!) 59   Ht 4\' 8"  (1.422 m)   Wt 274 lb 6.4 oz (124.5 kg)   SpO2 91%   BMI 61.52 kg/m  , BMI Body mass index is 61.52 kg/m.  Wt Readings from Last 3 Encounters:  09/01/22 274 lb 6.4 oz (124.5 kg)  08/26/22 275 lb (124.7 kg)  07/21/22 274 lb 9.6 oz (124.6 kg)     GEN: Morbidly obese, 72 y.o. female, looks uncomfortable HEENT: normal. Neck: Supple, no JVD, carotid bruits, or masses. Cardiac: S1/S2, RRR, no murmurs, rubs, or gallops. No clubbing, cyanosis.  2-3+ pitting edema along BLE. Radials2+ /PT 1+ and equal bilaterally.  Respiratory:  Respirations regular and labored, clear to auscultation bilaterally. MS: No deformity or atrophy. Generalized weakness.  Skin: Warm and dry, no rash, continued skin discoloration along lower extremities. Neuro:  Strength and sensation are intact. Psych: Normal affect.  Assessment & Plan    Chest pain, shortness of breath (elevated D-dimer) Leg edema, chronic venous insufficiency Hypertension Morbid obesity Generalized weakness, incontinence  Patient is a 72 year old female with past medical history as mentioned above.  Presents today with her husband who acts as historian.  Does admit to recent worsening chest pain at rest and with exertion.  Shortness of breath remains stable.  Recently saw Dr. Vassie Loll who obtained a D-dimer that was elevated, recommended to proceed with VQ scan that has not been done.  PE as mentioned above, at the last office visit with the patient I recommended/strongly advised inpatient evaluation, she declined at the time.  Strongly advised/recommended inpatient evaluation again based on her worsening symptoms, she is agreeable to proceed.  Offered EMS  transportation, patient declines.  States her husband will transfer her to [facility], ED.  Called and gave report to Dr. Estell Harpin ([facility] ED physician) who verbalized understanding of report.  Upon arrival to the ED, I recommend the following be done/obtained: 1.  Twelve-lead EKG and close monitoring of vital signs 2.  Labs including the following: CBC, CMET, troponins, and D-dimer 3.  Imaging including VQ scan and two-view chest x-ray, update echocardiogram, obtain lower extremity Dopplers 4.  Consult cardiology, may need to be transferred to [facility] to undergo previously recommended right and left heart catheterization. 5.  Consult PT/OT for generalized weakness, may qualify for SNF for short-term therapy at discharge. 6.  Discharge when in stable condition.  Follow-up with outpatient cardiology 1 to 2 weeks post discharge.   Disposition: Follow up in 1-2 week(s) with Dina Rich, MD or APP.  Signed, Sharlene Dory, NP 09/01/2022, 10:18 AM [facility]

## 2022-09-01 NOTE — Patient Instructions (Signed)
Medication Instructions:   Continue all current medications.   Labwork:  none  Testing/Procedures:  none  Follow-Up:  See back 1-2 weeks post hospital   Any Other Special Instructions Will Be Listed Below (If Applicable).   If you need a refill on your cardiac medications before your next appointment, please call your pharmacy.

## 2022-09-01 NOTE — Assessment & Plan Note (Signed)
O2 sats 87% on room air, she is chronically on 3 L, patient forgot her O2 at home.  Currently sats greater than 97% on 3 L.

## 2022-09-01 NOTE — ED Notes (Signed)
ED TO INPATIENT HANDOFF REPORT  ED Nurse Name and Phone #:  Cheral Almas [phone]  S Name/Age/Gender Jenna Sherman 72 y.o. female Room/Bed: APA14/APA14  Code Status   Code Status: Not on file  Home/SNF/Other Home Patient oriented to: self, place, and time Is this baseline? Yes   Triage Complete: Triage complete  Chief Complaint Acute on chronic diastolic (congestive) heart failure (HCC) [I50.33]  Triage Note Pt sent by Dr. Philis Nettle at [facility] for eval of hematuria and "a blockage," unknown location or nature. Pt has significant redness and edema noted to bilateral lower legs and feet. Pt reports pain to head, abdomen, legs, and feet. Pt reports burning urination and bladder pain. O2 saturation 87% on room air in triage. Pt's husband notes she wears 3L supplemental oxygen at all times but that he forgot to bring her oxygen tank. Placed pt on 3L oxygen.    Allergies Allergies  Allergen Reactions   Ivp Dye [Iodinated Contrast Media]     Level of Care/Admitting Diagnosis ED Disposition     ED Disposition  Admit   Condition  --   Comment  Hospital Area: [facility] [[id number]]  Level of Care: Telemetry [5]  Covid Evaluation: Asymptomatic - no recent exposure (last 10 days) testing not required  Diagnosis: Acute on chronic diastolic (congestive) heart failure [facility]) [[id number]]  Admitting Physician: Onnie Boer [[id number]]  Attending Physician: Onnie Boer [email or web address]          B Medical/Surgery History Past Medical History:  Diagnosis Date   Asthma    Chronic back pain    Depression    History of uterine cancer    History of vertigo    Hypertension    Normal LVF (EF 65%) by 2D Echo, August 2005. Mild left ventricular hypertrophy; mild mitral regurgitation   Hypertriglyceridemia    Morbid obesity (HCC)    Peripheral neuropathy    Past Surgical History:  Procedure Laterality Date   CARPAL TUNNEL RELEASE  07/30/2006   REPLACEMENT TOTAL KNEE  Left    TOTAL ABDOMINAL HYSTERECTOMY W/ BILATERAL SALPINGOOPHORECTOMY     uterine cancer     A IV Location/Drains/Wounds Patient Lines/Drains/Airways Status     Active Line/Drains/Airways     Name Placement date Placement time Site Days   Peripheral IV 09/01/22 22 G 1.88" Left;Anterior Forearm 09/01/22  1941  Forearm  less than 1            Intake/Output Last 24 hours No intake or output data in the 24 hours ending 09/01/22 2011  Labs/Imaging Results for orders placed or performed during the hospital encounter of 09/01/22 (from the past 48 hour(s))  Lipase, blood     Status: Abnormal   Collection Time: 09/01/22  1:32 PM  Result Value Ref Range   Lipase 68 (H) 11 - 51 U/L    Comment: Performed at [facility], [address], [place], [place] [address]  Comprehensive metabolic panel     Status: Abnormal   Collection Time: 09/01/22  1:32 PM  Result Value Ref Range   Sodium 137 135 - 145 mmol/L   Potassium 4.4 3.5 - 5.1 mmol/L   Chloride 96 (L) 98 - 111 mmol/L   CO2 32 22 - 32 mmol/L   Glucose, Bld 175 (H) 70 - 99 mg/dL    Comment: Glucose reference range applies only to samples taken after fasting for at least 8 hours.   BUN 24 (H) 8 - 23 mg/dL   Creatinine,  Ser 0.78 0.44 - 1.00 mg/dL   Calcium 8.9 8.9 - 16.1 mg/dL   Total Protein 7.7 6.5 - 8.1 g/dL   Albumin 3.2 (L) 3.5 - 5.0 g/dL   AST 29 15 - 41 U/L   ALT 29 0 - 44 U/L   Alkaline Phosphatase 62 38 - 126 U/L   Total Bilirubin 1.2 0.3 - 1.2 mg/dL   GFR, Estimated >09 >60 mL/min    Comment: (NOTE) Calculated using the CKD-EPI Creatinine Equation (2021)    Anion gap 9 5 - 15    Comment: Performed at [facility], [address], [place], [place] [address]  CBC     Status: Abnormal   Collection Time: 09/01/22  1:32 PM  Result Value Ref Range   WBC 5.3 4.0 - 10.5 K/uL   RBC 3.99 3.87 - 5.11 MIL/uL   Hemoglobin 12.9 12.0 - 15.0 g/dL   HCT 81.1 91.4 - 78.2 %   MCV 97.5 80.0 - 100.0 fL   MCH 32.3 26.0 - 34.0 pg    MCHC 33.2 30.0 - 36.0 g/dL   RDW 95.6 (H) 21.3 - 08.6 %   Platelets 121 (L) 150 - 400 K/uL   nRBC 0.0 0.0 - 0.2 %    Comment: Performed at [facility], [address], [place], [place] [address]  D-dimer, quantitative     Status: Abnormal   Collection Time: 09/01/22  2:29 PM  Result Value Ref Range   D-Dimer, Quant 2.55 (H) 0.00 - 0.50 ug/mL-FEU    Comment: (NOTE) At the manufacturer cut-off value of 0.5 g/mL FEU, this assay has a negative predictive value of 95-100%.This assay is intended for use in conjunction with a clinical pretest probability (PTP) assessment model to exclude pulmonary embolism (PE) and deep venous thrombosis (DVT) in outpatients suspected of PE or DVT. Results should be correlated with clinical presentation. Performed at [facility], [address], [place], [place] [address]   Troponin I (High Sensitivity)     Status: None   Collection Time: 09/01/22  3:17 PM  Result Value Ref Range   Troponin I (High Sensitivity) 3 <18 ng/L    Comment: (NOTE) Elevated high sensitivity troponin I (hsTnI) values and significant  changes across serial measurements may suggest ACS but many other  chronic and acute conditions are known to elevate hsTnI results.  Refer to the "Links" section for chest pain algorithms and additional  guidance. Performed at [facility], [address], [place], [place] [address]   Urinalysis, Routine w reflex microscopic -Urine, Clean Catch     Status: Abnormal   Collection Time: 09/01/22  5:00 PM  Result Value Ref Range   Color, Urine YELLOW YELLOW   APPearance CLOUDY (A) CLEAR   Specific Gravity, Urine 1.018 1.005 - 1.030   pH 8.0 5.0 - 8.0   Glucose, UA NEGATIVE NEGATIVE mg/dL   Hgb urine dipstick MODERATE (A) NEGATIVE   Bilirubin Urine NEGATIVE NEGATIVE   Ketones, ur NEGATIVE NEGATIVE mg/dL   Protein, ur 244 (A) NEGATIVE mg/dL   Nitrite NEGATIVE NEGATIVE   Leukocytes,Ua LARGE (A) NEGATIVE   RBC / HPF >50 0 - 5 RBC/hpf   WBC, UA >50 0  - 5 WBC/hpf   Bacteria, UA RARE (A) NONE SEEN   Squamous Epithelial / HPF 0-5 0 - 5 /HPF   Budding Yeast PRESENT     Comment: Performed at [facility], [address], [place], [place] [address]   DG Chest Portable 1 View  Result Date: 09/01/2022 CLINICAL DATA:  Shortness of breath EXAM: PORTABLE CHEST 1 VIEW COMPARISON:  X-ray 09/17/2021 and older FINDINGS: Enlarged cardiopericardial silhouette with vascular congestion. No pneumothorax, effusion or edema. Films are under penetrated. No consolidation. Overlapping cardiac leads. Calcified aorta. IMPRESSION: Enlarged cardiopericardial silhouette with vascular congestion. Electronically Signed   By: Karen Kays M.D.   On: 09/01/2022 17:50   US Venous Img Lower Bilateral  Result Date: 09/01/2022 CLINICAL DATA:  Bilateral lower extremity edema EXAM: BILATERAL LOWER EXTREMITY VENOUS DOPPLER ULTRASOUND TECHNIQUE: Gray-scale sonography with compression, as well as color and duplex ultrasound, were performed to evaluate the deep venous system(s) from the level of the common femoral vein through the popliteal and proximal calf veins. COMPARISON:  None Available. FINDINGS: VENOUS Normal compressibility of the common femoral, superficial femoral, and popliteal veins, as well as the visualized calf veins. Visualized portions of profunda femoral vein and great saphenous vein unremarkable. No filling defects to suggest DVT on grayscale or color Doppler imaging. Doppler waveforms show normal direction of venous flow, normal respiratory plasticity and response to augmentation. OTHER Subcutaneous soft tissue edema overlying the bilateral calf. Limitations: none IMPRESSION: Negative. Electronically Signed   By: Agustin Cree M.D.   On: 09/01/2022 17:46    Pending Labs Unresulted Labs (From admission, onward)     Start     Ordered   09/01/22 2008  Brain natriuretic peptide  Once,   R        09/01/22 2007   09/01/22 1928  Urine Culture  Once,   URGENT       Question:   Indication  Answer:  Dysuria   09/01/22 1927            Vitals/Pain Today's Vitals   09/01/22 1324 09/01/22 1811 09/01/22 1825 09/01/22 1945  BP:  (!) 168/72  (!) 125/52  Pulse:  (!) 58  (!) 58  Resp:  20  19  Temp:   98.6 F (37 C)   TempSrc:   Oral   SpO2: 98% 99%  100%  Weight:      Height:      PainSc:        Isolation Precautions No active isolations  Medications Medications  cefTRIAXone (ROCEPHIN) 1 g in sodium chloride 0.9 % 100 mL IVPB (1 g Intravenous New Bag/Given 09/01/22 1946)    Mobility walks with device     Focused Assessments Pulmonary Assessment Handoff:  Lung sounds:   O2 Device: Nasal Cannula O2 Flow Rate (L/min): 3 L/min    R Recommendations: See Admitting Provider Note  Report given to:   Additional Notes:  Pt is [facility]

## 2022-09-01 NOTE — ED Provider Notes (Signed)
[place] EMERGENCY DEPARTMENT AT [facility] Provider Note   CSN: [id number] Arrival date & time: 09/01/22  1305     History  Chief Complaint  Patient presents with   Hematuria    Jenna Sherman is a 72 y.o. female.   Hematuria  Patient sent in for shortness of breath leg swelling dyspnea and hematuria.  Inpatient treatment recommended.  Has refused in the past.  Reviewed cardiology note.  Has had positive D-dimer as an outpatient.  VQ scan ordered but not been done yet.  Patient states she is now willing to be admitted to the hospital.  Cardiology recommendations below.  Upon arrival to the ED, I recommend the following be done/obtained: 1.  Twelve-lead EKG and close monitoring of vital signs 2.  Labs including the following: CBC, CMET, troponins, and D-dimer 3.  Imaging including VQ scan and two-view chest x-ray, update echocardiogram, obtain lower extremity Dopplers 4.  Consult cardiology, may need to be transferred to [facility] to undergo previously recommended right and left heart catheterization. 5.  Consult PT/OT for generalized weakness, may qualify for SNF for short-term therapy at discharge. 6.  Discharge when in stable condition.  Follow-up with outpatient cardiology 1 to 2 weeks post discharge.      Past Medical History:  Diagnosis Date   Asthma    Chronic back pain    Depression    History of uterine cancer    History of vertigo    Hypertension    Normal LVF (EF 65%) by 2D Echo, August 2005. Mild left ventricular hypertrophy; mild mitral regurgitation   Hypertriglyceridemia    Morbid obesity (HCC)    Peripheral neuropathy     Home Medications Prior to Admission medications   Medication Sig Start Date End Date Taking? Authorizing Provider  albuterol (PROVENTIL) (2.5 MG/3ML) 0.083% nebulizer solution Take 2.5 mg by nebulization every 4 (four) hours as needed for wheezing or shortness of breath. 10/15/17   [provider]   cetirizine (ZYRTEC) 10 MG tablet Take 1 tablet by mouth daily.    [provider]  clonazePAM (KLONOPIN) 0.5 MG tablet Take 0.5 mg by mouth every 8 (eight) hours as needed for anxiety.    [provider]  cyclobenzaprine (FLEXERIL) 10 MG tablet Take 15 mg by mouth at bedtime.    [provider]  diclofenac (VOLTAREN) 75 MG EC tablet Take 75 mg by mouth 2 (two) times daily.    [provider]  escitalopram (LEXAPRO) 20 MG tablet Take 20 mg by mouth at bedtime. 08/29/21   [provider]  fluticasone furoate-vilanterol (BREO ELLIPTA) 200-25 MCG/ACT AEPB Inhale 1 puff into the lungs daily.    [provider]  furosemide (LASIX) 40 MG tablet Take 1 tablet (40 mg total) by mouth daily. 06/11/22   Antoine Poche, MD  gabapentin (NEURONTIN) 300 MG capsule Take 600 mg by mouth at bedtime.    [provider]  meclizine (ANTIVERT) 25 MG tablet Take 25 mg by mouth 3 (three) times daily as needed for dizziness.    [provider]  metoprolol succinate (TOPROL-XL) 50 MG 24 hr tablet Take 50 mg by mouth daily. 09/17/21   [provider]  nitroGLYCERIN (NITROSTAT) 0.4 MG SL tablet Place 1 tablet (0.4 mg total) under the tongue every 5 (five) minutes x 3 doses as needed for chest pain (if no relief after 3rd dose, proceed to ED for evalution or call 911). 07/21/22   Philis Nettle,  Lanora Manis, NP  [facility] powder SMARTSIG:5 Gram(s) Topical 4 Times Daily 05/13/22   [provider]      Allergies    Ivp dye [iodinated contrast media]    Review of Systems   Review of Systems  Genitourinary:  Positive for hematuria.    Physical Exam Updated Vital Signs BP (!) 168/72   Pulse (!) 58   Temp 98.6 F (37 C) (Oral)   Resp 20   Ht 4\' 8"  (1.422 m)   Wt 124.3 kg   SpO2 99%   BMI 61.43 kg/m  Physical Exam Vitals reviewed.  HENT:     Head: Normocephalic.  Cardiovascular:     Rate and Rhythm: Normal rate.  Abdominal:      Tenderness: There is abdominal tenderness.     Comments: Mild suprapubic to left lower quadrant tenderness.  Musculoskeletal:     Right lower leg: Edema present.     Left lower leg: Edema present.     Comments: Pitting edema bilateral lower extremities.  Some warmth on left foot.  Skin:    Capillary Refill: Capillary refill takes less than 2 seconds.  Neurological:     Mental Status: She is alert. Mental status is at baseline.   Patient is on chronic oxygen.  ED Results / Procedures / Treatments   Labs (all labs ordered are listed, but only abnormal results are displayed) Labs Reviewed  URINALYSIS, ROUTINE W REFLEX MICROSCOPIC - Abnormal; Notable for the following components:      Result Value   APPearance CLOUDY (*)    Hgb urine dipstick MODERATE (*)    Protein, ur 100 (*)    Leukocytes,Ua LARGE (*)    Bacteria, UA RARE (*)    All other components within normal limits  LIPASE, BLOOD - Abnormal; Notable for the following components:   Lipase 68 (*)    All other components within normal limits  COMPREHENSIVE METABOLIC PANEL - Abnormal; Notable for the following components:   Chloride 96 (*)    Glucose, Bld 175 (*)    BUN 24 (*)    Albumin 3.2 (*)    All other components within normal limits  CBC - Abnormal; Notable for the following components:   RDW 15.9 (*)    Platelets 121 (*)    All other components within normal limits  D-DIMER, QUANTITATIVE (NOT AT [facility]) - Abnormal; Notable for the following components:   D-Dimer, Quant 2.55 (*)    All other components within normal limits  URINE CULTURE  TROPONIN I (HIGH SENSITIVITY)    EKG None  Radiology DG Chest Portable 1 View  Result Date: 09/01/2022 CLINICAL DATA:  Shortness of breath EXAM: PORTABLE CHEST 1 VIEW COMPARISON:  X-ray 09/17/2021 and older FINDINGS: Enlarged cardiopericardial silhouette with vascular congestion. No pneumothorax, effusion or edema. Films are under penetrated. No consolidation. Overlapping  cardiac leads. Calcified aorta. IMPRESSION: Enlarged cardiopericardial silhouette with vascular congestion. Electronically Signed   By: Karen Kays M.D.   On: 09/01/2022 17:50   US Venous Img Lower Bilateral  Result Date: 09/01/2022 CLINICAL DATA:  Bilateral lower extremity edema EXAM: BILATERAL LOWER EXTREMITY VENOUS DOPPLER ULTRASOUND TECHNIQUE: Gray-scale sonography with compression, as well as color and duplex ultrasound, were performed to evaluate the deep venous system(s) from the level of the common femoral vein through the popliteal and proximal calf veins. COMPARISON:  None Available. FINDINGS: VENOUS Normal compressibility of the common femoral, superficial femoral, and popliteal veins, as well as the visualized calf veins. Visualized portions  of profunda femoral vein and great saphenous vein unremarkable. No filling defects to suggest DVT on grayscale or color Doppler imaging. Doppler waveforms show normal direction of venous flow, normal respiratory plasticity and response to augmentation. OTHER Subcutaneous soft tissue edema overlying the bilateral calf. Limitations: none IMPRESSION: Negative. Electronically Signed   By: Agustin Cree M.D.   On: 09/01/2022 17:46    Procedures Procedures    Medications Ordered in ED Medications - No data to display  ED Course/ Medical Decision Making/ A&P                             Medical Decision Making Amount and/or Complexity of Data Reviewed Labs: ordered. Radiology: ordered.   Patient is shortness of breath edema swelling weakness.  Sent in for admission.  Has had positive outpatient D-dimer.  Plan for outpatient CT angio that was ordered but apparently has allergy.  Sent in from cardiology for VQ scan admission echocardiogram and potentially left and right heart catheterization.  Also has had dysuria.  Urine shows infection.. Reviewed previous cardiology note.  Does have some vascular congestion on x-ray.  Negative Dopplers.  Will require  admission to the hospital and will discuss with hospitalist.        Final Clinical Impression(s) / ED Diagnoses Final diagnoses:  Acute cystitis without hematuria  Dyspnea, unspecified type    Rx / DC Orders ED Discharge Orders     None         Benjiman Core, MD 09/01/22 1930

## 2022-09-01 NOTE — ED Provider Triage Note (Signed)
Emergency Medicine Provider Triage Evaluation Note  Jenna Sherman , a 72 y.o. female  was evaluated in triage.  Pt complains of chest pain and shortness of breath, worsening lower extremity edema along with urinary incontinence with dysuria.  Presents from her cardiology office at the recommendation for inpatient evaluation for PE versus unstable angina.  She had an elevated D-dimer when seen by her primary provider May 28 at 0.71, VQ scan was ordered at that time but had not been completed yet.  Review of Systems  Positive: Chest pain, shortness of breath, peripheral edema Negative: Fever, palpitations, back pain, abdominal pain  Physical Exam  BP 134/68   Pulse 65   Temp 98.8 F (37.1 C)   Resp (!) 22   Ht 4\' 8"  (1.422 m)   Wt 124.3 kg   SpO2 98%   BMI 61.43 kg/m  Gen:   Awake, no distress   Resp:  Normal effort on 3 L nasal cannula, chronic home O2 x 3 weeks. MSK:   Moves extremities without difficulty significant bilateral peripheral edema, dusky appearance in toes. Other:    Medical Decision Making  Medically screening exam initiated at 2:05 PM.  Appropriate orders placed.  Jenna Sherman was informed that the remainder of the evaluation will be completed by another provider, this initial triage assessment does not replace that evaluation, and the importance of remaining in the ED until their evaluation is complete.     Burgess Amor, PA-C 09/01/22 1408

## 2022-09-01 NOTE — Assessment & Plan Note (Signed)
Stable. -Monitor with diuresis, resume metoprolol

## 2022-09-02 ENCOUNTER — Inpatient Hospital Stay (HOSPITAL_COMMUNITY): Payer: Medicare Other

## 2022-09-02 ENCOUNTER — Encounter (HOSPITAL_COMMUNITY): Payer: Self-pay | Admitting: Internal Medicine

## 2022-09-02 DIAGNOSIS — I200 Unstable angina: Secondary | ICD-10-CM

## 2022-09-02 DIAGNOSIS — I5033 Acute on chronic diastolic (congestive) heart failure: Secondary | ICD-10-CM | POA: Diagnosis not present

## 2022-09-02 LAB — HEPARIN LEVEL (UNFRACTIONATED): Heparin Unfractionated: <0.1 IU/mL — ABNORMAL LOW (ref 0.30–0.70)

## 2022-09-02 LAB — BASIC METABOLIC PANEL
Anion gap: 7 (ref 5–15)
BUN: 20 mg/dL (ref 8–23)
CO2: 33 mmol/L — ABNORMAL HIGH (ref 22–32)
Calcium: 8.5 mg/dL — ABNORMAL LOW (ref 8.9–10.3)
Chloride: 95 mmol/L — ABNORMAL LOW (ref 98–111)
Creatinine, Ser: 0.67 mg/dL (ref 0.44–1.00)
GFR, Estimated: >60 mL/min (ref >60)
Glucose, Bld: 142 mg/dL — ABNORMAL HIGH (ref 70–99)
Potassium: 3.8 mmol/L (ref 3.5–5.1)
Sodium: 135 mmol/L (ref 135–145)

## 2022-09-02 LAB — HIV ANTIBODY (ROUTINE TESTING W REFLEX): HIV Screen 4th Generation wRfx: NONREACTIVE

## 2022-09-02 MED ORDER — FLUCONAZOLE 200 MG PO TABS
200.0000 mg | ORAL_TABLET | Freq: Every day | ORAL | Status: AC
  Administered 2022-09-02 – 2022-09-04 (×3): 200 mg via ORAL
  Filled 2022-09-02 (×2): qty 2
  Filled 2022-09-02: qty 1

## 2022-09-02 MED ORDER — HEPARIN BOLUS VIA INFUSION
4000.0000 [IU] | Freq: Once | INTRAVENOUS | Status: AC
  Administered 2022-09-02: 4000 [IU] via INTRAVENOUS
  Filled 2022-09-02: qty 4000

## 2022-09-02 MED ORDER — ATORVASTATIN CALCIUM 40 MG PO TABS
40.0000 mg | ORAL_TABLET | Freq: Every day | ORAL | Status: DC
  Administered 2022-09-02 – 2022-09-04 (×3): 40 mg via ORAL
  Filled 2022-09-02 (×3): qty 1

## 2022-09-02 MED ORDER — HEPARIN (PORCINE) 25000 UT/250ML-% IV SOLN
1500.0000 [IU]/h | INTRAVENOUS | Status: DC
  Administered 2022-09-02: 1000 [IU]/h via INTRAVENOUS
  Administered 2022-09-03: 1200 [IU]/h via INTRAVENOUS
  Filled 2022-09-02 (×2): qty 250

## 2022-09-02 MED ORDER — NITROGLYCERIN 0.2 MG/HR TD PT24
0.2000 mg | MEDICATED_PATCH | Freq: Every day | TRANSDERMAL | Status: DC
  Administered 2022-09-02 – 2022-09-04 (×3): 0.2 mg via TRANSDERMAL
  Filled 2022-09-02 (×2): qty 1

## 2022-09-02 MED ORDER — TECHNETIUM TO 99M ALBUMIN AGGREGATED
4.0000 | Freq: Once | INTRAVENOUS | Status: AC | PRN
  Administered 2022-09-02: 4.1 via INTRAVENOUS

## 2022-09-02 MED ORDER — HEPARIN BOLUS VIA INFUSION
2100.0000 [IU] | Freq: Once | INTRAVENOUS | Status: AC
  Administered 2022-09-02: 2100 [IU] via INTRAVENOUS
  Filled 2022-09-02: qty 2100

## 2022-09-02 NOTE — Progress Notes (Signed)
ANTICOAGULATION CONSULT NOTE - Initial Consult  Pharmacy Consult for heparin Indication: chest pain/ACS  Allergies  Allergen Reactions   Ivp Dye [Iodinated Contrast Media]     Patient Measurements: Height: 4\' 8"  (142.2 cm) Weight: 123.1 kg (271 lb 6.2 oz) IBW/kg (Calculated) : 36.3 Heparin Dosing Weight: 70 kg  Vital Signs: Temp: 98.3 F (36.8 C) (06/04 0606) BP: 118/52 (06/04 0606) Pulse Rate: 57 (06/04 0606)  Labs: Recent Labs    09/01/22 1332 09/01/22 1517 09/01/22 2020 09/02/22 0435  HGB 12.9  --   --   --   HCT 38.9  --   --   --   PLT 121*  --   --   --   CREATININE 0.78  --   --  0.67  TROPONINIHS  --  3 4  --     Estimated Creatinine Clearance: 73.3 mL/min (by C-G formula based on SCr of 0.67 mg/dL).   Medical History: Past Medical History:  Diagnosis Date   Asthma    Chronic back pain    Depression    History of uterine cancer    History of vertigo    Hypertension    Normal LVF (EF 65%) by 2D Echo, August 2005. Mild left ventricular hypertrophy; mild mitral regurgitation   Hypertriglyceridemia    Morbid obesity (HCC)    Peripheral neuropathy     Medications:  Medications Prior to Admission  Medication Sig Dispense Refill Last Dose   clonazePAM (KLONOPIN) 0.5 MG tablet Take 0.5 mg by mouth every 8 (eight) hours as needed for anxiety.   08/31/2022   cyclobenzaprine (FLEXERIL) 10 MG tablet Take 15 mg by mouth at bedtime.   08/31/2022   escitalopram (LEXAPRO) 20 MG tablet Take 20 mg by mouth at bedtime.   08/31/2022   fluticasone furoate-vilanterol (BREO ELLIPTA) 200-25 MCG/ACT AEPB Inhale 1 puff into the lungs daily.   08/31/2022   furosemide (LASIX) 40 MG tablet Take 1 tablet (40 mg total) by mouth daily. 90 tablet 1 08/31/2022   gabapentin (NEURONTIN) 300 MG capsule Take 600 mg by mouth at bedtime.   08/31/2022   meclizine (ANTIVERT) 25 MG tablet Take 25 mg by mouth 3 (three) times daily as needed for dizziness.   08/31/2022   metoprolol succinate (TOPROL-XL)  50 MG 24 hr tablet Take 50 mg by mouth daily.   08/31/2022   NYAMYC powder SMARTSIG:5 Gram(s) Topical 4 Times Daily   09/01/2022   albuterol (PROVENTIL) (2.5 MG/3ML) 0.083% nebulizer solution Take 2.5 mg by nebulization every 4 (four) hours as needed for wheezing or shortness of breath.   Unknown   cetirizine (ZYRTEC) 10 MG tablet Take 1 tablet by mouth daily.   Unknown   diclofenac (VOLTAREN) 75 MG EC tablet Take 75 mg by mouth 2 (two) times daily.   Unknown   nitroGLYCERIN (NITROSTAT) 0.4 MG SL tablet Place 1 tablet (0.4 mg total) under the tongue every 5 (five) minutes x 3 doses as needed for chest pain (if no relief after 3rd dose, proceed to ED for evalution or call 911). 30 tablet 2 Unknown    Assessment: Pharmacy consulted to dose heparin in patient with unstable angina.  Patient is not on anticoagulation prior to admission but received dose of Lovenox 40 mg 6/3 @ 2248.  Plan is for cath.  D-Dimer 2.55- CT angio pending  Goal of Therapy:  Heparin level 0.3-0.7 units/ml Monitor platelets by anticoagulation protocol: Yes   Plan:  Give 4000 units bolus x 1  Start heparin infusion at 1000 units/hr Check anti-Xa level in 6-8 hours and daily while on heparin Continue to monitor H&H and platelets  Judeth Cornfield, PharmD Clinical Pharmacist 09/02/2022 12:59 PM

## 2022-09-02 NOTE — Progress Notes (Signed)
PROGRESS NOTE     Jenna Sherman, is a 72 y.o. female, DOB - 03-11-1951, ZOX:[id number]  Admit date - 09/01/2022   Admitting Physician Onnie Boer, MD  Outpatient Primary MD for the patient is [name], [name], MD  LOS - 1  Chief Complaint  Patient presents with   Hematuria        Brief Narrative:  72 year old  WF with past medical history relevant for chronic hypoxic respiratory failure on 2L/min in the setting of chronic diastolic dysfunction CHF, hearing loss, morbid obesity, and HTN admitted from cardiology clinic on 09/01/2022 with exertional chest pain radiating to bilateral shoulders/upper arms as well as chronic DOE. - Evaluated by inpatient cardiology service on 09/02/2022 recommends transfer to [facility] for [facility]    -Assessment and Plan: 1)Chest Pains--ruled out for ACS by cardiac enzymes and EKG -VQ scan without PE and lower extremity venous Dopplers negative for DVT -Give aspirin, IV heparin, metoprolol and atorvastatin -Check lipid profile -Evaluated by inpatient cardiology service on 09/02/2022 recommends transfer to [facility] for [facility]  2)Acute on chronic diastolic (congestive) heart failure -POA Admitted with dyspnea on exertion, 1+ pitting bilateral lower extremity edema, chest x-ray showing-enlarged pericardial silhouette, with vascular congestion.  Patient has 10 pound weight gain over the past year.  Troponin 3.  D-dimer elevated at 2.55.  Last echo 09/2021, EF of 70 to 75% -VQ scan without PE (CT not available, also patient has contrast allergy) -Continue IV Lasix -Strict input output, daily weights,   3)Presumed UTI with hematuria---POA -Continue Rocephin pending culture data -Diflucan ordered for yeast  4)Chronic respiratory failure with hypoxia -POA -Chronic diastolic dysfunction CHF -PTA was on 2 L ,currently requiring 3 L per nasal cannula  5)HTN Stable. -Continue p.o. metoprolol and IV Lasix  6)Morbid Obesity- -Low calorie diet,  portion control and increase physical activity discussed with patient -Body mass index is 60.84 kg/m.  7)Presumed OSA----VQ scan negative -CPAP nightly -Awaiting outpatient sleep study  Status is: Inpatient   Disposition: The patient is from: Home              Anticipated d/c is to:  [facility] for [facility]              Anticipated d/c date is: 2 days              Patient currently is not medically stable to d/c. Barriers: Not Clinically Stable-   Code Status :  -  Code Status: Full Code   Family Communication:  (patient is alert, awake and coherent)  -Discussed with daughter Jenna Sherman at bedside  DVT Prophylaxis  :   - SCDs /IV heparin   Lab Results  Component Value Date   PLT 121 (L) 09/01/2022   Inpatient Medications  Scheduled Meds:  aspirin EC  81 mg Oral Daily   fluconazole  200 mg Oral Daily   fluticasone furoate-vilanterol  1 puff Inhalation Daily   furosemide  40 mg Intravenous Q12H   gabapentin  600 mg Oral QHS   metoprolol succinate  50 mg Oral Daily   nitroGLYCERIN  0.2 mg Transdermal Daily   nystatin   Topical BID   Continuous Infusions:  cefTRIAXone (ROCEPHIN)  IV 2 g (09/02/22 1654)   heparin 1,000 Units/hr (09/02/22 1323)   PRN Meds:.acetaminophen **OR** acetaminophen, clonazePAM, nitroGLYCERIN, ondansetron **OR** ondansetron (ZOFRAN) IV, polyethylene glycol   Anti-infectives (From admission, onward)    Start     Dose/Rate Route Frequency Ordered Stop  09/02/22 1700  cefTRIAXone (ROCEPHIN) 2 g in sodium chloride 0.9 % 100 mL IVPB        2 g 200 mL/hr over 30 Minutes Intravenous Every 24 hours 09/01/22 2048     09/02/22 1100  fluconazole (DIFLUCAN) tablet 200 mg        200 mg Oral Daily 09/02/22 1006 09/05/22 0959   09/01/22 2100  cefTRIAXone (ROCEPHIN) 1 g in sodium chloride 0.9 % 100 mL IVPB        1 g 200 mL/hr over 30 Minutes Intravenous  Once 09/01/22 2048 09/01/22 2327   09/01/22 1945  cefTRIAXone (ROCEPHIN) 1 g in sodium chloride 0.9 % 100 mL  IVPB        1 g 200 mL/hr over 30 Minutes Intravenous  Once 09/01/22 1930 09/01/22 2027       Subjective: Jenna Sherman today has no fevers, no emesis, -- dyspnea chest discomfort persist  -daughter Jenna Sherman at bedside--questions answered -Hematuria persist  Objective: Vitals:   09/01/22 2051 09/02/22 0147 09/02/22 0606 09/02/22 1332  BP: 138/68 (!) 114/46 (!) 118/52 (!) 119/56  Pulse: (!) 58 (!) 58 (!) 57 64  Resp: 19 18 16 18   Temp: 98.8 F (37.1 C) 97.9 F (36.6 C) 98.3 F (36.8 C) 98.8 F (37.1 C)  TempSrc: Oral   Oral  SpO2: 97% 98% 96% 100%  Weight: 123.5 kg  123.1 kg   Height: 4\' 8"  (1.422 m)       Intake/Output Summary (Last 24 hours) at 09/02/2022 1655 Last data filed at 09/02/2022 1300 Gross per 24 hour  Intake 720 ml  Output 2550 ml  Net -1830 ml   Filed Weights   09/01/22 1320 09/01/22 2051 09/02/22 0606  Weight: 124.3 kg 123.5 kg 123.1 kg   Physical Exam Gen:- Awake Alert,  in no apparent distress  HEENT:- [place].AT, No sclera icterus Ears-HOH Nose- [place] 3L/min Neck-Supple Neck,No JVD,.  Lungs-fair air movement, no significant wheezing  CV- S1, S2 normal, regular , 3/6 SM Abd-  +ve B.Sounds, Abd Soft, No tenderness, increased truncal adiposity    Extremity/Skin:- 1+ve  edema, chronic venous stasis, pedal pulses present  Psych-affect is appropriate, oriented x3 Neuro-generalized weakness, no new focal deficits, no tremors  Data Reviewed: I have personally reviewed following labs and imaging studies  CBC: Recent Labs  Lab 09/01/22 1332  WBC 5.3  HGB 12.9  HCT 38.9  MCV 97.5  PLT 121*   Basic Metabolic Panel: Recent Labs  Lab 09/01/22 1332 09/02/22 0435  NA 137 135  K 4.4 3.8  CL 96* 95*  CO2 32 33*  GLUCOSE 175* 142*  BUN 24* 20  CREATININE 0.78 0.67  CALCIUM 8.9 8.5*   GFR: Estimated Creatinine Clearance: 73.3 mL/min (by C-G formula based on SCr of 0.67 mg/dL). Liver Function Tests: Recent Labs  Lab 09/01/22 1332  AST 29  ALT 29   ALKPHOS 62  BILITOT 1.2  PROT 7.7  ALBUMIN 3.2*   Radiology Studies: NM Pulmonary Perfusion  Result Date: 09/02/2022 CLINICAL DATA:  Chest pain, nonspecific. Chronic shortness of breath. EXAM: NUCLEAR MEDICINE PERFUSION LUNG SCAN TECHNIQUE: Perfusion images were obtained in multiple projections after intravenous injection of radiopharmaceutical. Ventilation scans intentionally deferred if perfusion scan and chest x-ray adequate for interpretation during COVID 19 epidemic. RADIOPHARMACEUTICALS:  4.1 mCi Tc-3m MAA IV COMPARISON:  Chest x-ray 09/01/2022 FINDINGS: No segmental or subsegmental perfusion defects to suggest pulmonary embolus. IMPRESSION: No evidence of pulmonary embolus. Electronically Signed   By: Caryn Bee  [place] M.D.   On: 09/02/2022 12:45   DG Chest Portable 1 View  Result Date: 09/01/2022 CLINICAL DATA:  Shortness of breath EXAM: PORTABLE CHEST 1 VIEW COMPARISON:  X-ray 09/17/2021 and older FINDINGS: Enlarged cardiopericardial silhouette with vascular congestion. No pneumothorax, effusion or edema. Films are under penetrated. No consolidation. Overlapping cardiac leads. Calcified aorta. IMPRESSION: Enlarged cardiopericardial silhouette with vascular congestion. Electronically Signed   By: Karen Kays M.D.   On: 09/01/2022 17:50   US Venous Img Lower Bilateral  Result Date: 09/01/2022 CLINICAL DATA:  Bilateral lower extremity edema EXAM: BILATERAL LOWER EXTREMITY VENOUS DOPPLER ULTRASOUND TECHNIQUE: Gray-scale sonography with compression, as well as color and duplex ultrasound, were performed to evaluate the deep venous system(s) from the level of the common femoral vein through the popliteal and proximal calf veins. COMPARISON:  None Available. FINDINGS: VENOUS Normal compressibility of the common femoral, superficial femoral, and popliteal veins, as well as the visualized calf veins. Visualized portions of profunda femoral vein and great saphenous vein unremarkable. No filling defects to  suggest DVT on grayscale or color Doppler imaging. Doppler waveforms show normal direction of venous flow, normal respiratory plasticity and response to augmentation. OTHER Subcutaneous soft tissue edema overlying the bilateral calf. Limitations: none IMPRESSION: Negative. Electronically Signed   By: Agustin Cree M.D.   On: 09/01/2022 17:46    Scheduled Meds:  aspirin EC  81 mg Oral Daily   fluconazole  200 mg Oral Daily   fluticasone furoate-vilanterol  1 puff Inhalation Daily   furosemide  40 mg Intravenous Q12H   gabapentin  600 mg Oral QHS   metoprolol succinate  50 mg Oral Daily   nitroGLYCERIN  0.2 mg Transdermal Daily   nystatin   Topical BID   Continuous Infusions:  cefTRIAXone (ROCEPHIN)  IV 2 g (09/02/22 1654)   heparin 1,000 Units/hr (09/02/22 1323)    LOS: 1 day   Shon Hale M.D on 09/02/2022 at 4:55 PM  Go to [email or web address] - for contact info  [facility] - Office  [phone]  If 7PM-7AM, please contact night-coverage [email or web address] 09/02/2022, 4:55 PM

## 2022-09-02 NOTE — Consult Note (Addendum)
Cardiology Consultation   Patient ID: [name] MRN: [id number]; DOB: 14-May-1950  Admit date: 09/01/2022 Date of Consult: 09/02/2022  PCP:  Estanislado Pandy, MD   [facility] Providers Cardiologist:  Dina Rich, MD        Patient Profile:   Jenna Sherman is a 72 y.o. female with a hx of hyperlipidemia, hypertension, family history of CVD, history of chest pain, COPD, morbid obesity, bradycardia, peripheral neuropathy, history of uterine cancer who is being seen 09/02/2022 for the evaluation of chest pain and decompensated congestive heart failure at the request of Dr Mariea Clonts.  History of Present Illness:   Jenna Sherman had previously been evaluated in clinic by primary cardiologist Dr. Wyline Mood on 06/12/2022.  Chest pain lasted 1/2 minutes and radiated across her shoulders associated with rest or activity, worse with walking and associated shortness of breath with her symptoms.  Her chest pain had progressed since her last office visit.  She had called back wanting to proceed with the previously after mentioned left heart catheterization.  She was seen in clinic on 09/01/2022 by E. Philis Nettle, NP.  Patient is extremely hard of hearing and is a poor historian and most of the information came from her husband who stated that she been having chest pain at rest and with exertion.  She continues to have panic attacks and anxiety.  Stated that her feet and legs appear to look bigger recently.  She has home health that comes to the house as well.  Patient recently had seen Dr. Vassie Loll who obtained a D-dimer that was elevated, it was recommended that she proceed with a VQ scan but was not completed.  It was recommended at her office visit that she report to the emergency department for further evaluation based on worsening symptoms.  She presented to  [facility] emergency department on 09/01/2022 for the evaluation of hematuria and a "blockage of unknown location in nature".  Patient was noted to have significant  redness and edema to her bilateral lower extremities and feet.  She reported pain in her head, abdomen, legs, and feet.  She also reported burning urination and bladder pain.  Oxygen saturation was noted to be 87% on room air triage.  Patient has she typically would wear 3 L.  Oxygen at all times but he forgot to bring her oxygen tank to subsequently was placed on oxygen.  She was positive for hematuria and abdominal tenderness as well as edema to her bilateral lower extremities.  Initial vital signs: Blood pressure 168/72, pulse 58, respirations 20, temperature 98.6  Pertinent lab results: Lipase 68, chloride 96, blood glucose 175, BUN 24, albumin 3.2, platelets 421, D-dimer 2.55, high-sensitivity troponin 3 and 4, BNP 140, urinalysis was cloudy, moderate blood, protein, large leukocytes, and rare bacteria  Imaging: Duplex of bilateral lower extremities was negative for DVT; chest x-ray demonstrated enlarged cardiopericardial silhouette with vascular congestion; VQ scan completed this morning with results pending  Medications administered in the emergency department: Per H&P patient received sublingual nitro and has been chest pain-free since that time and IV ceftriaxone 1 g IVP  Cardiology was consulted for concerns of chest pain and decompensated CHF.   Past Medical History:  Diagnosis Date   Asthma    Chronic back pain    Depression    History of uterine cancer    History of vertigo    Hypertension    Normal LVF (EF 65%) by 2D Echo, August 2005. Mild left ventricular hypertrophy;  mild mitral regurgitation   Hypertriglyceridemia    Morbid obesity (HCC)    Peripheral neuropathy     Past Surgical History:  Procedure Laterality Date   CARPAL TUNNEL RELEASE  07/30/2006   REPLACEMENT TOTAL KNEE Left    TOTAL ABDOMINAL HYSTERECTOMY W/ BILATERAL SALPINGOOPHORECTOMY     uterine cancer     Home Medications:  Prior to Admission medications   Medication Sig Start Date End Date Taking?  Authorizing Provider  clonazePAM (KLONOPIN) 0.5 MG tablet Take 0.5 mg by mouth every 8 (eight) hours as needed for anxiety.   Yes [provider]  cyclobenzaprine (FLEXERIL) 10 MG tablet Take 15 mg by mouth at bedtime.   Yes [provider]  escitalopram (LEXAPRO) 20 MG tablet Take 20 mg by mouth at bedtime. 08/29/21  Yes [provider]  fluticasone furoate-vilanterol (BREO ELLIPTA) 200-25 MCG/ACT AEPB Inhale 1 puff into the lungs daily.   Yes [provider]  furosemide (LASIX) 40 MG tablet Take 1 tablet (40 mg total) by mouth daily. 06/11/22  Yes BranchDorothe Pea, MD  gabapentin (NEURONTIN) 300 MG capsule Take 600 mg by mouth at bedtime.   Yes [provider]  meclizine (ANTIVERT) 25 MG tablet Take 25 mg by mouth 3 (three) times daily as needed for dizziness.   Yes [provider]  metoprolol succinate (TOPROL-XL) 50 MG 24 hr tablet Take 50 mg by mouth daily. 09/17/21  Yes [provider]  [facility] powder SMARTSIG:5 Gram(s) Topical 4 Times Daily 05/13/22  Yes [provider]  albuterol (PROVENTIL) (2.5 MG/3ML) 0.083% nebulizer solution Take 2.5 mg by nebulization every 4 (four) hours as needed for wheezing or shortness of breath. 10/15/17   [provider]  cetirizine (ZYRTEC) 10 MG tablet Take 1 tablet by mouth daily.    [provider]  diclofenac (VOLTAREN) 75 MG EC tablet Take 75 mg by mouth 2 (two) times daily.    [provider]  nitroGLYCERIN (NITROSTAT) 0.4 MG SL tablet Place 1 tablet (0.4 mg total) under the tongue every 5 (five) minutes x 3 doses as needed for chest pain (if no relief after 3rd dose, proceed to ED for evalution or call 911). 07/21/22   Sharlene Dory, NP    Inpatient Medications: Scheduled Meds:  aspirin EC  81 mg Oral Daily   enoxaparin (LOVENOX) injection  40 mg Subcutaneous Q24H   fluconazole  200 mg Oral Daily   fluticasone furoate-vilanterol  1 puff Inhalation Daily    furosemide  40 mg Intravenous Q12H   gabapentin  600 mg Oral QHS   metoprolol succinate  50 mg Oral Daily   nystatin   Topical BID   Continuous Infusions:  cefTRIAXone (ROCEPHIN)  IV     PRN Meds: acetaminophen **OR** acetaminophen, clonazePAM, nitroGLYCERIN, ondansetron **OR** ondansetron (ZOFRAN) IV, polyethylene glycol  Allergies:    Allergies  Allergen Reactions   Ivp Dye [Iodinated Contrast Media]     Social History:   Social History   Socioeconomic History   Marital status: Married    Spouse name: Not on file   Number of children: Not on file   Years of education: Not on file   Highest education level: Not on file  Occupational History   Not on file  Tobacco Use   Smoking status: Former    Years: 1    Types: Cigarettes    Quit date: 03/31/1968    Years since quitting: 54.4    Passive exposure: Never  Smokeless tobacco: Never  Substance and Sexual Activity   Alcohol use: Not Currently   Drug use: Not Currently   Sexual activity: Not on file  Other Topics Concern   Not on file  Social History Narrative   Not on file   Social Determinants of Health   Financial Resource Strain: Not on file  Food Insecurity: No Food Insecurity (09/01/2022)   Hunger Vital Sign    Worried About Running Out of Food in the Last Year: Never true    Ran Out of Food in the Last Year: Never true  Transportation Needs: Patient Declined (09/01/2022)   PRAPARE - Administrator, Civil Service (Medical): Patient declined    Lack of Transportation (Non-Medical): Patient declined  Physical Activity: Not on file  Stress: Not on file  Social Connections: Not on file  Intimate Partner Violence: Patient Declined (09/01/2022)   Humiliation, Afraid, Rape, and Kick questionnaire    Fear of Current or Ex-Partner: Patient declined    Emotionally Abused: Patient declined    Physically Abused: Patient declined    Sexually Abused: Patient declined    Family History:    Family History   Problem Relation Age of Onset   Heart attack Brother 4   Heart attack Father 59   CAD Brother    CAD Father    Heart disease Sister        x's 2     ROS:  Please see the history of present illness.  Review of Systems  Constitutional:  Positive for malaise/fatigue.  HENT:  Positive for hearing loss.   Respiratory:  Positive for shortness of breath.   Cardiovascular:  Positive for chest pain, palpitations and leg swelling.  Gastrointestinal:  Positive for abdominal pain.  Genitourinary:  Positive for hematuria.  Neurological:  Positive for weakness.    All other ROS reviewed and negative.     Physical Exam/Data:   Vitals:   09/01/22 2043 09/01/22 2051 09/02/22 0147 09/02/22 0606  BP:  138/68 (!) 114/46 (!) 118/52  Pulse:  (!) 58 (!) 58 (!) 57  Resp:  19 18 16   Temp:  98.8 F (37.1 C) 97.9 F (36.6 C) 98.3 F (36.8 C)  TempSrc:  Oral    SpO2: 98% 97% 98% 96%  Weight:  123.5 kg  123.1 kg  Height:  4\' 8"  (1.422 m)      Intake/Output Summary (Last 24 hours) at 09/02/2022 1252 Last data filed at 09/02/2022 0645 Gross per 24 hour  Intake 240 ml  Output 1550 ml  Net -1310 ml      09/02/2022    6:06 AM 09/01/2022    8:51 PM 09/01/2022    1:20 PM  Last 3 Weights  Weight (lbs) 271 lb 6.2 oz 272 lb 4.3 oz 274 lb  Weight (kg) 123.1 kg 123.5 kg 124.286 kg     Body mass index is 60.84 kg/m.  General:  Well nourished, well developed, in no acute distress, extremely hard of hearing HEENT: normal Neck: unable to accurately assess JVD due to body habitus Vascular: No carotid bruits; Distal pulses 2+ bilaterally Cardiac:  normal S1, S2; RRR; no murmur  Lungs:  clear to auscultation bilaterally, no wheezing, rhonchi or rales  Abd: soft, nontender, no hepatomegaly  Ext: 1-2+ edema to BLE Musculoskeletal:  No deformities, BUE and BLE strength normal and equal Skin: warm and dry  Neuro:  CNs 2-12 intact, no focal abnormalities noted Psych:  Normal affect   EKG:  The EKG was  personally reviewed and demonstrates: Sinus bradycardia with rate of 55, left axis deviation Telemetry:  Telemetry was personally reviewed and demonstrates: Sinus bradycardia to sinus rhythm with rates of 50-70  Relevant CV Studies: TTE 10/21/21 1. Left ventricular ejection fraction, by estimation, is 70 to 75%. The  left ventricle has hyperdynamic function. Left ventricular endocardial  border not optimally defined to evaluate regional wall motion. Left  ventricular diastolic parameters are  indeterminate. Elevated left atrial pressure. The average left ventricular  global longitudinal strain is -21.0 %. The global longitudinal strain is  normal.   2. Right ventricular systolic function is normal. The right ventricular  size is normal.   3. The mitral valve is normal in structure. No evidence of mitral valve  regurgitation. No evidence of mitral stenosis.   4. The aortic valve was not well visualized. There is mild calcification  of the aortic valve. There is mild thickening of the aortic valve. Aortic  valve regurgitation is not visualized. No aortic stenosis is present.   Laboratory Data:  High Sensitivity Troponin:   Recent Labs  Lab 09/01/22 1517 09/01/22 2020  TROPONINIHS 3 4     Chemistry Recent Labs  Lab 08/26/22 1357 09/01/22 1332 09/02/22 0435  NA 140 137 135  K 4.1 4.4 3.8  CL 100 96* 95*  CO2 25 32 33*  GLUCOSE 108* 175* 142*  BUN 20 24* 20  CREATININE 0.72 0.78 0.67  CALCIUM 9.0 8.9 8.5*  GFRNONAA  --  >60 >60  ANIONGAP  --  9 7    Recent Labs  Lab 09/01/22 1332  PROT 7.7  ALBUMIN 3.2*  AST 29  ALT 29  ALKPHOS 62  BILITOT 1.2   Lipids No results for input(s): "CHOL", "TRIG", "HDL", "LABVLDL", "LDLCALC", "CHOLHDL" in the last 168 hours.  Hematology Recent Labs  Lab 08/26/22 1357 09/01/22 1332  WBC 4.4 5.3  RBC 4.10 3.99  HGB 13.2 12.9  HCT 39.3 38.9  MCV 96 97.5  MCH 32.2 32.3  MCHC 33.6 33.2  RDW 14.5 15.9*  PLT 108* 121*   Thyroid  No results for input(s): "TSH", "FREET4" in the last 168 hours.  BNP Recent Labs  Lab 08/26/22 1357 09/01/22 2020  BNP 90.1 140.0*    DDimer  Recent Labs  Lab 08/26/22 1356 09/01/22 1429  DDIMER 0.71* 2.55*     Radiology/Studies:  NM Pulmonary Perfusion  Result Date: 09/02/2022 CLINICAL DATA:  Chest pain, nonspecific. Chronic shortness of breath. EXAM: NUCLEAR MEDICINE PERFUSION LUNG SCAN TECHNIQUE: Perfusion images were obtained in multiple projections after intravenous injection of radiopharmaceutical. Ventilation scans intentionally deferred if perfusion scan and chest x-ray adequate for interpretation during COVID 19 epidemic. RADIOPHARMACEUTICALS:  4.1 mCi Tc-58m MAA IV COMPARISON:  Chest x-ray 09/01/2022 FINDINGS: No segmental or subsegmental perfusion defects to suggest pulmonary embolus. IMPRESSION: No evidence of pulmonary embolus. Electronically Signed   By: Charlett Nose M.D.   On: 09/02/2022 12:45   DG Chest Portable 1 View  Result Date: 09/01/2022 CLINICAL DATA:  Shortness of breath EXAM: PORTABLE CHEST 1 VIEW COMPARISON:  X-ray 09/17/2021 and older FINDINGS: Enlarged cardiopericardial silhouette with vascular congestion. No pneumothorax, effusion or edema. Films are under penetrated. No consolidation. Overlapping cardiac leads. Calcified aorta. IMPRESSION: Enlarged cardiopericardial silhouette with vascular congestion. Electronically Signed   By: Karen Kays M.D.   On: 09/01/2022 17:50   US Venous Img Lower Bilateral  Result Date: 09/01/2022 CLINICAL DATA:  Bilateral lower extremity edema EXAM: BILATERAL  LOWER EXTREMITY VENOUS DOPPLER ULTRASOUND TECHNIQUE: Gray-scale sonography with compression, as well as color and duplex ultrasound, were performed to evaluate the deep venous system(s) from the level of the common femoral vein through the popliteal and proximal calf veins. COMPARISON:  None Available. FINDINGS: VENOUS Normal compressibility of the common femoral, superficial  femoral, and popliteal veins, as well as the visualized calf veins. Visualized portions of profunda femoral vein and great saphenous vein unremarkable. No filling defects to suggest DVT on grayscale or color Doppler imaging. Doppler waveforms show normal direction of venous flow, normal respiratory plasticity and response to augmentation. OTHER Subcutaneous soft tissue edema overlying the bilateral calf. Limitations: none IMPRESSION: Negative. Electronically Signed   By: Agustin Cree M.D.   On: 09/01/2022 17:46     Assessment and Plan:   Acute on chronic diastolic congestive heart failure -Presented with shortness of breath and swelling to bilateral lower extremities -BNP 140.0 - -1.3 L output/24 hours -Last echocardiogram 09/2021 revealed LVEF 70-75% -Currently receiving furosemide 40 mg IV twice daily -Continue on Toprol-XL 50 mg daily -Not a good candidate for SGLT2 inhibitors due to UTI and sedentary lifestyle -Daily weights, I's and O's, low-sodium diet  Chest pain concerning for unstable angina -Patient has been complaining of chest discomfort on and off for over a year -High-sensitivity troponins negative x 2 -No ischemic changes noted on EKG -Continue with telemetry monitoring -Continue on aspirin 81 mg daily -Previously been recommended for right and left heart catheterization -RHC on hold until results of VQ scan, LHC tomorrow tentatively -N.p.o. after midnight -Starting heparin infusion and nitropatch   Essential hypertension -Blood pressure 118/52 -PTA Toprol-XL restarted this morning -Vital signs per unit protocol  Chronic respiratory failure with hypoxia -Shortness of breath and hypoxia -Positive D-dimers x 2 -VQ scan completed this morning with results pending -Continued on oxygen via nasal cannula 3 L which is her baseline -Ultrasound of bilateral lower extremities negative for DVT  Urinary tract infection -Continued on antibiotic therapy -Previous concern with  hematuria -Daily CBC -Management per IM    Risk Assessment/Risk Scores:     TIMI Risk Score for Unstable Angina or Non-ST Elevation MI:   The patient's TIMI risk score is 4, which indicates a 20% risk of all cause mortality, new or recurrent myocardial infarction or need for urgent revascularization in the next 14 days.  New York Heart Association (NYHA) Functional Class NYHA Class II        For questions or updates, please contact [facility] Please consult [email or web address] for contact info under    Signed, Kaelan Emami, NP  09/02/2022 12:52 PM

## 2022-09-02 NOTE — Progress Notes (Signed)
Patient transported off the floor by radiology.

## 2022-09-02 NOTE — Progress Notes (Addendum)
ANTICOAGULATION CONSULT NOTE - follow-up  Pharmacy Consult for heparin Indication: chest pain/ACS  Allergies  Allergen Reactions   Ivp Dye [Iodinated Contrast Media]     Patient Measurements: Height: 4\' 8"  (142.2 cm) Weight: 123.1 kg (271 lb 6.2 oz) IBW/kg (Calculated) : 36.3 Heparin Dosing Weight: 70 kg  Vital Signs: Temp: 98.8 F (37.1 C) (06/04 1332) Temp Source: Oral (06/04 1332) BP: 119/56 (06/04 1332) Pulse Rate: 64 (06/04 1332)  Labs: Recent Labs    09/01/22 1332 09/01/22 1517 09/01/22 2020 09/02/22 0435 09/02/22 2010  HGB 12.9  --   --   --   --   HCT 38.9  --   --   --   --   PLT 121*  --   --   --   --   HEPARINUNFRC  --   --   --   --  <0.10*  CREATININE 0.78  --   --  0.67  --   TROPONINIHS  --  3 4  --   --      Estimated Creatinine Clearance: 73.3 mL/min (by C-G formula based on SCr of 0.67 mg/dL).   Medical History: Past Medical History:  Diagnosis Date   Asthma    Chronic back pain    Depression    History of uterine cancer    History of vertigo    Hypertension    Normal LVF (EF 65%) by 2D Echo, August 2005. Mild left ventricular hypertrophy; mild mitral regurgitation   Hypertriglyceridemia    Morbid obesity (HCC)    Peripheral neuropathy     Medications:  Medications Prior to Admission  Medication Sig Dispense Refill Last Dose   clonazePAM (KLONOPIN) 0.5 MG tablet Take 0.5 mg by mouth every 8 (eight) hours as needed for anxiety.   08/31/2022   cyclobenzaprine (FLEXERIL) 10 MG tablet Take 15 mg by mouth at bedtime.   08/31/2022   escitalopram (LEXAPRO) 20 MG tablet Take 20 mg by mouth at bedtime.   08/31/2022   fluticasone furoate-vilanterol (BREO ELLIPTA) 200-25 MCG/ACT AEPB Inhale 1 puff into the lungs daily.   08/31/2022   furosemide (LASIX) 40 MG tablet Take 1 tablet (40 mg total) by mouth daily. 90 tablet 1 08/31/2022   gabapentin (NEURONTIN) 300 MG capsule Take 600 mg by mouth at bedtime.   08/31/2022   meclizine (ANTIVERT) 25 MG tablet  Take 25 mg by mouth 3 (three) times daily as needed for dizziness.   08/31/2022   metoprolol succinate (TOPROL-XL) 50 MG 24 hr tablet Take 50 mg by mouth daily.   08/31/2022   NYAMYC powder SMARTSIG:5 Gram(s) Topical 4 Times Daily   09/01/2022   albuterol (PROVENTIL) (2.5 MG/3ML) 0.083% nebulizer solution Take 2.5 mg by nebulization every 4 (four) hours as needed for wheezing or shortness of breath.   Unknown   cetirizine (ZYRTEC) 10 MG tablet Take 1 tablet by mouth daily.   Unknown   diclofenac (VOLTAREN) 75 MG EC tablet Take 75 mg by mouth 2 (two) times daily.   Unknown   nitroGLYCERIN (NITROSTAT) 0.4 MG SL tablet Place 1 tablet (0.4 mg total) under the tongue every 5 (five) minutes x 3 doses as needed for chest pain (if no relief after 3rd dose, proceed to ED for evalution or call 911). 30 tablet 2 Unknown    Assessment: Pharmacy consulted to dose heparin in patient with unstable angina.  Patient is not on anticoagulation prior to admission but received dose of Lovenox 40 mg 6/3 @  2248.  Plan is for cath.  D-Dimer 2.55- CT angio pending  6/4 PM update: HL <0.1 No issues with gtt Patient was admitted with noted hematuria d/t a presumptive UTI. Cultures pending, on CTX. Bleeding was not reported as any worse.    Goal of Therapy:  Heparin level 0.3-0.7 units/ml Monitor platelets by anticoagulation protocol: Yes   Plan:  Heparin 2100 unit bolus given increase heparin infusion at 1200 units/hr Check anti-Xa level in 8 hours and daily while on heparin Continue to monitor H&H and platelets  Rikia Sukhu BS, PharmD, BCPS Clinical Pharmacist 09/02/2022 9:14 PM  Contact: [phone] after 3 PM  "Be curious, not judgmental..." -Debbora Dus

## 2022-09-02 NOTE — Progress Notes (Signed)
Transition of Care [facility]) - Inpatient Brief Assessment   Patient Details  Name: Jenna Sherman MRN: [id number] Date of Birth: [birth year]  Transition of Care [facility]) CM/SW Contact:    Annice Needy, LCSW Phone Number: 09/02/2022, 11:53 AM   Clinical Narrative: Transition of Care Department [facility]) has reviewed patient and no TOC needs have been identified at this time. We will continue to monitor patient advancement through interdisciplinary progression rounds. If new patient transition needs arise, please place a TOC consult.   Transition of Care Asessment: Insurance and Status: Insurance coverage has been reviewed Patient has primary care physician: Yes Home environment has been reviewed: from home with spouse Prior level of function:: has HH PT/OT/aide Prior/Current Home Services: Current home services (PT/OT/aide) Social Determinants of Health Reivew: SDOH reviewed no interventions necessary Readmission risk has been reviewed: Yes Transition of care needs: transition of care needs identified, TOC will continue to follow (will need resumption orders for HHPT/OT/aide)

## 2022-09-03 ENCOUNTER — Other Ambulatory Visit (HOSPITAL_COMMUNITY): Payer: Medicare Other

## 2022-09-03 ENCOUNTER — Other Ambulatory Visit: Payer: Self-pay

## 2022-09-03 ENCOUNTER — Inpatient Hospital Stay (HOSPITAL_COMMUNITY): Payer: Medicare Other

## 2022-09-03 ENCOUNTER — Encounter (HOSPITAL_COMMUNITY)
Admission: EM | Disposition: A | Discharge status: Home Health | Payer: Self-pay | Source: Home / Self Care | Attending: Internal Medicine

## 2022-09-03 ENCOUNTER — Encounter (HOSPITAL_COMMUNITY): Payer: Self-pay | Admitting: Internal Medicine

## 2022-09-03 DIAGNOSIS — R7989 Other specified abnormal findings of blood chemistry: Secondary | ICD-10-CM

## 2022-09-03 DIAGNOSIS — I5031 Acute diastolic (congestive) heart failure: Secondary | ICD-10-CM | POA: Diagnosis not present

## 2022-09-03 DIAGNOSIS — I5033 Acute on chronic diastolic (congestive) heart failure: Secondary | ICD-10-CM | POA: Diagnosis not present

## 2022-09-03 HISTORY — PX: RIGHT/LEFT HEART CATH AND CORONARY ANGIOGRAPHY: CATH118266

## 2022-09-03 LAB — ECHOCARDIOGRAM LIMITED
Area-P 1/2: 2.45 cm2
Height: 56 in
S' Lateral: 4.2 cm
Weight: 4275.16 oz

## 2022-09-03 LAB — POCT I-STAT EG7
Acid-Base Excess: 9 mmol/L — ABNORMAL HIGH (ref 0.0–2.0)
Acid-Base Excess: 10 mmol/L — ABNORMAL HIGH (ref 0.0–2.0)
Bicarbonate: 35.6 mmol/L — ABNORMAL HIGH (ref 20.0–28.0)
Bicarbonate: 36.1 mmol/L — ABNORMAL HIGH (ref 20.0–28.0)
Calcium, Ion: 1.12 mmol/L — ABNORMAL LOW (ref 1.15–1.40)
Calcium, Ion: 1.14 mmol/L — ABNORMAL LOW (ref 1.15–1.40)
HCT: 40 % (ref 36.0–46.0)
HCT: 40 % (ref 36.0–46.0)
Hemoglobin: 13.6 g/dL (ref 12.0–15.0)
Hemoglobin: 13.6 g/dL (ref 12.0–15.0)
O2 Saturation: 66 %
O2 Saturation: 70 %
Potassium: 4.2 mmol/L (ref 3.5–5.1)
Potassium: 4.2 mmol/L (ref 3.5–5.1)
Sodium: 136 mmol/L (ref 135–145)
Sodium: 136 mmol/L (ref 135–145)
TCO2: 37 mmol/L — ABNORMAL HIGH (ref 22–32)
TCO2: 38 mmol/L — ABNORMAL HIGH (ref 22–32)
pCO2, Ven: 55.4 mmHg (ref 44–60)
pCO2, Ven: 56.1 mmHg (ref 44–60)
pH, Ven: 7.416 (ref 7.25–7.43)
pH, Ven: 7.416 (ref 7.25–7.43)
pO2, Ven: 35 mmHg (ref 32–45)
pO2, Ven: 37 mmHg (ref 32–45)

## 2022-09-03 LAB — CBC
HCT: 38 % (ref 36.0–46.0)
Hemoglobin: 12.9 g/dL (ref 12.0–15.0)
MCH: 33.2 pg (ref 26.0–34.0)
MCHC: 33.9 g/dL (ref 30.0–36.0)
MCV: 97.7 fL (ref 80.0–100.0)
Platelets: 132 10*3/uL — ABNORMAL LOW (ref 150–400)
RBC: 3.89 MIL/uL (ref 3.87–5.11)
RDW: 15.9 % — ABNORMAL HIGH (ref 11.5–15.5)
WBC: 5.2 10*3/uL (ref 4.0–10.5)
nRBC: 0 % (ref 0.0–0.2)

## 2022-09-03 LAB — POCT I-STAT 7, (LYTES, BLD GAS, ICA,H+H)
Acid-Base Excess: 8 mmol/L — ABNORMAL HIGH (ref 0.0–2.0)
Bicarbonate: 33.8 mmol/L — ABNORMAL HIGH (ref 20.0–28.0)
Calcium, Ion: 1.1 mmol/L — ABNORMAL LOW (ref 1.15–1.40)
HCT: 39 % (ref 36.0–46.0)
Hemoglobin: 13.3 g/dL (ref 12.0–15.0)
O2 Saturation: 94 %
Potassium: 4.2 mmol/L (ref 3.5–5.1)
Sodium: 135 mmol/L (ref 135–145)
TCO2: 35 mmol/L — ABNORMAL HIGH (ref 22–32)
pCO2 arterial: 48.8 mmHg — ABNORMAL HIGH (ref 32–48)
pH, Arterial: 7.449 (ref 7.35–7.45)
pO2, Arterial: 68 mmHg — ABNORMAL LOW (ref 83–108)

## 2022-09-03 LAB — BASIC METABOLIC PANEL
Anion gap: 11 (ref 5–15)
BUN: 21 mg/dL (ref 8–23)
CO2: 31 mmol/L (ref 22–32)
Calcium: 8.9 mg/dL (ref 8.9–10.3)
Chloride: 93 mmol/L — ABNORMAL LOW (ref 98–111)
Creatinine, Ser: 0.77 mg/dL (ref 0.44–1.00)
GFR, Estimated: >60 mL/min (ref >60)
Glucose, Bld: 153 mg/dL — ABNORMAL HIGH (ref 70–99)
Potassium: 3.6 mmol/L (ref 3.5–5.1)
Sodium: 135 mmol/L (ref 135–145)

## 2022-09-03 LAB — LIPID PANEL
Cholesterol: 154 mg/dL (ref 0–200)
HDL: 35 mg/dL — ABNORMAL LOW (ref >40)
LDL Cholesterol: 87 mg/dL (ref 0–99)
Total CHOL/HDL Ratio: 4.4 RATIO
Triglycerides: 162 mg/dL — ABNORMAL HIGH (ref <150)
VLDL: 32 mg/dL (ref 0–40)

## 2022-09-03 LAB — HEPARIN LEVEL (UNFRACTIONATED): Heparin Unfractionated: <0.1 IU/mL — ABNORMAL LOW (ref 0.30–0.70)

## 2022-09-03 SURGERY — RIGHT/LEFT HEART CATH AND CORONARY ANGIOGRAPHY
Anesthesia: LOCAL

## 2022-09-03 MED ORDER — SODIUM CHLORIDE 0.9% FLUSH: 3.0000 mL | INTRAVENOUS | Status: DC | PRN

## 2022-09-03 MED ORDER — VERAPAMIL HCL 2.5 MG/ML IV SOLN
INTRAVENOUS | Status: AC
  Filled 2022-09-03: qty 2

## 2022-09-03 MED ORDER — IOHEXOL 350 MG/ML SOLN
INTRAVENOUS | Status: DC | PRN
  Administered 2022-09-03: 80 mL

## 2022-09-03 MED ORDER — SODIUM CHLORIDE 0.9% FLUSH: 3.0000 mL | Freq: Two times a day (BID) | INTRAVENOUS | Status: DC

## 2022-09-03 MED ORDER — LIDOCAINE HCL (PF) 1 % IJ SOLN
INTRAMUSCULAR | Status: AC
  Filled 2022-09-03: qty 30

## 2022-09-03 MED ORDER — HEPARIN (PORCINE) IN NACL 1000-0.9 UT/500ML-% IV SOLN
INTRAVENOUS | Status: DC | PRN
  Administered 2022-09-03 (×2): 500 mL

## 2022-09-03 MED ORDER — MIDAZOLAM HCL 2 MG/2ML IJ SOLN
INTRAMUSCULAR | Status: DC | PRN
  Administered 2022-09-03: 1 mg via INTRAVENOUS

## 2022-09-03 MED ORDER — FENTANYL CITRATE (PF) 100 MCG/2ML IJ SOLN
INTRAMUSCULAR | Status: DC | PRN
  Administered 2022-09-03: 25 ug via INTRAVENOUS

## 2022-09-03 MED ORDER — METHYLPREDNISOLONE SODIUM SUCC 125 MG IJ SOLR
INTRAMUSCULAR | Status: AC
  Administered 2022-09-03: 125 mg
  Filled 2022-09-03: qty 2

## 2022-09-03 MED ORDER — SODIUM CHLORIDE 0.9 % IV SOLN: 250.0000 mL | INTRAVENOUS | Status: DC | PRN

## 2022-09-03 MED ORDER — HEPARIN BOLUS VIA INFUSION
2000.0000 [IU] | Freq: Once | INTRAVENOUS | Status: AC
  Administered 2022-09-03: 2000 [IU] via INTRAVENOUS
  Filled 2022-09-03: qty 2000

## 2022-09-03 MED ORDER — SODIUM CHLORIDE 0.9 % IV SOLN: INTRAVENOUS | Status: DC

## 2022-09-03 MED ORDER — ASPIRIN 81 MG PO TBEC
81.0000 mg | DELAYED_RELEASE_TABLET | Freq: Every day | ORAL | Status: DC
  Administered 2022-09-04: 81 mg via ORAL
  Filled 2022-09-03: qty 1

## 2022-09-03 MED ORDER — DIPHENHYDRAMINE HCL 50 MG/ML IJ SOLN: 50.0000 mg | Freq: Once | INTRAMUSCULAR | Status: AC

## 2022-09-03 MED ORDER — SODIUM CHLORIDE 0.9 % IV SOLN: INTRAVENOUS | Status: AC

## 2022-09-03 MED ORDER — ASPIRIN 81 MG PO CHEW
81.0000 mg | CHEWABLE_TABLET | ORAL | Status: AC
  Administered 2022-09-03: 81 mg via ORAL
  Filled 2022-09-03 (×2): qty 1

## 2022-09-03 MED ORDER — LABETALOL HCL 5 MG/ML IV SOLN: 10.0000 mg | INTRAVENOUS | Status: AC | PRN

## 2022-09-03 MED ORDER — ACETAMINOPHEN 325 MG PO TABS: 650.0000 mg | ORAL_TABLET | ORAL | Status: DC | PRN

## 2022-09-03 MED ORDER — HEPARIN SODIUM (PORCINE) 1000 UNIT/ML IJ SOLN
INTRAMUSCULAR | Status: AC
  Filled 2022-09-03: qty 10

## 2022-09-03 MED ORDER — MIDAZOLAM HCL 2 MG/2ML IJ SOLN
INTRAMUSCULAR | Status: AC
  Filled 2022-09-03: qty 2

## 2022-09-03 MED ORDER — DIPHENHYDRAMINE HCL 50 MG/ML IJ SOLN
INTRAMUSCULAR | Status: AC
  Administered 2022-09-03: 50 mg via INTRAVENOUS
  Filled 2022-09-03: qty 1

## 2022-09-03 MED ORDER — SODIUM CHLORIDE 0.9% FLUSH
3.0000 mL | Freq: Two times a day (BID) | INTRAVENOUS | Status: DC
  Administered 2022-09-04: 3 mL via INTRAVENOUS

## 2022-09-03 MED ORDER — DIPHENHYDRAMINE HCL 25 MG PO CAPS: 50.0000 mg | ORAL_CAPSULE | Freq: Once | ORAL | Status: AC

## 2022-09-03 MED ORDER — HYDRALAZINE HCL 20 MG/ML IJ SOLN: 10.0000 mg | INTRAMUSCULAR | Status: AC | PRN

## 2022-09-03 MED ORDER — LIDOCAINE HCL (PF) 1 % IJ SOLN
INTRAMUSCULAR | Status: DC | PRN
  Administered 2022-09-03 (×2): 2 mL via INTRADERMAL

## 2022-09-03 MED ORDER — FENTANYL CITRATE (PF) 100 MCG/2ML IJ SOLN
INTRAMUSCULAR | Status: AC
  Filled 2022-09-03: qty 2

## 2022-09-03 MED ORDER — METHYLPREDNISOLONE SODIUM SUCC 40 MG IJ SOLR
40.0000 mg | INTRAMUSCULAR | Status: DC
  Administered 2022-09-03 (×2): 40 mg via INTRAVENOUS
  Filled 2022-09-03 (×2): qty 1

## 2022-09-03 MED ORDER — ONDANSETRON HCL 4 MG/2ML IJ SOLN: 4.0000 mg | Freq: Four times a day (QID) | INTRAMUSCULAR | Status: DC | PRN

## 2022-09-03 MED ORDER — VERAPAMIL HCL 2.5 MG/ML IV SOLN
INTRAVENOUS | Status: DC | PRN
  Administered 2022-09-03: 10 mL via INTRA_ARTERIAL

## 2022-09-03 MED ORDER — HEPARIN SODIUM (PORCINE) 1000 UNIT/ML IJ SOLN
INTRAMUSCULAR | Status: DC | PRN
  Administered 2022-09-03: 5000 [IU] via INTRAVENOUS

## 2022-09-03 SURGICAL SUPPLY — 15 items
BAND ZEPHYR COMPRESS 30 LONG (HEMOSTASIS) IMPLANT
CATH 5FR JL3.5 JR4 ANG PIG MP (CATHETERS) IMPLANT
CATH BALLN WEDGE 5F 110CM (CATHETERS) IMPLANT
CATH INFINITI 5FR AL1 (CATHETERS) IMPLANT
GLIDESHEATH SLEND SS 6F .021 (SHEATH) IMPLANT
GUIDEWIRE .025 260CM (WIRE) IMPLANT
GUIDEWIRE INQWIRE 1.5J.035X260 (WIRE) IMPLANT
INQWIRE 1.5J .035X260CM (WIRE) ×1
KIT HEART LEFT (KITS) ×1 IMPLANT
MAT PREVALON FULL STRYKER (MISCELLANEOUS) IMPLANT
PACK CARDIAC CATHETERIZATION (CUSTOM PROCEDURE TRAY) ×1 IMPLANT
SHEATH GLIDE SLENDER 4/5FR (SHEATH) IMPLANT
SHEATH PROBE COVER 6X72 (BAG) IMPLANT
TRANSDUCER W/STOPCOCK (MISCELLANEOUS) ×1 IMPLANT
TUBING CIL FLEX 10 FLL-RA (TUBING) ×1 IMPLANT

## 2022-09-03 NOTE — Progress Notes (Signed)
RT went to check on patient to see how she doing on CPAP, patient not on it and back on her o2, not sure when she came off. Pt resting comfortable and no distress noted.

## 2022-09-03 NOTE — Progress Notes (Signed)
Spoke with bed control/patient placement.  Patient is currently on the list for transfer, no beds available at this time.

## 2022-09-03 NOTE — Progress Notes (Signed)
PROGRESS NOTE     Jenna Sherman, is a 72 y.o. female, DOB - 1950/12/28, GEX:[id number]  Admit date - 09/01/2022   Admitting Physician Onnie Boer, MD  Outpatient Primary MD for the patient is [name], [name], MD  LOS - 2  Chief Complaint  Patient presents with   Hematuria        Brief Narrative:  72 year old  WF with past medical history relevant for chronic hypoxic respiratory failure on 2L/min in the setting of chronic diastolic dysfunction CHF, hearing loss, morbid obesity, and HTN admitted from cardiology clinic on 09/01/2022 with exertional chest pain radiating to bilateral shoulders/upper arms as well as chronic DOE. - Evaluated by inpatient cardiology service on 09/02/2022 recommends transfer to [facility]- Patient was picked up by [facility] a.m. of 09/03/2022 for transfer to [facility] for [facility] on 09/03/2022  --Patient was supposed to have a CTA chest PE study as requested by Dr. Vassie Loll from pulmonology--this was not done previously because she has contrast allergy -Since patient has Now been prepped for contrast allergy.... May consider doing a CTA chest prior to discharge from the hospital during this catheterization   -Assessment and Plan: 1)Chest Pains--ruled out for ACS by cardiac enzymes and EKG -VQ scan without PE and lower extremity venous Dopplers negative for DVT -Continue aspirin, IV heparin, metoprolol and atorvastatin -LDL 87, HDL 35, total cholesterol 010 -Evaluated by inpatient cardiology service on 09/02/2022 recommends transfer to [facility] for [facility] scheduled for 09/03/2022 -Patient required steroid and Benadryl prep for contrast allergy  2)Acute on chronic diastolic (congestive) heart failure -POA Admitted with dyspnea on exertion, 1+ pitting bilateral lower extremity edema, chest x-ray showing-enlarged pericardial silhouette, with vascular congestion.  Patient has 10 pound weight gain over the past year.  Troponin 3.  D-dimer elevated at  2.55.  Last echo 09/2021, EF of 70 to 75% -VQ scan without PE (CT not available, also patient has contrast allergy) -Continue IV Lasix -Strict input output, daily weights,   3)Presumed GNR UTI with hematuria---POA -Continue Rocephin for gram-negative rod UTI pending further culture data -Diflucan ordered for yeast  4)Chronic respiratory failure with hypoxia -POA -Chronic diastolic dysfunction CHF -PTA was on 2 L ,currently requiring 3 L per nasal cannula --Patient was supposed to have a CTA chest PE study as requested by Dr. Vassie Loll from pulmonology--this was not done previously because she has contrast allergy -Since patient has Now been prepped for contrast allergy.... May consider doing a CTA chest prior to discharge from the hospital during this catheterization  5)HTN Stable. -Continue p.o. metoprolol and IV Lasix  6)Morbid Obesity- -Low calorie diet, portion control and increase physical activity discussed with patient -Body mass index is 59.9 kg/m.  7)Presumed OSA----VQ scan negative -CPAP nightly -Awaiting outpatient sleep study  Status is: Inpatient   Disposition: The patient is from: Home              Anticipated d/c is to:  [facility] for [facility]              Anticipated d/c date is: 2 days              Patient currently is not medically stable to d/c. Barriers: Not Clinically Stable-   Code Status :  -  Code Status: Full Code   Family Communication:  (patient is alert, awake and coherent)  -Discussed with daughter Jenna Sherman at bedside  DVT Prophylaxis  :   - SCDs /IV heparin  Lab Results  Component Value Date   PLT 132 (L) 09/03/2022   Inpatient Medications  Scheduled Meds:  [MAR Hold] aspirin EC  81 mg Oral Daily   [MAR Hold] atorvastatin  40 mg Oral Daily   diphenhydrAMINE  50 mg Oral Once   Or   diphenhydrAMINE  50 mg Intravenous Once   [MAR Hold] fluconazole  200 mg Oral Daily   [MAR Hold] fluticasone furoate-vilanterol  1 puff Inhalation Daily   [MAR  Hold] gabapentin  600 mg Oral QHS   methylPREDNISolone (SOLU-MEDROL) injection  40 mg Intravenous Q4H   [MAR Hold] metoprolol succinate  50 mg Oral Daily   [MAR Hold] nitroGLYCERIN  0.2 mg Transdermal Daily   [MAR Hold] nystatin   Topical BID   Continuous Infusions:  sodium chloride 10 mL/hr at 09/03/22 0742   [MAR Hold] cefTRIAXone (ROCEPHIN)  IV 2 g (09/02/22 1654)   heparin 1,500 Units/hr (09/03/22 0659)   PRN Meds:.[MAR Hold] acetaminophen **OR** [MAR Hold] acetaminophen, [MAR Hold] clonazePAM, [MAR Hold] nitroGLYCERIN, [MAR Hold] ondansetron **OR** [MAR Hold] ondansetron (ZOFRAN) IV, [MAR Hold] polyethylene glycol   Anti-infectives (From admission, onward)    Start     Dose/Rate Route Frequency Ordered Stop   09/02/22 1700  [MAR Hold]  cefTRIAXone (ROCEPHIN) 2 g in sodium chloride 0.9 % 100 mL IVPB        (MAR Hold since Wed 09/03/2022 at 0934.Hold Reason: Transfer to a Procedural area)   2 g 200 mL/hr over 30 Minutes Intravenous Every 24 hours 09/01/22 2048     09/02/22 1100  [MAR Hold]  fluconazole (DIFLUCAN) tablet 200 mg        (MAR Hold since Wed 09/03/2022 at 0934.Hold Reason: Transfer to a Procedural area)   200 mg Oral Daily 09/02/22 1006 09/05/22 0959   09/01/22 2100  cefTRIAXone (ROCEPHIN) 1 g in sodium chloride 0.9 % 100 mL IVPB        1 g 200 mL/hr over 30 Minutes Intravenous  Once 09/01/22 2048 09/01/22 2327   09/01/22 1945  cefTRIAXone (ROCEPHIN) 1 g in sodium chloride 0.9 % 100 mL IVPB        1 g 200 mL/hr over 30 Minutes Intravenous  Once 09/01/22 1930 09/01/22 2027      Subjective: [name] today has no fevers, no emesis, - -daughter Jenna Sherman at bedside--questions answered -Hematuria appears to be improving.... No further dysuria -Chest discomfort and dyspnea persist --Patient was supposed to have a CTA chest PE study as requested by Dr. Vassie Loll from pulmonology--this was not done previously because she has contrast allergy -Since patient has Now been prepped for  contrast allergy.... May consider doing a CTA chest prior to discharge from the hospital during this catheterization  Objective: Vitals:   09/02/22 2344 09/03/22 0500 09/03/22 0545 09/03/22 0750  BP:   118/60   Pulse: 74  70   Resp:   19   Temp:   98.6 F (37 C)   TempSrc:   Oral   SpO2: 98%  97% 97%  Weight:  121.2 kg    Height:        Intake/Output Summary (Last 24 hours) at 09/03/2022 0947 Last data filed at 09/03/2022 0641 Gross per 24 hour  Intake 828.03 ml  Output 2850 ml  Net -2021.97 ml   Filed Weights   09/01/22 2051 09/02/22 0606 09/03/22 0500  Weight: 123.5 kg 123.1 kg 121.2 kg   Physical Exam Gen:- Awake Alert,  in no apparent distress  HEENT:- [place].AT, No sclera icterus Ears-HOH Nose- [place] 3L/min Neck-Supple Neck,No JVD,.  Lungs-fair air movement, no significant wheezing  CV- S1, S2 normal, regular , 3/6 SM Abd-  +ve B.Sounds, Abd Soft, No tenderness, increased truncal adiposity   , no CVA area tenderness Extremity/Skin:- 1+ve  edema, chronic venous stasis, pedal pulses present  Psych-affect is appropriate, oriented x3 Neuro-generalized weakness, no new focal deficits, no tremors GU--hematuria appears to be improving  Data Reviewed: I have personally reviewed following labs and imaging studies  CBC: Recent Labs  Lab 09/01/22 1332 09/03/22 0427  WBC 5.3 5.2  HGB 12.9 12.9  HCT 38.9 38.0  MCV 97.5 97.7  PLT 121* 132*   Basic Metabolic Panel: Recent Labs  Lab 09/01/22 1332 09/02/22 0435 09/03/22 0427  NA 137 135 135  K 4.4 3.8 3.6  CL 96* 95* 93*  CO2 32 33* 31  GLUCOSE 175* 142* 153*  BUN 24* 20 21  CREATININE 0.78 0.67 0.77  CALCIUM 8.9 8.5* 8.9   GFR: Estimated Creatinine Clearance: 72.6 mL/min (by C-G formula based on SCr of 0.77 mg/dL). Liver Function Tests: Recent Labs  Lab 09/01/22 1332  AST 29  ALT 29  ALKPHOS 62  BILITOT 1.2  PROT 7.7  ALBUMIN 3.2*   Radiology Studies: NM Pulmonary Perfusion  Result Date:  09/02/2022 CLINICAL DATA:  Chest pain, nonspecific. Chronic shortness of breath. EXAM: NUCLEAR MEDICINE PERFUSION LUNG SCAN TECHNIQUE: Perfusion images were obtained in multiple projections after intravenous injection of radiopharmaceutical. Ventilation scans intentionally deferred if perfusion scan and chest x-ray adequate for interpretation during COVID 19 epidemic. RADIOPHARMACEUTICALS:  4.1 mCi Tc-16m MAA IV COMPARISON:  Chest x-ray 09/01/2022 FINDINGS: No segmental or subsegmental perfusion defects to suggest pulmonary embolus. IMPRESSION: No evidence of pulmonary embolus. Electronically Signed   By: Charlett Nose M.D.   On: 09/02/2022 12:45   DG Chest Portable 1 View  Result Date: 09/01/2022 CLINICAL DATA:  Shortness of breath EXAM: PORTABLE CHEST 1 VIEW COMPARISON:  X-ray 09/17/2021 and older FINDINGS: Enlarged cardiopericardial silhouette with vascular congestion. No pneumothorax, effusion or edema. Films are under penetrated. No consolidation. Overlapping cardiac leads. Calcified aorta. IMPRESSION: Enlarged cardiopericardial silhouette with vascular congestion. Electronically Signed   By: Karen Kays M.D.   On: 09/01/2022 17:50   US Venous Img Lower Bilateral  Result Date: 09/01/2022 CLINICAL DATA:  Bilateral lower extremity edema EXAM: BILATERAL LOWER EXTREMITY VENOUS DOPPLER ULTRASOUND TECHNIQUE: Gray-scale sonography with compression, as well as color and duplex ultrasound, were performed to evaluate the deep venous system(s) from the level of the common femoral vein through the popliteal and proximal calf veins. COMPARISON:  None Available. FINDINGS: VENOUS Normal compressibility of the common femoral, superficial femoral, and popliteal veins, as well as the visualized calf veins. Visualized portions of profunda femoral vein and great saphenous vein unremarkable. No filling defects to suggest DVT on grayscale or color Doppler imaging. Doppler waveforms show normal direction of venous flow, normal  respiratory plasticity and response to augmentation. OTHER Subcutaneous soft tissue edema overlying the bilateral calf. Limitations: none IMPRESSION: Negative. Electronically Signed   By: Agustin Cree M.D.   On: 09/01/2022 17:46    Scheduled Meds:  [MAR Hold] aspirin EC  81 mg Oral Daily   [MAR Hold] atorvastatin  40 mg Oral Daily   diphenhydrAMINE  50 mg Oral Once   Or   diphenhydrAMINE  50 mg Intravenous Once   [MAR Hold] fluconazole  200 mg Oral Daily   [MAR Hold] fluticasone furoate-vilanterol  1 puff Inhalation Daily   [MAR Hold] gabapentin  600 mg Oral QHS   methylPREDNISolone (SOLU-MEDROL) injection  40 mg Intravenous Q4H   [MAR Hold] metoprolol succinate  50 mg Oral Daily   [MAR Hold] nitroGLYCERIN  0.2 mg Transdermal Daily   [MAR Hold] nystatin   Topical BID   Continuous Infusions:  sodium chloride 10 mL/hr at 09/03/22 0742   [MAR Hold] cefTRIAXone (ROCEPHIN)  IV 2 g (09/02/22 1654)   heparin 1,500 Units/hr (09/03/22 0659)    LOS: 2 days   Shon Hale M.D on 09/03/2022 at 9:47 AM  Go to [email or web address] - for contact info  [facility] - Office  [phone]  If 7PM-7AM, please contact night-coverage [email or web address] 09/03/2022, 9:47 AM

## 2022-09-03 NOTE — Interval H&P Note (Signed)
Cath Lab Visit (complete for each Cath Lab visit)  Clinical Evaluation Leading to the Procedure:   ACS: Yes.    Non-ACS:    Anginal Classification: CCS IV  Anti-ischemic medical therapy: Minimal Therapy (1 class of medications)  Non-Invasive Test Results: No non-invasive testing performed  Prior CABG: No previous CABG      History and Physical Interval Note:  09/03/2022 1:28 PM  Jenna Sherman  has presented today for surgery, with the diagnosis of chest pain.  The various methods of treatment have been discussed with the patient and family. After consideration of risks, benefits and other options for treatment, the patient has consented to  Procedure(s): RIGHT/LEFT HEART CATH AND CORONARY ANGIOGRAPHY (N/A) as a surgical intervention.  The patient's history has been reviewed, patient examined, no change in status, stable for surgery.  I have reviewed the patient's chart and labs.  Questions were answered to the patient's satisfaction.     Lance Muss

## 2022-09-03 NOTE — Progress Notes (Addendum)
Pt arriving to Holding area with heparin running at 1500 u/hr. Denise chest pain at this moment. This RN will attempt to start a R AC IV. Nitro patch on Lft upper chest

## 2022-09-03 NOTE — Progress Notes (Signed)
  Echocardiogram 2D Echocardiogram has been performed.  Delcie Roch 09/03/2022, 3:55 PM

## 2022-09-03 NOTE — Progress Notes (Signed)
Pt assisted to stretcher with care link. Pt being transferred to [facility] for heart cath. Daughter and spouse at bedside.

## 2022-09-03 NOTE — Progress Notes (Signed)
Rounding Note    Patient Name: Jenna Sherman Date of Encounter: 09/03/2022  [facility] Cardiologist: Dina Rich, MD   Subjective   No acute overnight events. I saw patient in cath lab holding area. She states Nitro patch was placed prior to being placed in the ambulance at [facility] and now she is chest pain free. She still feels like she is having trouble breathing though. She is currently on 3L of supplemental O2 via nasal cannula and is normally only on 1L at home.   Inpatient Medications    Scheduled Meds:  [START ON 09/04/2022] aspirin EC  81 mg Oral Daily   atorvastatin  40 mg Oral Daily   diphenhydrAMINE  50 mg Oral Once   Or   diphenhydrAMINE  50 mg Intravenous Once   fluconazole  200 mg Oral Daily   fluticasone furoate-vilanterol  1 puff Inhalation Daily   furosemide  40 mg Intravenous Q12H   gabapentin  600 mg Oral QHS   methylPREDNISolone (SOLU-MEDROL) injection  40 mg Intravenous Q4H   metoprolol succinate  50 mg Oral Daily   nitroGLYCERIN  0.2 mg Transdermal Daily   nystatin   Topical BID   Continuous Infusions:  sodium chloride 10 mL/hr at 09/03/22 0742   cefTRIAXone (ROCEPHIN)  IV 2 g (09/02/22 1654)   heparin 1,500 Units/hr (09/03/22 0659)   PRN Meds: acetaminophen **OR** acetaminophen, clonazePAM, nitroGLYCERIN, ondansetron **OR** ondansetron (ZOFRAN) IV, polyethylene glycol   Vital Signs    Vitals:   09/02/22 2344 09/03/22 0500 09/03/22 0545 09/03/22 0750  BP:   118/60   Pulse: 74  70   Resp:   19   Temp:   98.6 F (37 C)   TempSrc:   Oral   SpO2: 98%  97% 97%  Weight:  121.2 kg    Height:        Intake/Output Summary (Last 24 hours) at 09/03/2022 0842 Last data filed at 09/03/2022 0641 Gross per 24 hour  Intake 828.03 ml  Output 2850 ml  Net -2021.97 ml      09/03/2022    5:00 AM 09/02/2022    6:06 AM 09/01/2022    8:51 PM  Last 3 Weights  Weight (lbs) 267 lb 3.2 oz 271 lb 6.2 oz 272 lb 4.3 oz  Weight (kg) 121.2 kg 123.1 kg  123.5 kg      Telemetry    NSR, HR in 70's.  - Personally Reviewed  ECG    No new tracings.   Physical Exam   GEN: Morbidly obese Caucasian female in no acute distress.   Neck: JVD difficult to assess due to body habitus. Cardiac: RRR. III/VI systolic murmur. No rubs or gallops. Radial and distal pedal pulses 2= and equal bilaterally.   Respiratory: No increased work of breathing. Decreased breath sounds bilaterally but no wheezes, rhonchi, or rales appreciated.  GI: Soft, non-distended, and non-tender. MS: Trace pedal edema bilaterally. No deformity. Skin: Warm and dry. Neuro:  No focal deficits. Psych: Normal affect. Responds appropriately.  Labs    High Sensitivity Troponin:   Recent Labs  Lab 09/01/22 1517 09/01/22 2020  TROPONINIHS 3 4     Chemistry Recent Labs  Lab 09/01/22 1332 09/02/22 0435 09/03/22 0427  NA 137 135 135  K 4.4 3.8 3.6  CL 96* 95* 93*  CO2 32 33* 31  GLUCOSE 175* 142* 153*  BUN 24* 20 21  CREATININE 0.78 0.67 0.77  CALCIUM 8.9 8.5* 8.9  PROT  7.7  --   --   ALBUMIN 3.2*  --   --   AST 29  --   --   ALT 29  --   --   ALKPHOS 62  --   --   BILITOT 1.2  --   --   GFRNONAA >60 >60 >60  ANIONGAP 9 7 11     Lipids  Recent Labs  Lab 09/03/22 0427  CHOL 154  TRIG 162*  HDL 35*  LDLCALC 87  CHOLHDL 4.4    Hematology Recent Labs  Lab 09/01/22 1332 09/03/22 0427  WBC 5.3 5.2  RBC 3.99 3.89  HGB 12.9 12.9  HCT 38.9 38.0  MCV 97.5 97.7  MCH 32.3 33.2  MCHC 33.2 33.9  RDW 15.9* 15.9*  PLT 121* 132*   Thyroid No results for input(s): "TSH", "FREET4" in the last 168 hours.  BNP Recent Labs  Lab 09/01/22 2020  BNP 140.0*    DDimer  Recent Labs  Lab 09/01/22 1429  DDIMER 2.55*     Radiology    NM Pulmonary Perfusion  Result Date: 09/02/2022 CLINICAL DATA:  Chest pain, nonspecific. Chronic shortness of breath. EXAM: NUCLEAR MEDICINE PERFUSION LUNG SCAN TECHNIQUE: Perfusion images were obtained in multiple projections  after intravenous injection of radiopharmaceutical. Ventilation scans intentionally deferred if perfusion scan and chest x-ray adequate for interpretation during COVID 19 epidemic. RADIOPHARMACEUTICALS:  4.1 mCi Tc-89m MAA IV COMPARISON:  Chest x-ray 09/01/2022 FINDINGS: No segmental or subsegmental perfusion defects to suggest pulmonary embolus. IMPRESSION: No evidence of pulmonary embolus. Electronically Signed   By: Charlett Nose M.D.   On: 09/02/2022 12:45   DG Chest Portable 1 View  Result Date: 09/01/2022 CLINICAL DATA:  Shortness of breath EXAM: PORTABLE CHEST 1 VIEW COMPARISON:  X-ray 09/17/2021 and older FINDINGS: Enlarged cardiopericardial silhouette with vascular congestion. No pneumothorax, effusion or edema. Films are under penetrated. No consolidation. Overlapping cardiac leads. Calcified aorta. IMPRESSION: Enlarged cardiopericardial silhouette with vascular congestion. Electronically Signed   By: Karen Kays M.D.   On: 09/01/2022 17:50   US Venous Img Lower Bilateral  Result Date: 09/01/2022 CLINICAL DATA:  Bilateral lower extremity edema EXAM: BILATERAL LOWER EXTREMITY VENOUS DOPPLER ULTRASOUND TECHNIQUE: Gray-scale sonography with compression, as well as color and duplex ultrasound, were performed to evaluate the deep venous system(s) from the level of the common femoral vein through the popliteal and proximal calf veins. COMPARISON:  None Available. FINDINGS: VENOUS Normal compressibility of the common femoral, superficial femoral, and popliteal veins, as well as the visualized calf veins. Visualized portions of profunda femoral vein and great saphenous vein unremarkable. No filling defects to suggest DVT on grayscale or color Doppler imaging. Doppler waveforms show normal direction of venous flow, normal respiratory plasticity and response to augmentation. OTHER Subcutaneous soft tissue edema overlying the bilateral calf. Limitations: none IMPRESSION: Negative. Electronically Signed   By:  Agustin Cree M.D.   On: 09/01/2022 17:46    Cardiac Studies   Echocardiogram: 09/2021 IMPRESSIONS   1. Left ventricular ejection fraction, by estimation, is 70 to 75%. The  left ventricle has hyperdynamic function. Left ventricular endocardial  border not optimally defined to evaluate regional wall motion. Left  ventricular diastolic parameters are  indeterminate. Elevated left atrial pressure. The average left ventricular  global longitudinal strain is -21.0 %. The global longitudinal strain is  normal.   2. Right ventricular systolic function is normal. The right ventricular  size is normal.   3. The mitral valve is normal in  structure. No evidence of mitral valve  regurgitation. No evidence of mitral stenosis.   4. The aortic valve was not well visualized. There is mild calcification  of the aortic valve. There is mild thickening of the aortic valve. Aortic  valve regurgitation is not visualized. No aortic stenosis is present.   Comparison(s): Echocardiogram done 06/15/13 showed an EF of 60-65%.    Patient Profile     72 y.o. female w/ PMH of HTN, HLD, COPD/chronic hypoxic respiratory failure (on 3L [place] ast baseline), bradycardia, peripheral neuropathy, history of uterine cancer and family history of CAD who is currently admitted with acute HFpEF and chest pain concerning for unstable angina.   Assessment & Plan    Acute HFpEF Presented with worsening dyspnea on exertion and lower extremity edema with BNP mildly elevated at 140 on admission and CXR showed vascular congestion. VQ Scan negative for PE. Last Echo in 09/2021 showed LVEF of 70-75%. She has been receiving IV Lasix 40mg  BID with a recorded net output of -3.1 L thus far. Repeat Echo pending. Will hold additional Lasix for now given her upcoming catheterization for today.  She has not been started on SGLT2 inhibitor given her current UTI.  Chest Pain Concerning for Unstable Angina High-sensitivity troponin values negative x2  this admission. She did report intermittent episodes of chest discomfort for over the past year and there had been prior recommendations for a R/LHC but never pursued. She was evaluated Dr. Jenene Slicker yesterday and it was recommended to pursue a cardiac catheterization for definitive evaluation. This is scheduled for later today. Patient was consented for procedure yesterday. She does have a contrast allergy and she is being pre-medicated with Solu-Medrol and Benadryl.  HTN BP has been stable, at 118/60 on most recent check. Continue Toprol-XL 50mg  daily.   Dyslipidemia Lipid panel this admission: Total Cholesterol 154, Triglycerides 162, HDL 35, LDL 87. Not on a statin at home. Started on Lipitor 40mg  during this admission. If cardiac catheterization shows no CAD, could consider stopping this.   UTI Remains on Rocephin. Management per the admitting team.   Chronic Hypoxic Respiratory Failure She is on 2L [place] which is her baseline.   For questions or updates, please contact [facility] Please consult [email or web address] for contact info under        Signed, Ellsworth Lennox, PA-C  09/03/2022, 8:42 AM

## 2022-09-03 NOTE — Progress Notes (Signed)
ANTICOAGULATION CONSULT NOTE - follow-up  Pharmacy Consult for heparin Indication: chest pain/ACS  Allergies  Allergen Reactions   Ivp Dye [Iodinated Contrast Media] Shortness Of Breath    Patient Measurements: Height: 4\' 8"  (142.2 cm) Weight: 121.2 kg (267 lb 3.2 oz) IBW/kg (Calculated) : 36.3 Heparin Dosing Weight: 70 kg  Vital Signs: Temp: 98.6 F (37 C) (06/05 0545) Temp Source: Oral (06/05 0545) BP: 118/60 (06/05 0545) Pulse Rate: 70 (06/05 0545)  Labs: Recent Labs    09/01/22 1332 09/01/22 1517 09/01/22 2020 09/02/22 0435 09/02/22 2010 09/03/22 0427  HGB 12.9  --   --   --   --  12.9  HCT 38.9  --   --   --   --  38.0  PLT 121*  --   --   --   --  132*  HEPARINUNFRC  --   --   --   --  <0.10* <0.10*  CREATININE 0.78  --   --  0.67  --  0.77  TROPONINIHS  --  3 4  --   --   --      Estimated Creatinine Clearance: 72.6 mL/min (by C-G formula based on SCr of 0.77 mg/dL).   Medical History: Past Medical History:  Diagnosis Date   Asthma    Chronic back pain    Depression    History of uterine cancer    History of vertigo    Hypertension    Normal LVF (EF 65%) by 2D Echo, August 2005. Mild left ventricular hypertrophy; mild mitral regurgitation   Hypertriglyceridemia    Morbid obesity (HCC)    Peripheral neuropathy     Medications:    Assessment: Pharmacy consulted to dose heparin in patient with unstable angina.  Patient is not on anticoagulation prior to admission but received dose of Lovenox 40 mg 6/3 @ 2248.  Plan is for cath.  D-Dimer 2.55, VQ scan was negative for PE.  CT angio pending  Heparin level undetectable on 1200 units/hr. No bleeding or IV issues noted. Patient is scheduled for transfer for cath when bed available.    Goal of Therapy:  Heparin level 0.3-0.7 units/ml Monitor platelets by anticoagulation protocol: Yes   Plan:  Repeat Heparin 2000 unit bolus  Increase heparin infusion to 1500 units/hr Check anti-Xa level in 8  hours and daily while on heparin   Sheppard Coil PharmD., BCPS Clinical Pharmacist 09/03/2022 6:47 AM

## 2022-09-03 NOTE — H&P (View-Only) (Signed)
 Rounding Note    Patient Name: Jenna Sherman Date of Encounter: 09/03/2022  [facility] Cardiologist: Branch, Jonathan, MD   Subjective   No acute overnight events. I saw patient in cath lab holding area. She states Nitro patch was placed prior to being placed in the ambulance at [place] and now she is chest pain free. She still feels like she is having trouble breathing though. She is currently on 3L of supplemental O2 via nasal cannula and is normally only on 1L at home.   Inpatient Medications    Scheduled Meds:  [START ON 09/04/2022] aspirin EC  81 mg Oral Daily   atorvastatin  40 mg Oral Daily   diphenhydrAMINE  50 mg Oral Once   Or   diphenhydrAMINE  50 mg Intravenous Once   fluconazole  200 mg Oral Daily   fluticasone furoate-vilanterol  1 puff Inhalation Daily   furosemide  40 mg Intravenous Q12H   gabapentin  600 mg Oral QHS   methylPREDNISolone (SOLU-MEDROL) injection  40 mg Intravenous Q4H   metoprolol succinate  50 mg Oral Daily   nitroGLYCERIN  0.2 mg Transdermal Daily   nystatin   Topical BID   Continuous Infusions:  sodium chloride 10 mL/hr at 09/03/22 0742   cefTRIAXone (ROCEPHIN)  IV 2 g (09/02/22 1654)   heparin 1,500 Units/hr (09/03/22 0659)   PRN Meds: acetaminophen **OR** acetaminophen, clonazePAM, nitroGLYCERIN, ondansetron **OR** ondansetron (ZOFRAN) IV, polyethylene glycol   Vital Signs    Vitals:   09/02/22 2344 09/03/22 0500 09/03/22 0545 09/03/22 0750  BP:   118/60   Pulse: 74  70   Resp:   19   Temp:   98.6 F (37 C)   TempSrc:   Oral   SpO2: 98%  97% 97%  Weight:  121.2 kg    Height:        Intake/Output Summary (Last 24 hours) at 09/03/2022 0842 Last data filed at 09/03/2022 0641 Gross per 24 hour  Intake 828.03 ml  Output 2850 ml  Net -2021.97 ml      09/03/2022    5:00 AM 09/02/2022    6:06 AM 09/01/2022    8:51 PM  Last 3 Weights  Weight (lbs) 267 lb 3.2 oz 271 lb 6.2 oz 272 lb 4.3 oz  Weight (kg) 121.2 kg 123.1 kg  123.5 kg      Telemetry    NSR, HR in 70's.  - Personally Reviewed  ECG    No new tracings.   Physical Exam   GEN: Morbidly obese Caucasian female in no acute distress.   Neck: JVD difficult to assess due to body habitus. Cardiac: RRR. III/VI systolic murmur. No rubs or gallops. Radial and distal pedal pulses 2= and equal bilaterally.   Respiratory: No increased work of breathing. Decreased breath sounds bilaterally but no wheezes, rhonchi, or rales appreciated.  GI: Soft, non-distended, and non-tender. MS: Trace pedal edema bilaterally. No deformity. Skin: Warm and dry. Neuro:  No focal deficits. Psych: Normal affect. Responds appropriately.  Labs    High Sensitivity Troponin:   Recent Labs  Lab 09/01/22 1517 09/01/22 2020  TROPONINIHS 3 4     Chemistry Recent Labs  Lab 09/01/22 1332 09/02/22 0435 09/03/22 0427  NA 137 135 135  K 4.4 3.8 3.6  CL 96* 95* 93*  CO2 32 33* 31  GLUCOSE 175* 142* 153*  BUN 24* 20 21  CREATININE 0.78 0.67 0.77  CALCIUM 8.9 8.5* 8.9  PROT   7.7  --   --   ALBUMIN 3.2*  --   --   AST 29  --   --   ALT 29  --   --   ALKPHOS 62  --   --   BILITOT 1.2  --   --   GFRNONAA >60 >60 >60  ANIONGAP 9 7 11    Lipids  Recent Labs  Lab 09/03/22 0427  CHOL 154  TRIG 162*  HDL 35*  LDLCALC 87  CHOLHDL 4.4    Hematology Recent Labs  Lab 09/01/22 1332 09/03/22 0427  WBC 5.3 5.2  RBC 3.99 3.89  HGB 12.9 12.9  HCT 38.9 38.0  MCV 97.5 97.7  MCH 32.3 33.2  MCHC 33.2 33.9  RDW 15.9* 15.9*  PLT 121* 132*   Thyroid No results for input(s): "TSH", "FREET4" in the last 168 hours.  BNP Recent Labs  Lab 09/01/22 2020  BNP 140.0*    DDimer  Recent Labs  Lab 09/01/22 1429  DDIMER 2.55*     Radiology    NM Pulmonary Perfusion  Result Date: 09/02/2022 CLINICAL DATA:  Chest pain, nonspecific. Chronic shortness of breath. EXAM: NUCLEAR MEDICINE PERFUSION LUNG SCAN TECHNIQUE: Perfusion images were obtained in multiple projections  after intravenous injection of radiopharmaceutical. Ventilation scans intentionally deferred if perfusion scan and chest x-ray adequate for interpretation during COVID 19 epidemic. RADIOPHARMACEUTICALS:  4.1 mCi Tc-99m MAA IV COMPARISON:  Chest x-ray 09/01/2022 FINDINGS: No segmental or subsegmental perfusion defects to suggest pulmonary embolus. IMPRESSION: No evidence of pulmonary embolus. Electronically Signed   By: Kevin  Dover M.D.   On: 09/02/2022 12:45   DG Chest Portable 1 View  Result Date: 09/01/2022 CLINICAL DATA:  Shortness of breath EXAM: PORTABLE CHEST 1 VIEW COMPARISON:  X-ray 09/17/2021 and older FINDINGS: Enlarged cardiopericardial silhouette with vascular congestion. No pneumothorax, effusion or edema. Films are under penetrated. No consolidation. Overlapping cardiac leads. Calcified aorta. IMPRESSION: Enlarged cardiopericardial silhouette with vascular congestion. Electronically Signed   By: Ashok  Gupta M.D.   On: 09/01/2022 17:50   US Venous Img Lower Bilateral  Result Date: 09/01/2022 CLINICAL DATA:  Bilateral lower extremity edema EXAM: BILATERAL LOWER EXTREMITY VENOUS DOPPLER ULTRASOUND TECHNIQUE: Gray-scale sonography with compression, as well as color and duplex ultrasound, were performed to evaluate the deep venous system(s) from the level of the common femoral vein through the popliteal and proximal calf veins. COMPARISON:  None Available. FINDINGS: VENOUS Normal compressibility of the common femoral, superficial femoral, and popliteal veins, as well as the visualized calf veins. Visualized portions of profunda femoral vein and great saphenous vein unremarkable. No filling defects to suggest DVT on grayscale or color Doppler imaging. Doppler waveforms show normal direction of venous flow, normal respiratory plasticity and response to augmentation. OTHER Subcutaneous soft tissue edema overlying the bilateral calf. Limitations: none IMPRESSION: Negative. Electronically Signed   By:  Limin  Xu M.D.   On: 09/01/2022 17:46    Cardiac Studies   Echocardiogram: 09/2021 IMPRESSIONS   1. Left ventricular ejection fraction, by estimation, is 70 to 75%. The  left ventricle has hyperdynamic function. Left ventricular endocardial  border not optimally defined to evaluate regional wall motion. Left  ventricular diastolic parameters are  indeterminate. Elevated left atrial pressure. The average left ventricular  global longitudinal strain is -21.0 %. The global longitudinal strain is  normal.   2. Right ventricular systolic function is normal. The right ventricular  size is normal.   3. The mitral valve is normal in   structure. No evidence of mitral valve  regurgitation. No evidence of mitral stenosis.   4. The aortic valve was not well visualized. There is mild calcification  of the aortic valve. There is mild thickening of the aortic valve. Aortic  valve regurgitation is not visualized. No aortic stenosis is present.   Comparison(s): Echocardiogram done 06/15/13 showed an EF of 60-65%.    Patient Profile     71 y.o. female w/ PMH of HTN, HLD, COPD/chronic hypoxic respiratory failure (on 3L [place] ast baseline), bradycardia, peripheral neuropathy, history of uterine cancer and family history of CAD who is currently admitted with acute HFpEF and chest pain concerning for unstable angina.   Assessment & Plan    Acute HFpEF Presented with worsening dyspnea on exertion and lower extremity edema with BNP mildly elevated at 140 on admission and CXR showed vascular congestion. VQ Scan negative for PE. Last Echo in 09/2021 showed LVEF of 70-75%. She has been receiving IV Lasix 40mg BID with a recorded net output of -3.1 L thus far. Repeat Echo pending. Will hold additional Lasix for now given her upcoming catheterization for today.  She has not been started on SGLT2 inhibitor given her current UTI.  Chest Pain Concerning for Unstable Angina High-sensitivity troponin values negative x2  this admission. She did report intermittent episodes of chest discomfort for over the past year and there had been prior recommendations for a R/LHC but never pursued. She was evaluated Dr. Mallipeddi yesterday and it was recommended to pursue a cardiac catheterization for definitive evaluation. This is scheduled for later today. Patient was consented for procedure yesterday. She does have a contrast allergy and she is being pre-medicated with Solu-Medrol and Benadryl.  HTN BP has been stable, at 118/60 on most recent check. Continue Toprol-XL 50mg daily.   Dyslipidemia Lipid panel this admission: Total Cholesterol 154, Triglycerides 162, HDL 35, LDL 87. Not on a statin at home. Started on Lipitor 40mg during this admission. If cardiac catheterization shows no CAD, could consider stopping this.   UTI Remains on Rocephin. Management per the admitting team.   Chronic Hypoxic Respiratory Failure She is on 2L [place] which is her baseline.   For questions or updates, please contact [facility] Please consult [email or web address] for contact info under        Signed, Brittany M Strader, PA-C  09/03/2022, 8:42 AM    

## 2022-09-04 ENCOUNTER — Encounter (HOSPITAL_COMMUNITY): Payer: Self-pay | Admitting: Interventional Cardiology

## 2022-09-04 DIAGNOSIS — R079 Chest pain, unspecified: Secondary | ICD-10-CM | POA: Diagnosis not present

## 2022-09-04 DIAGNOSIS — I10 Essential (primary) hypertension: Secondary | ICD-10-CM

## 2022-09-04 DIAGNOSIS — I5033 Acute on chronic diastolic (congestive) heart failure: Secondary | ICD-10-CM | POA: Diagnosis not present

## 2022-09-04 LAB — BASIC METABOLIC PANEL
Anion gap: 11 (ref 5–15)
BUN: 33 mg/dL — ABNORMAL HIGH (ref 8–23)
CO2: 27 mmol/L (ref 22–32)
Calcium: 8.6 mg/dL — ABNORMAL LOW (ref 8.9–10.3)
Chloride: 93 mmol/L — ABNORMAL LOW (ref 98–111)
Creatinine, Ser: 1.07 mg/dL — ABNORMAL HIGH (ref 0.44–1.00)
GFR, Estimated: 56 mL/min — ABNORMAL LOW (ref >60)
Glucose, Bld: 300 mg/dL — ABNORMAL HIGH (ref 70–99)
Potassium: 3.9 mmol/L (ref 3.5–5.1)
Sodium: 131 mmol/L — ABNORMAL LOW (ref 135–145)

## 2022-09-04 LAB — CBC
HCT: 35.7 % — ABNORMAL LOW (ref 36.0–46.0)
Hemoglobin: 12.1 g/dL (ref 12.0–15.0)
MCH: 31.7 pg (ref 26.0–34.0)
MCHC: 33.9 g/dL (ref 30.0–36.0)
MCV: 93.5 fL (ref 80.0–100.0)
Platelets: 120 10*3/uL — ABNORMAL LOW (ref 150–400)
RBC: 3.82 MIL/uL — ABNORMAL LOW (ref 3.87–5.11)
RDW: 15 % (ref 11.5–15.5)
WBC: 8.2 10*3/uL (ref 4.0–10.5)
nRBC: 0 % (ref 0.0–0.2)

## 2022-09-04 LAB — URINE CULTURE: Culture: 2000 — AB

## 2022-09-04 LAB — BRAIN NATRIURETIC PEPTIDE: B Natriuretic Peptide: 70.9 pg/mL (ref 0.0–100.0)

## 2022-09-04 MED ORDER — ORAL CARE MOUTH RINSE: 15.0000 mL | OROMUCOSAL | Status: DC | PRN

## 2022-09-04 MED ORDER — SPIRONOLACTONE 25 MG PO TABS: 25.0000 mg | ORAL_TABLET | Freq: Every day | ORAL | 1 refills | Status: AC

## 2022-09-04 MED ORDER — DICLOFENAC SODIUM 75 MG PO TBEC: 75.0000 mg | DELAYED_RELEASE_TABLET | Freq: Two times a day (BID) | ORAL | Status: AC | PRN

## 2022-09-04 MED ORDER — FUROSEMIDE 40 MG PO TABS: 40.0000 mg | ORAL_TABLET | Freq: Every day | ORAL | 1 refills | Status: AC

## 2022-09-04 MED ORDER — NYSTATIN 100000 UNIT/GM EX POWD: Freq: Two times a day (BID) | CUTANEOUS | 2 refills | Status: AC

## 2022-09-04 MED ORDER — SPIRONOLACTONE 25 MG PO TABS
25.0000 mg | ORAL_TABLET | Freq: Every day | ORAL | Status: DC
  Administered 2022-09-04: 25 mg via ORAL
  Filled 2022-09-04: qty 1

## 2022-09-04 MED ORDER — ASPIRIN 81 MG PO TBEC: 81.0000 mg | DELAYED_RELEASE_TABLET | Freq: Every day | ORAL | 1 refills | Status: DC

## 2022-09-04 MED ORDER — ATORVASTATIN CALCIUM 40 MG PO TABS: 40.0000 mg | ORAL_TABLET | Freq: Every day | ORAL | 0 refills | Status: AC

## 2022-09-04 NOTE — Evaluation (Signed)
Occupational Therapy Evaluation Patient Details Name: Jenna Sherman MRN: [id number] DOB: 06-12-1950 Today's Date: 09/04/2022   History of Present Illness Pt is 72 year old presented to [facility] on  09/01/22 for chest pain. Pt transferred to [facility] on 09/03/22. Pt with acute on chronic chf. 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 PMH - chf, chronic respiratory failure O2 3L, morbid obesity, HTN, asthma, uterine CA, vertigo, TKR   Clinical Impression   PTA, pt lived with husband and daughter who assisted pt with ADL. Upon eval, pt performing UB ADL with set-up and LB ADL with up to max A. Pt transferring to [facility] with RW and min guard A this session. Pt presenting with decr activity tolerance, knowledge of DME/AE, and balance. Will continue to follow acutely and per family request for pt to go home, believe pt would benefit from [facility] to optimize safety and independence.      Recommendations for follow up therapy are one component of a multi-disciplinary discharge planning process, led by the attending physician.  Recommendations may be updated based on patient status, additional functional criteria and insurance authorization.   Assistance Recommended at Discharge Frequent or constant Supervision/Assistance  Patient can return home with the following A little help with walking and/or transfers;A lot of help with bathing/dressing/bathroom;Assistance with cooking/housework;Assist for transportation;Help with stairs or ramp for entrance    Functional Status Assessment  Patient has had a recent decline in their functional status and demonstrates the ability to make significant improvements in function in a reasonable and predictable amount of time.  Equipment Recommendations  Other (comment) (wheelcahir, walker)    Recommendations for Other Services       Precautions / Restrictions Precautions Precautions: Fall Restrictions Weight Bearing Restrictions: Yes RUE Weight  Bearing: Non weight bearing      Mobility Bed Mobility Overal bed mobility: Needs Assistance Bed Mobility: Supine to Sit     Supine to sit: Min assist, HOB elevated     General bed mobility comments: Min A to elevate trunk    Transfers Overall transfer level: Needs assistance Equipment used: Rolling walker (2 wheels) Transfers: Sit to/from Stand, Bed to chair/wheelchair/BSC Sit to Stand: Min guard     Step pivot transfers: Min guard     General transfer comment: Assist for lines/safety.      Balance Overall balance assessment: Needs assistance Sitting-balance support: No upper extremity supported, Feet supported Sitting balance-Leahy Scale: Good     Standing balance support: No upper extremity supported, During functional activity Standing balance-Leahy Scale: Fair                             ADL either performed or assessed with clinical judgement   ADL Overall ADL's : Needs assistance/impaired Eating/Feeding: Modified independent;Sitting   Grooming: Set up;Sitting   Upper Body Bathing: Set up;Sitting   Lower Body Bathing: Maximal assistance   Upper Body Dressing : Set up;Sitting   Lower Body Dressing: Maximal assistance;Sit to/from stand   Toilet Transfer: Min guard;Rolling walker (2 wheels);Stand-pivot   Toileting- Architect and Hygiene: Moderate assistance;Sit to/from stand;Sitting/lateral lean       Functional mobility during ADLs: Min guard;Rolling walker (2 wheels) General ADL Comments: limited by activity tolerance.     Vision   Vision Assessment?: No apparent visual deficits     Perception Perception Perception Tested?: No   Praxis Praxis Praxis tested?: Not tested  Pertinent Vitals/Pain Pain Assessment Pain Assessment: No/denies pain     Hand Dominance     Extremity/Trunk Assessment Upper Extremity Assessment Upper Extremity Assessment: Generalized weakness   Lower Extremity Assessment Lower  Extremity Assessment: Defer to PT evaluation   Cervical / Trunk Assessment Cervical / Trunk Assessment: Other exceptions (large body habitus)   Communication Communication Communication: HOH   Cognition Arousal/Alertness: Awake/alert Behavior During Therapy: WFL for tasks assessed/performed Overall Cognitive Status: Difficult to assess                                 General Comments: Difficulty answering questions. Husband answering many of them.     General Comments  3L O2    Exercises     Shoulder Instructions      Home Living Family/patient expects to be discharged to:: Private residence Living Arrangements: Spouse/significant other Available Help at Discharge: Family;Available 24 hours/day Type of Home: House Home Access: Stairs to enter [employer] of Steps: 1+3   Home Layout: One level               Home Equipment: Shower seat          Prior Functioning/Environment Prior Level of Function : Needs assist       Physical Assist : Mobility (physical) Mobility (physical): Bed mobility;Transfers;Gait   Mobility Comments: Only amb 5' with assist from bed to chair to bsc ADLs Comments: Daughter helps sponge bathe, per husband pt receives assist with all dressing and can groom with set-up        OT Problem List: Decreased strength;Impaired balance (sitting and/or standing);Decreased activity tolerance;Decreased knowledge of use of DME or AE      OT Treatment/Interventions: Self-care/ADL training;Therapeutic exercise;DME and/or AE instruction;Patient/family education;Balance training;Therapeutic activities    OT Goals(Current goals can be found in the care plan section) Acute Rehab OT Goals Patient Stated Goal: go home OT Goal Formulation: With patient Time For Goal Achievement: 09/18/22 Potential to Achieve Goals: Good  OT Frequency: Min 2X/week    Co-evaluation              AM-PAC OT "6 Clicks" Daily Activity      Outcome Measure Help from another person eating meals?: None Help from another person taking care of personal grooming?: A Little Help from another person toileting, which includes using toliet, bedpan, or urinal?: A Lot Help from another person bathing (including washing, rinsing, drying)?: A Lot Help from another person to put on and taking off regular upper body clothing?: A Little Help from another person to put on and taking off regular lower body clothing?: A Lot 6 Click Score: 16   End of Session Equipment Utilized During Treatment: Rolling walker (2 wheels);Other (comment) [facility]) Nurse Communication: Mobility status  Activity Tolerance: Patient tolerated treatment well Patient left: Other (comment) (on potty chair with husband present; needing additional time to have BM. NT aware)  OT Visit Diagnosis: Unsteadiness on feet (R26.81);Muscle weakness (generalized) (M62.81)                Time: 1610-9604 OT Time Calculation (min): 22 min Charges:  OT General Charges $OT Visit: 1 Visit OT Evaluation $OT Eval Moderate Complexity: 1 Mod  Tyler Deis, OTR/L [facility] Acute Rehabilitation Office: [phone]   Myrla Halsted 09/04/2022, 4:55 PM

## 2022-09-04 NOTE — Progress Notes (Signed)
   09/04/22 0038  BiPAP/CPAP/SIPAP  Reason BIPAP/CPAP not in use Non-compliant (Refused)   Patient on 3L [place] and in no distress

## 2022-09-04 NOTE — Evaluation (Signed)
Physical Therapy Evaluation Patient Details Name: Jenna Sherman MRN: [id number] DOB: 1950/07/26 Today's Date: 09/04/2022  History of Present Illness  Pt is 72 year old presented to [facility] on  09/01/22 for chest pain. Pt transferred to [facility] on 09/03/22. Pt with acute on chronic chf. 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 PMH - chf, chronic respiratory failure O2 3L, morbid obesity, HTN, asthma, uterine CA, vertigo, TKR  Clinical Impression  Pt admitted with above diagnosis and presents to PT with functional limitations due to deficits listed below (See PT problem list). Pt needs skilled PT to maximize independence and safety. Pt with very limited mobility in the last two months only walking a few feet. Multiple falls at home. Pt wants to return home with family assist.          Recommendations for follow up therapy are one component of a multi-disciplinary discharge planning process, led by the attending physician.  Recommendations may be updated based on patient status, additional functional criteria and insurance authorization.  Follow Up Recommendations       Assistance Recommended at Discharge Frequent or constant Supervision/Assistance  Patient can return home with the following  A little help with walking and/or transfers;A little help with bathing/dressing/bathroom;Assistance with cooking/housework;Assist for transportation    Equipment Recommendations Rolling walker (2 wheels);Wheelchair (measurements PT);BSC/3in1  Recommendations for Other Services       Functional Status Assessment Patient has had a recent decline in their functional status and/or demonstrates limited ability to make significant improvements in function in a reasonable and predictable amount of time     Precautions / Restrictions Precautions Precautions: Fall      Mobility  Bed Mobility Overal bed mobility: Needs Assistance Bed Mobility: Supine to Sit, Sit to Supine      Supine to sit: Min assist, HOB elevated Sit to supine: Min assist   General bed mobility comments: Assist to elevate trunk and assist to bring legs back up into bed    Transfers Overall transfer level: Needs assistance Equipment used: Rolling walker (2 wheels) Transfers: Sit to/from Stand, Bed to chair/wheelchair/BSC Sit to Stand: Min guard   Step pivot transfers: Min guard       General transfer comment: Assist for lines/safety    Ambulation/Gait Ambulation/Gait assistance: Min assist Gait Distance (Feet): 3 Feet (forward and backward) Assistive device: Rolling walker (2 wheels) Gait Pattern/deviations: Step-through pattern, Decreased step length - right, Decreased step length - left, Wide base of support Gait velocity: decr Gait velocity interpretation: <1.31 ft/sec, indicative of household ambulator   General Gait Details: Assist for balance. Pt with history of legs giving out and falling. Did not push distance with 1 person assist  Stairs            Wheelchair Mobility    Modified Rankin (Stroke Patients Only)       Balance Overall balance assessment: Needs assistance Sitting-balance support: No upper extremity supported, Feet supported Sitting balance-Leahy Scale: Good     Standing balance support: No upper extremity supported, During functional activity Standing balance-Leahy Scale: Fair                               Pertinent Vitals/Pain Pain Assessment Pain Assessment: No/denies pain    Home Living Family/patient expects to be discharged to:: Private residence Living Arrangements: Spouse/significant other Available Help at Discharge: Family;Available 24 hours/day Type of Home: House Home Access:  Stairs to enter   [employer] of Steps: 1+3   Home Layout: One level        Prior Function Prior Level of Function : Needs assist       Physical Assist : Mobility (physical) Mobility (physical): Bed  mobility;Transfers;Gait   Mobility Comments: Only amb 5' with assist from bed to chair to bsc       Hand Dominance        Extremity/Trunk Assessment   Upper Extremity Assessment Upper Extremity Assessment: Generalized weakness    Lower Extremity Assessment Lower Extremity Assessment: Generalized weakness       Communication   Communication: HOH  Cognition Arousal/Alertness: Awake/alert Behavior During Therapy: WFL for tasks assessed/performed Overall Cognitive Status: Difficult to assess                                 General Comments: Difficulty answering questions. Husband answering many of them.        General Comments General comments (skin integrity, edema, etc.): Pt on 3L O2 as at baseline    Exercises     Assessment/Plan    PT Assessment Patient needs continued PT services  PT Problem List Decreased strength;Decreased activity tolerance;Decreased balance;Decreased mobility;Obesity       PT Treatment Interventions DME instruction;Gait training;Functional mobility training;Therapeutic activities;Therapeutic exercise;Balance training;Patient/family education    PT Goals (Current goals can be found in the Care Plan section)  Acute Rehab PT Goals Patient Stated Goal: go home PT Goal Formulation: With patient/family Time For Goal Achievement: 09/18/22 Potential to Achieve Goals: Good    Frequency Min 1X/week     Co-evaluation               AM-PAC PT "6 Clicks" Mobility  Outcome Measure Help needed turning from your back to your side while in a flat bed without using bedrails?: A Little Help needed moving from lying on your back to sitting on the side of a flat bed without using bedrails?: A Little Help needed moving to and from a bed to a chair (including a wheelchair)?: A Little Help needed standing up from a chair using your arms (e.g., wheelchair or bedside chair)?: A Little Help needed to walk in hospital room?: Total Help  needed climbing 3-5 steps with a railing? : Total 6 Click Score: 14    End of Session Equipment Utilized During Treatment: Oxygen Activity Tolerance: Patient limited by fatigue Patient left: in bed;with call bell/phone within reach;with bed alarm set;with family/visitor present Nurse Communication: Mobility status PT Visit Diagnosis: Unsteadiness on feet (R26.81);Other abnormalities of gait and mobility (R26.89);Repeated falls (R29.6);Muscle weakness (generalized) (M62.81)    Time: 1610-9604 PT Time Calculation (min) (ACUTE ONLY): 27 min   Charges:   PT Evaluation $PT Eval Moderate Complexity: 1 Mod PT Treatments $Gait Training: 8-22 mins        [facility] PT Acute Rehabilitation Services Office [phone]   Angelina Ok [facility] 09/04/2022, 11:43 AM

## 2022-09-04 NOTE — TOC Initial Note (Addendum)
Transition of Care [facility]) - Initial/Assessment Note    Patient Details  Name: Jenna Sherman MRN: [id number] Date of Birth: Feb 25, 1951  [facility] CM/SW Contact:    Gala Lewandowsky, RN Phone Number: 09/04/2022, 12:01 PM  Clinical Narrative:  Patient was a transfer from [facility]. PTA patient was from home with spouse. Plan will be to return home. Patient is currently active with [facility] for RN, PT,-added Aide. [facility] is aware that the patient will transition home today. Family wants to transport the patient home via car-offered ambulance and the family declined. DME wheelchair, rolling walker, and bedside commode ordered via [employer]. DME will be delivered to the room prior to transition home. No further home needs identified at this time.                  1313 09-04-22 [employer] was able to secure a bariatric bedside commode for the patient. DME will be delivered to the home due to not being in stock at the hospital. DME wheelchair to be delivered to the room prior to transition home. No further needs identified.  Expected Discharge Plan: Home w Home Health Services Barriers to Discharge: No Barriers Identified   Patient Goals and CMS Choice Patient states their goals for this hospitalization and ongoing recovery are:: to return home.   Choice offered to / list presented to : NA      Expected Discharge Plan and Services In-house Referral: NA Discharge Planning Services: CM Consult Post Acute Care Choice: Home Health, Resumption of Svcs/PTA Provider Living arrangements for the past 2 months: Single Family Home                 DME Arranged: Community education officer wheelchair with seat cushion, Bedside commode, Wheelchair manual DME Agency: [employer] Date DME Agency Contacted: 09/04/22 Time DME Agency Contacted: 1200 Representative spoke with at DME Agency: Mitch HH Arranged: PT, Nurse's Aide, RN HH Agency: [facility] ([facility]) Date HH Agency Contacted:  09/04/22 Time HH Agency Contacted: 1200 Representative spoke with at [facility] Agency: Morrie Sheldon  Prior Living Arrangements/Services Living arrangements for the past 2 months: Single Family Home Lives with:: Spouse, Adult Children Patient language and need for interpreter reviewed:: Yes Do you feel safe going back to the place where you live?: Yes      Need for Family Participation in Patient Care: Yes (Comment) Care giver support system in place?: Yes (comment) Current home services: Home PT, Homehealth aide, Home RN Criminal Activity/Legal Involvement Pertinent to Current Situation/Hospitalization: No - Comment as needed  Activities of Daily Living Home Assistive Devices/Equipment: Nebulizer, Oxygen, Walker (specify type), Shower chair with back, Bedside commode/3-in-1 ADL Screening (condition at time of admission) Patient's cognitive ability adequate to safely complete daily activities?: Yes Is the patient deaf or have difficulty hearing?: Yes Does the patient have difficulty seeing, even when wearing glasses/contacts?: No Does the patient have difficulty concentrating, remembering, or making decisions?: No Patient able to express need for assistance with ADLs?: Yes Does the patient have difficulty dressing or bathing?: Yes Independently performs ADLs?: No Does the patient have difficulty walking or climbing stairs?: Yes Weakness of Legs: Both Weakness of Arms/Hands: None  Permission Sought/Granted Permission sought to share information with : Family Supports, Magazine features editor, Case Estate manager/Delage agent granted to share information with : Yes, Verbal Permission Granted     Permission granted to share info w AGENCY: [facility], [facility].        Emotional Assessment Appearance:: Appears stated age  Attitude/Demeanor/Rapport: Engaged Affect (typically observed): Appropriate Orientation: : Oriented to Self, Oriented to Place Alcohol / Substance Use: Not Applicable Psych  Involvement: No (comment)  Admission diagnosis:  Acute cystitis without hematuria [N30.00] Acute on chronic diastolic (congestive) heart failure (HCC) [I50.33] Dyspnea, unspecified type [R06.00] Patient Active Problem List   Diagnosis Date Noted   Acute on chronic diastolic (congestive) heart failure (HCC) 09/01/2022   UTI (urinary tract infection) 09/01/2022   Chronic respiratory failure with hypoxia (HCC) 08/26/2022   Chronic diastolic heart failure (HCC) 08/26/2022   PURE HYPERCHOLESTEROLEMIA 08/08/2009   PURE HYPERGLYCERIDEMIA 08/08/2009   MORBID OBESITY 08/08/2009   SLEEP APNEA, OBSTRUCTIVE 08/08/2009   Essential hypertension 08/08/2009   ASTHMA, UNSPECIFIED, UNSPECIFIED STATUS 08/08/2009   EDEMA 08/08/2009   Chest pain 08/08/2009   PRECORDIAL PAIN 08/08/2009   PCP:  Estanislado Pandy, MD Pharmacy:   [facility] - [place], [place] - [address] [place] [address] Phone: [phone] Fax: [phone]  [facility] [place], [place] - [address] [place] [address] Phone: [phone] Fax: [phone]     Social Determinants of Health (SDOH) Social History: SDOH Screenings   Food Insecurity: No Food Insecurity (09/01/2022)  Housing: Patient Declined (09/01/2022)  Transportation Needs: Patient Declined (09/01/2022)  Utilities: Not At Risk (09/01/2022)  Tobacco Use: Medium Risk (09/04/2022)   Readmission Risk Interventions     No data to display

## 2022-09-04 NOTE — Care Management (Deleted)
    Durable Medical Equipment  (From admission, onward)           Start     Ordered   09/04/22 1150  For home use only DME Bedside commode  Once       Question Answer Comment  Patient needs a bedside commode to treat with the following condition General weakness   Patient needs a bedside commode to treat with the following condition Congestive heart disease (HCC)      09/04/22 1150   09/04/22 1134  For home use only DME 3 n 1  Once        09/04/22 1133   09/04/22 1132  For home use only DME lightweight manual wheelchair with seat cushion  Once       Comments: Patient suffers from weakness which impairs their ability to perform daily activities like toileting in the home.  A walker will not resolve  issue with performing activities of daily living. A wheelchair will allow patient to safely perform daily activities. Patient is not able to propel themselves in the home using a standard weight wheelchair due to general weakness. Patient can self propel in the lightweight wheelchair. Length of need Lifetime. Accessories: elevating leg rests (ELRs), wheel locks, extensions and anti-tippers.   09/04/22 1133   09/04/22 1131  For home use only DME Walker rolling  Once       Comments: Youth walker pt is 4'8"  Question Answer Comment  Walker: With 5 Inch Wheels   Patient needs a walker to treat with the following condition Abnormal gait      09/04/22 1133

## 2022-09-04 NOTE — Progress Notes (Addendum)
Rounding Note    Patient Name: Jenna Sherman Date of Encounter: 09/04/2022  [facility] Cardiologist: Dina Rich, MD   Subjective   Patient denies chest pain or shortness of breath this morning.   Inpatient Medications    Scheduled Meds:  aspirin EC  81 mg Oral Daily   atorvastatin  40 mg Oral Daily   fluconazole  200 mg Oral Daily   fluticasone furoate-vilanterol  1 puff Inhalation Daily   gabapentin  600 mg Oral QHS   metoprolol succinate  50 mg Oral Daily   nitroGLYCERIN  0.2 mg Transdermal Daily   nystatin   Topical BID   sodium chloride flush  3 mL Intravenous Q12H   Continuous Infusions:  sodium chloride     cefTRIAXone (ROCEPHIN)  IV Stopped (09/03/22 1902)   PRN Meds: sodium chloride, acetaminophen, clonazePAM, nitroGLYCERIN, ondansetron **OR** ondansetron (ZOFRAN) IV, ondansetron (ZOFRAN) IV, mouth rinse, polyethylene glycol, sodium chloride flush   Vital Signs    Vitals:   09/03/22 1500 09/03/22 1950 09/04/22 0050 09/04/22 0543  BP: (!) 137/59 117/79 (!) 120/50 134/67  Pulse: 63 81 70 70  Resp: 20 20 20 17   Temp: 98.3 F (36.8 C) 98.6 F (37 C) 98.4 F (36.9 C) 98.1 F (36.7 C)  TempSrc: Oral Oral Oral Oral  SpO2: 92%  94% 91%  Weight:    121.5 kg  Height:        Intake/Output Summary (Last 24 hours) at 09/04/2022 0825 Last data filed at 09/04/2022 0332 Gross per 24 hour  Intake 278.4 ml  Output 750 ml  Net -471.6 ml      09/04/2022    5:43 AM 09/03/2022    5:00 AM 09/02/2022    6:06 AM  Last 3 Weights  Weight (lbs) 267 lb 13.7 oz 267 lb 3.2 oz 271 lb 6.2 oz  Weight (kg) 121.5 kg 121.2 kg 123.1 kg      Telemetry    Sinus rhythm - Personally Reviewed  ECG    No new tracing - Personally Reviewed  Physical Exam   GEN: No acute distress.   Neck: Unable to assess JVP due to body habitus Cardiac: RRR,  Grade III systolic murmur. No rubs, or gallops.  Respiratory: Clear to auscultation bilaterally. GI: Soft, nontender,  non-distended  MS: No edema; No deformity. Neuro:  Nonfocal  Psych: Normal affect   Labs    High Sensitivity Troponin:   Recent Labs  Lab 09/01/22 1517 09/01/22 2020  TROPONINIHS 3 4     Chemistry Recent Labs  Lab 09/01/22 1332 09/02/22 0435 09/03/22 0427 09/03/22 1339 09/03/22 1350 09/04/22 0504  NA 137 135 135 135 136  136 131*  K 4.4 3.8 3.6 4.2 4.2  4.2 3.9  CL 96* 95* 93*  --   --  93*  CO2 32 33* 31  --   --  27  GLUCOSE 175* 142* 153*  --   --  300*  BUN 24* 20 21  --   --  33*  CREATININE 0.78 0.67 0.77  --   --  1.07*  CALCIUM 8.9 8.5* 8.9  --   --  8.6*  PROT 7.7  --   --   --   --   --   ALBUMIN 3.2*  --   --   --   --   --   AST 29  --   --   --   --   --  ALT 29  --   --   --   --   --   ALKPHOS 62  --   --   --   --   --   BILITOT 1.2  --   --   --   --   --   GFRNONAA >60 >60 >60  --   --  56*  ANIONGAP 9 7 11   --   --  11    Lipids  Recent Labs  Lab 09/03/22 0427  CHOL 154  TRIG 162*  HDL 35*  LDLCALC 87  CHOLHDL 4.4    Hematology Recent Labs  Lab 09/01/22 1332 09/03/22 0427 09/03/22 1339 09/03/22 1350 09/04/22 0504  WBC 5.3 5.2  --   --  8.2  RBC 3.99 3.89  --   --  3.82*  HGB 12.9 12.9 13.3 13.6  13.6 12.1  HCT 38.9 38.0 39.0 40.0  40.0 35.7*  MCV 97.5 97.7  --   --  93.5  MCH 32.3 33.2  --   --  31.7  MCHC 33.2 33.9  --   --  33.9  RDW 15.9* 15.9*  --   --  15.0  PLT 121* 132*  --   --  120*   Thyroid No results for input(s): "TSH", "FREET4" in the last 168 hours.  BNP Recent Labs  Lab 09/01/22 2020  BNP 140.0*    DDimer  Recent Labs  Lab 09/01/22 1429  DDIMER 2.55*     Radiology    ECHOCARDIOGRAM LIMITED  Result Date: 09/03/2022    ECHOCARDIOGRAM LIMITED REPORT   Patient Name:   Jenna Sherman Date of Exam: 09/03/2022 Medical Rec #:  [id number]    Height:       56.0 in Accession #:    [id number]   Weight:       267.2 lb Date of Birth:  08-Nov-1950    BSA:          2.008 m Patient Age:    71 years     BP:            146/70 mmHg Patient Gender: F            HR:           62 bpm. Exam Location:  Inpatient Procedure: Limited Echo, Limited Color Doppler and Cardiac Doppler Indications:    acute diastolic chf  History:        Patient has prior history of Echocardiogram examinations, most                 recent 10/21/2021. Signs/Symptoms:Chest Pain and Edema; Risk                 Factors:Sleep Apnea, Hypertension and Dyslipidemia.  Sonographer:    Delcie Roch RDCS Referring Phys: [id number] Ellsworth Lennox  Sonographer Comments: Patient is obese. Image acquisition challenging due to patient body habitus. IMPRESSIONS  1. Left ventricular ejection fraction, by estimation, is 60 to 65%. The left ventricle has normal function. The left ventricle has no regional wall motion abnormalities. The left ventricular internal cavity size was mildly dilated. Left ventricular diastolic parameters are consistent with Grade I diastolic dysfunction (impaired relaxation).  2. Right ventricular systolic function is normal. The right ventricular size is normal. There is normal pulmonary artery systolic pressure. The estimated right ventricular systolic pressure is 24.3 mmHg.  3. The mitral valve is normal in structure. No evidence of mitral valve regurgitation. No evidence  of mitral stenosis.  4. The aortic valve was not well visualized. Aortic valve regurgitation is not visualized. No aortic stenosis is present.  5. The inferior vena cava is normal in size with greater than 50% respiratory variability, suggesting right atrial pressure of 3 mmHg. FINDINGS  Left Ventricle: Left ventricular ejection fraction, by estimation, is 60 to 65%. The left ventricle has normal function. The left ventricle has no regional wall motion abnormalities. The left ventricular internal cavity size was mildly dilated. There is  no left ventricular hypertrophy. Left ventricular diastolic parameters are consistent with Grade I diastolic dysfunction (impaired relaxation).  Normal left ventricular filling pressure. Right Ventricle: The right ventricular size is normal. No increase in right ventricular wall thickness. Right ventricular systolic function is normal. There is normal pulmonary artery systolic pressure. The tricuspid regurgitant velocity is 2.31 m/s, and  with an assumed right atrial pressure of 3 mmHg, the estimated right ventricular systolic pressure is 24.3 mmHg. Left Atrium: Left atrial size was normal in size. Right Atrium: Right atrial size was normal in size. Pericardium: There is no evidence of pericardial effusion. Mitral Valve: The mitral valve is normal in structure. No evidence of mitral valve stenosis. Tricuspid Valve: The tricuspid valve is normal in structure. Tricuspid valve regurgitation is trivial. No evidence of tricuspid stenosis. Aortic Valve: The aortic valve was not well visualized. Aortic valve regurgitation is not visualized. No aortic stenosis is present. Pulmonic Valve: The pulmonic valve was normal in structure. Pulmonic valve regurgitation is not visualized. No evidence of pulmonic stenosis. Aorta: The aortic root is normal in size and structure. Venous: The inferior vena cava is normal in size with greater than 50% respiratory variability, suggesting right atrial pressure of 3 mmHg. IAS/Shunts: No atrial level shunt detected by color flow Doppler. Additional Comments: Spectral Doppler performed. Color Doppler performed.  LEFT VENTRICLE PLAX 2D LVIDd:         5.90 cm   Diastology LVIDs:         4.20 cm   LV e' medial:    7.18 cm/s LV PW:         1.00 cm   LV E/e' medial:  10.6 LV IVS:        0.90 cm   LV e' lateral:   4.57 cm/s LVOT diam:     2.10 cm   LV E/e' lateral: 16.6 LVOT Area:     3.46 cm  IVC IVC diam: 1.70 cm LEFT ATRIUM         Index LA diam:    4.20 cm 2.09 cm/m   AORTA Ao Root diam: 3.40 cm Ao Asc diam:  3.40 cm MITRAL VALVE                TRICUSPID VALVE MV Area (PHT): 2.45 cm     TR Peak grad:   21.3 mmHg MV Decel Time: 310 msec      TR Vmax:        231.00 cm/s MV E velocity: 75.80 cm/s MV A velocity: 112.00 cm/s  SHUNTS MV E/A ratio:  0.68         Systemic Diam: 2.10 cm Armanda Magic MD Electronically signed by Armanda Magic MD Signature Date/Time: 09/03/2022/4:03:05 PM    Final    CARDIAC CATHETERIZATION  Result Date: 09/03/2022   LV end diastolic pressure is normal.  LVEDP 12 mm Hg.   There is no aortic valve stenosis.   Recommend Aspirin 81mg  daily for moderate CAD.   Mild,  calcific nonobstructive CAD.   Aortic saturation 94%, PA saturation 68%, mean RA pressure 5 mmHg, PA pressure 32/15, mean PA pressure 24 mmHg, cardiac output 5.89 L/min, cardiac index 2.92. Continue preventive therapy. Tortuosity in the right subclavian and rotation of the heart make catheter engagement difficult.  AL-1 catheter needed to engage the RCA.  If repeat catheterization was needed in the future, would consider right femoral approach.   NM Pulmonary Perfusion  Result Date: 09/02/2022 CLINICAL DATA:  Chest pain, nonspecific. Chronic shortness of breath. EXAM: NUCLEAR MEDICINE PERFUSION LUNG SCAN TECHNIQUE: Perfusion images were obtained in multiple projections after intravenous injection of radiopharmaceutical. Ventilation scans intentionally deferred if perfusion scan and chest x-ray adequate for interpretation during COVID 19 epidemic. RADIOPHARMACEUTICALS:  4.1 mCi Tc-30m MAA IV COMPARISON:  Chest x-ray 09/01/2022 FINDINGS: No segmental or subsegmental perfusion defects to suggest pulmonary embolus. IMPRESSION: No evidence of pulmonary embolus. Electronically Signed   By: Charlett Nose M.D.   On: 09/02/2022 12:45    Cardiac Studies   09/03/22 RHC/LHC    LV end diastolic pressure is normal.  LVEDP 12 mm Hg.   There is no aortic valve stenosis.   Recommend Aspirin 81mg  daily for moderate CAD.   Mild, calcific nonobstructive CAD.   Aortic saturation 94%, PA saturation 68%, mean RA pressure 5 mmHg, PA pressure 32/15, mean PA pressure 24 mmHg,  cardiac output 5.89 L/min, cardiac index 2.92.   Continue preventive therapy.    Tortuosity in the right subclavian and rotation of the heart make catheter engagement difficult.  AL-1 catheter needed to engage the RCA.  If repeat catheterization was needed in the future, would consider right femoral approach.  09/03/22 Limited TTE  IMPRESSIONS     1. Left ventricular ejection fraction, by estimation, is 60 to 65%. The  left ventricle has normal function. The left ventricle has no regional  wall motion abnormalities. The left ventricular internal cavity size was  mildly dilated. Left ventricular  diastolic parameters are consistent with Grade I diastolic dysfunction  (impaired relaxation).   2. Right ventricular systolic function is normal. The right ventricular  size is normal. There is normal pulmonary artery systolic pressure. The  estimated right ventricular systolic pressure is 24.3 mmHg.   3. The mitral valve is normal in structure. No evidence of mitral valve  regurgitation. No evidence of mitral stenosis.   4. The aortic valve was not well visualized. Aortic valve regurgitation  is not visualized. No aortic stenosis is present.   5. The inferior vena cava is normal in size with greater than 50%  respiratory variability, suggesting right atrial pressure of 3 mmHg.   FINDINGS   Left Ventricle: Left ventricular ejection fraction, by estimation, is 60  to 65%. The left ventricle has normal function. The left ventricle has no  regional wall motion abnormalities. The left ventricular internal cavity  size was mildly dilated. There is   no left ventricular hypertrophy. Left ventricular diastolic parameters  are consistent with Grade I diastolic dysfunction (impaired relaxation).  Normal left ventricular filling pressure.   Right Ventricle: The right ventricular size is normal. No increase in  right ventricular wall thickness. Right ventricular systolic function is  normal. There  is normal pulmonary artery systolic pressure. The tricuspid  regurgitant velocity is 2.31 m/s, and   with an assumed right atrial pressure of 3 mmHg, the estimated right  ventricular systolic pressure is 24.3 mmHg.   Left Atrium: Left atrial size was normal in size.  Right Atrium: Right atrial size was normal in size.   Pericardium: There is no evidence of pericardial effusion.   Mitral Valve: The mitral valve is normal in structure. No evidence of  mitral valve stenosis.   Tricuspid Valve: The tricuspid valve is normal in structure. Tricuspid  valve regurgitation is trivial. No evidence of tricuspid stenosis.   Aortic Valve: The aortic valve was not well visualized. Aortic valve  regurgitation is not visualized. No aortic stenosis is present.   Pulmonic Valve: The pulmonic valve was normal in structure. Pulmonic valve  regurgitation is not visualized. No evidence of pulmonic stenosis.   Aorta: The aortic root is normal in size and structure.   Venous: The inferior vena cava is normal in size with greater than 50%  respiratory variability, suggesting right atrial pressure of 3 mmHg.   IAS/Shunts: No atrial level shunt detected by color flow Doppler.   Additional Comments: Spectral Doppler performed. Color Doppler performed.    Patient Profile     Jenna Sherman is a 72 y.o. female with a hx of hyperlipidemia, hypertension, family history of CVD, history of chest pain, COPD, morbid obesity, bradycardia, peripheral neuropathy, history of uterine cancer who was first seen by [facility] 09/02/2022 for the evaluation of chest pain and decompensated congestive heart failure at the request of Dr Mariea Clonts. She presented to  [facility] emergency department on 09/01/2022 for the evaluation of hematuria. Noted to be hypoxic with evidence of hypervolemia.    Assessment & Plan    Chest pain, unstable angina  Patient with recent chest pain concerning for unstable angina (has also reported chronic chest  pain over the last year). Negative HS troponin x2 this admission. LHC on 6/5 with mild-moderate non-obstructive CAD.  Continue preventative therapies, ASA 81mg , statin with LDL goal <70. Continue Toprol XL 50mg  daily.  Could consider long acting nitrate for recurrent chest pain.  Acute HF with preserved EF  Patient noted with worsening dyspnea and lower extremity edema on admission to [facility]. BNP elevated to 140 and CXR indicated vascular congestion. LVEF previously normal in 2023. This admission, limited TTE with LVEF 60-65% and grade I diastolic dysfunction. Patient received IV lasix 40mg  x4 doses. LHC/RHC with LVEDP , aortic saturation 94%, PA saturation 68%, mean RA pressure 5 mmHg, PA pressure 32/15, mean PA pressure 24 mmHg, cardiac output 5.89 L/min, cardiac index 2.92.   Patient near euvolemic per LHC/RHC on 6/5 and no obvious evidence of hypervolemia on physical exam. Volume assessment is challenging. Will check BNP this morning but suspect patient will not require additional IV lasix. Will add Spironolactone per HFpEF GDMT guidelines. Would avoid SGLT2 at this time given active UTI. Long-term use likely not ideal either with elevated BMI.   Hypertension  BP slightly elevated this admission. Continue Toprol XL 50mg  and add Spironolactone as above.  Hyperlipidemia  Continue Lipitor 40mg  as started this admission. LDL goal <70 with mild-moderate CAD.   Per primary team: UTI Chronic hypoxic respiratory failure      For questions or updates, please contact [facility] Please consult [email or web address] for contact info under        Signed, Perlie Gold, PA-C  09/04/2022, 8:25 AM    Patient seen and examined and agree with Perlie Gold PA-C as detailed above.  In brief, the patient is a 72 year old female with history of HTN, HLD, COPD, and morbid obesity who presented with acute HFpEF exacerbation and chest pain for which Cardiology was consulted.  LHC  yesterday with mild to moderate nonobstructive CAD. RHC with mean RA pressure 5 mmHg, PA pressure 32/15, mean PA pressure 24 mmHg, cardiac output 5.89 L/min, cardiac index 2.92. LVEDP . TTE with LVEF 60-65%, G1DD, normal RV, no significant valve disease.  Overall, patient is significantly improved from a volume standpoint. Agree with initiation of spironolactone for HFpEF. No SGLT2i due to UTI. Resume home lasix 40mg  daily tomorrow pending renal function. No further CV work-up needed at this time.  GEN: No acute distress. comfortable   Neck: Unable to assess due to body habitus Cardiac: RRR, no murmurs, rubs, or gallops.  Respiratory: Clear to auscultation bilaterally. GI: Soft, nontender, non-distended  MS: Trace edema, warm Neuro:  Nonfocal  Psych: Normal affect    Plan: -LHC without obstructive disease; RHC with normal filling pressures -Agree with starting spiro 25mg  daily for HFpEF -No SGLT2i due to UTI -Likely resume maintenance lasix 40mg  PO daily tomorrow pending renal function -No further CV work-up needed at this time  Cardiology will sign off. Will arrange outpatient follow-up.  Laurance Flatten, MD

## 2022-09-04 NOTE — Discharge Summary (Signed)
Physician Discharge Summary  Jenna Sherman ZOX:[id number] DOB: 08-17-1950 DOA: 09/01/2022  PCP: Estanislado Pandy, MD  Admit date: 09/01/2022 Discharge date: 09/04/2022  Admitted From: Home Disposition:  Home  Discharge Condition:Stable CODE STATUS:FULL Diet recommendation: Heart Healthy Brief/Interim Summary: Patient is a 72 year old female with past medical history relevant for chronic hypoxic respiratory failure on 2L/min in the setting of chronic diastolic dysfunction CHF, hearing loss, morbid obesity, and HTN admitted from cardiology clinic on 09/01/2022 with exertional chest pain radiating to bilateral shoulders/upper arms as well as chronic DOE.  Patient was admitted at [facility].  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  Her chest pain has resolved.  She remains on her baseline oxygen requirement.  Cardiology cleared for discharge.  Medically stable for discharge to home today.  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  Cardiology signed off.  She is chest pain free right now.   2)Acute on chronic diastolic (congestive) heart failure -POA Admitted with dyspnea on exertion, 1+ pitting bilateral lower extremity edema, chest x-ray showing-enlarged pericardial silhouette, with vascular congestion.  Patient has 10 pound weight gain over the past year.  Troponin 3.  D-dimer elevated at 2.55.  Last echo 09/2021, EF of 70 to 75% -VQ scan without PE (CT not available, also patient has contrast allergy) -Initially given IV Lasix,.  She can start on her oral Lasix  from tomorrow -Appears overall euvolemic today.   3)Presumed GNR UTI with hematuria---POA/panniculitis -Urine culture showed 2000 colonies of E. coli.  She got 3 days of ceftriaxone.  No dysuria. -Diflucan ordered for panniculitis  Continue nystatin at home   4)Chronic respiratory failure with hypoxia -POA -Chronic diastolic dysfunction CHF -PTA was on 2 L ,currently on same -Patient was supposed to have a CTA chest PE study as requested by Dr. Vassie Loll from pulmonology--this was not done previously because she has contrast allergy -VQ scan done here negative for PE  5)HTN Stable. -Continue metoprolol   6)Morbid Obesity- -Low calorie diet, portion control and increase physical activity discussed with patient -Body mass index is 59.9 kg/m.   7)Presumed OSA -CPAP nightly -Awaiting outpatient sleep study  Discharge Diagnoses:  Principal Problem:   Acute on chronic diastolic (congestive) heart failure (HCC) Active Problems:   Chest pain   UTI (urinary tract infection)   MORBID OBESITY   SLEEP APNEA, OBSTRUCTIVE   Essential hypertension   Chronic respiratory failure with hypoxia [facility])    Discharge Instructions  Discharge Instructions     Diet - low sodium heart healthy   Complete by: As directed    Discharge instructions   Complete by: As directed    1)Please take prescribed medications as instructed 2)Follow up with your PCP in a week.Do liver function test and lipid panel in 6-8 weeks 3)Follow up with home health   Increase activity slowly   Complete by: As directed    No wound care   Complete by: As directed       Allergies as of 09/04/2022       Reactions   Ivp Dye [iodinated Contrast Media] Shortness Of Breath  Medication List     TAKE these medications    albuterol (2.5 MG/3ML) 0.083% nebulizer solution Commonly known as: PROVENTIL Take 2.5 mg by nebulization every 4 (four) hours as needed for wheezing or shortness of breath.   aspirin EC 81  MG tablet Take 1 tablet (81 mg total) by mouth daily. Swallow whole. Start taking on: September 05, 2022   atorvastatin 40 MG tablet Commonly known as: LIPITOR Take 1 tablet (40 mg total) by mouth daily. Start taking on: September 05, 2022   Breo Ellipta 200-25 MCG/ACT Aepb Generic drug: fluticasone furoate-vilanterol Inhale 1 puff into the lungs daily.   cetirizine 10 MG tablet Commonly known as: ZYRTEC Take 1 tablet by mouth daily.   clonazePAM 0.5 MG tablet Commonly known as: KLONOPIN Take 0.5 mg by mouth every 8 (eight) hours as needed for anxiety.   cyclobenzaprine 10 MG tablet Commonly known as: FLEXERIL Take 15 mg by mouth at bedtime.   diclofenac 75 MG EC tablet Commonly known as: VOLTAREN Take 1 tablet (75 mg total) by mouth every 12 (twelve) hours as needed. What changed:  when to take this reasons to take this   escitalopram 20 MG tablet Commonly known as: LEXAPRO Take 20 mg by mouth at bedtime.   furosemide 40 MG tablet Commonly known as: LASIX Take 1 tablet (40 mg total) by mouth daily. Start taking from tomorrow What changed: additional instructions   gabapentin 300 MG capsule Commonly known as: NEURONTIN Take 600 mg by mouth at bedtime.   meclizine 25 MG tablet Commonly known as: ANTIVERT Take 25 mg by mouth 3 (three) times daily as needed for dizziness.   metoprolol succinate 50 MG 24 hr tablet Commonly known as: TOPROL-XL Take 50 mg by mouth daily.   nitroGLYCERIN 0.4 MG SL tablet Commonly known as: NITROSTAT Place 1 tablet (0.4 mg total) under the tongue every 5 (five) minutes x 3 doses as needed for chest pain (if no relief after 3rd dose, proceed to ED for evalution or call 911).   Nyamyc powder Generic drug: nystatin SMARTSIG:5 Gram(s) Topical 4 Times Daily What changed: Another medication with the same name was added. Make sure you understand how and when to take each.   nystatin powder Commonly known as: MYCOSTATIN/NYSTOP Apply topically 2  (two) times daily. Apply on abdominal folds What changed: You were already taking a medication with the same name, and this prescription was added. Make sure you understand how and when to take each.   spironolactone 25 MG tablet Commonly known as: ALDACTONE Take 1 tablet (25 mg total) by mouth daily. Start taking on: September 05, 2022               Durable Medical Equipment  (From admission, onward)           Start     Ordered   09/04/22 1152  For home use only DME Bedside commode  Once       Comments: Confined to one room  Question Answer Comment  Patient needs a bedside commode to treat with the following condition General weakness   Patient needs a bedside commode to treat with the following condition Congestive heart disease (HCC)      09/04/22 1151   09/04/22 1134  For home use only DME 3 n 1  Once        09/04/22 1133   09/04/22 1132  For home use only DME lightweight manual wheelchair with seat cushion  Once  Comments: Patient suffers from weakness which impairs their ability to perform daily activities like toileting in the home.  A walker will not resolve  issue with performing activities of daily living. A wheelchair will allow patient to safely perform daily activities. Patient is not able to propel themselves in the home using a standard weight wheelchair due to general weakness. Patient can self propel in the lightweight wheelchair. Length of need Lifetime. Accessories: elevating leg rests (ELRs), wheel locks, extensions and anti-tippers.   09/04/22 1133   09/04/22 1131  For home use only DME Walker rolling  Once       Comments: Youth walker pt is 4'8"  Question Answer Comment  Walker: With 5 Inch Wheels   Patient needs a walker to treat with the following condition Abnormal gait      09/04/22 1133            Follow-up Information     [name], [name], MD. Schedule an appointment as soon as possible for a visit in 1 week(s).   Specialty: Family  Medicine Contact information: [address] [place] [address] [phone]         [facility] Follow up.   Why: Wheelchair, Bedside commode, rolling walker-office to deliver to the room. Contact information: [address] [place] [address] [phone]         [facility] Follow up.   Why: Registered Nurse, Physical Therapy, Aide. office to call with visit times. Contact information: [address] [place] [address] [phone]                Allergies  Allergen Reactions   Ivp Dye [Iodinated Contrast Media] Shortness Of Breath    Consultations: cardiology   Procedures/Studies: ECHOCARDIOGRAM LIMITED  Result Date: 09/03/2022    ECHOCARDIOGRAM LIMITED REPORT   Patient Name:   Jenna Sherman Date of Exam: 09/03/2022 Medical Rec #:  [id number]    Height:       56.0 in Accession #:    [id number]   Weight:       267.2 lb Date of Birth:  15-Jan-1951    BSA:          2.008 m Patient Age:    71 years     BP:           146/70 mmHg Patient Gender: F            HR:           62 bpm. Exam Location:  Inpatient Procedure: Limited Echo, Limited Color Doppler and Cardiac Doppler Indications:    acute diastolic chf  History:        Patient has prior history of Echocardiogram examinations, most                 recent 10/21/2021. Signs/Symptoms:Chest Pain and Edema; Risk                 Factors:Sleep Apnea, Hypertension and Dyslipidemia.  Sonographer:    Delcie Roch RDCS Referring Phys: [id number] Ellsworth Lennox  Sonographer Comments: Patient is obese. Image acquisition challenging due to patient body habitus. IMPRESSIONS  1. Left ventricular ejection fraction, by estimation, is 60 to 65%. The left ventricle has normal function. The left ventricle has no regional wall motion abnormalities. The left ventricular internal cavity size was mildly dilated. Left ventricular diastolic parameters are consistent with Grade I diastolic dysfunction  (impaired relaxation).  2. Right  ventricular systolic function is normal. The right ventricular size is normal. There is normal pulmonary artery systolic pressure. The estimated right ventricular systolic pressure is 24.3 mmHg.  3. The mitral valve is normal in structure. No evidence of mitral valve regurgitation. No evidence of mitral stenosis.  4. The aortic valve was not well visualized. Aortic valve regurgitation is not visualized. No aortic stenosis is present.  5. The inferior vena cava is normal in size with greater than 50% respiratory variability, suggesting right atrial pressure of 3 mmHg. FINDINGS  Left Ventricle: Left ventricular ejection fraction, by estimation, is 60 to 65%. The left ventricle has normal function. The left ventricle has no regional wall motion abnormalities. The left ventricular internal cavity size was mildly dilated. There is  no left ventricular hypertrophy. Left ventricular diastolic parameters are consistent with Grade I diastolic dysfunction (impaired relaxation). Normal left ventricular filling pressure. Right Ventricle: The right ventricular size is normal. No increase in right ventricular wall thickness. Right ventricular systolic function is normal. There is normal pulmonary artery systolic pressure. The tricuspid regurgitant velocity is 2.31 m/s, and  with an assumed right atrial pressure of 3 mmHg, the estimated right ventricular systolic pressure is 24.3 mmHg. Left Atrium: Left atrial size was normal in size. Right Atrium: Right atrial size was normal in size. Pericardium: There is no evidence of pericardial effusion. Mitral Valve: The mitral valve is normal in structure. No evidence of mitral valve stenosis. Tricuspid Valve: The tricuspid valve is normal in structure. Tricuspid valve regurgitation is trivial. No evidence of tricuspid stenosis. Aortic Valve: The aortic valve was not well visualized. Aortic valve regurgitation is not visualized. No aortic stenosis is  present. Pulmonic Valve: The pulmonic valve was normal in structure. Pulmonic valve regurgitation is not visualized. No evidence of pulmonic stenosis. Aorta: The aortic root is normal in size and structure. Venous: The inferior vena cava is normal in size with greater than 50% respiratory variability, suggesting right atrial pressure of 3 mmHg. IAS/Shunts: No atrial level shunt detected by color flow Doppler. Additional Comments: Spectral Doppler performed. Color Doppler performed.  LEFT VENTRICLE PLAX 2D LVIDd:         5.90 cm   Diastology LVIDs:         4.20 cm   LV e' medial:    7.18 cm/s LV PW:         1.00 cm   LV E/e' medial:  10.6 LV IVS:        0.90 cm   LV e' lateral:   4.57 cm/s LVOT diam:     2.10 cm   LV E/e' lateral: 16.6 LVOT Area:     3.46 cm  IVC IVC diam: 1.70 cm LEFT ATRIUM         Index LA diam:    4.20 cm 2.09 cm/m   AORTA Ao Root diam: 3.40 cm Ao Asc diam:  3.40 cm MITRAL VALVE                TRICUSPID VALVE MV Area (PHT): 2.45 cm     TR Peak grad:   21.3 mmHg MV Decel Time: 310 msec     TR Vmax:        231.00 cm/s MV E velocity: 75.80 cm/s MV A velocity: 112.00 cm/s  SHUNTS MV E/A ratio:  0.68         Systemic Diam: 2.10 cm Armanda Magic MD Electronically signed by Armanda Magic MD Signature Date/Time: 09/03/2022/4:03:05 PM  Final    CARDIAC CATHETERIZATION  Result Date: 09/03/2022   LV end diastolic pressure is normal.  LVEDP 12 mm Hg.   There is no aortic valve stenosis.   Recommend Aspirin 81mg  daily for moderate CAD.   Mild, calcific nonobstructive CAD.   Aortic saturation 94%, PA saturation 68%, mean RA pressure 5 mmHg, PA pressure 32/15, mean PA pressure 24 mmHg, cardiac output 5.89 L/min, cardiac index 2.92. Continue preventive therapy. Tortuosity in the right subclavian and rotation of the heart make catheter engagement difficult.  AL-1 catheter needed to engage the RCA.  If repeat catheterization was needed in the future, would consider right femoral approach.   NM Pulmonary  Perfusion  Result Date: 09/02/2022 CLINICAL DATA:  Chest pain, nonspecific. Chronic shortness of breath. EXAM: NUCLEAR MEDICINE PERFUSION LUNG SCAN TECHNIQUE: Perfusion images were obtained in multiple projections after intravenous injection of radiopharmaceutical. Ventilation scans intentionally deferred if perfusion scan and chest x-ray adequate for interpretation during COVID 19 epidemic. RADIOPHARMACEUTICALS:  4.1 mCi Tc-59m MAA IV COMPARISON:  Chest x-ray 09/01/2022 FINDINGS: No segmental or subsegmental perfusion defects to suggest pulmonary embolus. IMPRESSION: No evidence of pulmonary embolus. Electronically Signed   By: Charlett Nose M.D.   On: 09/02/2022 12:45   DG Chest Portable 1 View  Result Date: 09/01/2022 CLINICAL DATA:  Shortness of breath EXAM: PORTABLE CHEST 1 VIEW COMPARISON:  X-ray 09/17/2021 and older FINDINGS: Enlarged cardiopericardial silhouette with vascular congestion. No pneumothorax, effusion or edema. Films are under penetrated. No consolidation. Overlapping cardiac leads. Calcified aorta. IMPRESSION: Enlarged cardiopericardial silhouette with vascular congestion. Electronically Signed   By: Karen Kays M.D.   On: 09/01/2022 17:50   US Venous Img Lower Bilateral  Result Date: 09/01/2022 CLINICAL DATA:  Bilateral lower extremity edema EXAM: BILATERAL LOWER EXTREMITY VENOUS DOPPLER ULTRASOUND TECHNIQUE: Gray-scale sonography with compression, as well as color and duplex ultrasound, were performed to evaluate the deep venous system(s) from the level of the common femoral vein through the popliteal and proximal calf veins. COMPARISON:  None Available. FINDINGS: VENOUS Normal compressibility of the common femoral, superficial femoral, and popliteal veins, as well as the visualized calf veins. Visualized portions of profunda femoral vein and great saphenous vein unremarkable. No filling defects to suggest DVT on grayscale or color Doppler imaging. Doppler waveforms show normal  direction of venous flow, normal respiratory plasticity and response to augmentation. OTHER Subcutaneous soft tissue edema overlying the bilateral calf. Limitations: none IMPRESSION: Negative. Electronically Signed   By: Agustin Cree M.D.   On: 09/01/2022 17:46      Subjective: Patient seen and examined at bedside today.  Hemodynamically stable.  On 2 L of oxygen.  Denies any chest pain or shortness of breath.  Long discussion held with the bedside with patient and her husband about discharge planning.  Discharge Exam: Vitals:   09/04/22 0543 09/04/22 0946  BP: 134/67 132/60  Pulse: 70   Resp: 17   Temp: 98.1 F (36.7 C)   SpO2: 91%    Vitals:   09/03/22 1950 09/04/22 0050 09/04/22 0543 09/04/22 0946  BP: 117/79 (!) 120/50 134/67 132/60  Pulse: 81 70 70   Resp: 20 20 17    Temp: 98.6 F (37 C) 98.4 F (36.9 C) 98.1 F (36.7 C)   TempSrc: Oral Oral Oral   SpO2:  94% 91%   Weight:   121.5 kg   Height:        General: Pt is alert, awake, not in acute distress,morbidly obese Cardiovascular: RRR,  S1/S2 +, no rubs, no gallops Respiratory: mildly diminished air sounds bilaterally, no wheezing, no rhonchi Abdominal: Soft, NT, ND, bowel sounds +, panniculitis Extremities: no edema, no cyanosis    The results of significant diagnostics from this hospitalization (including imaging, microbiology, ancillary and laboratory) are listed below for reference.     Microbiology: Recent Results (from the past 240 hour(s))  Urine Culture     Status: Abnormal   Collection Time: 09/01/22  7:28 PM   Specimen: Urine, Catheterized  Result Value Ref Range Status   Specimen Description   Final    URINE, CATHETERIZED Performed at [facility], [address], [place], [place] [address]    Special Requests   Final    NONE Performed at [facility], [address], [place], [place] [address]    Culture 2,000 COLONIES/mL ESCHERICHIA COLI (A)  Final   Report Status 09/04/2022 FINAL  Final      Labs: BNP (last 3 results) Recent Labs    08/26/22 1357 09/01/22 2020 09/04/22 1010  BNP 90.1 140.0* 70.9   Basic Metabolic Panel: Recent Labs  Lab 09/01/22 1332 09/02/22 0435 09/03/22 0427 09/03/22 1339 09/03/22 1350 09/04/22 0504  NA 137 135 135 135 136  136 131*  K 4.4 3.8 3.6 4.2 4.2  4.2 3.9  CL 96* 95* 93*  --   --  93*  CO2 32 33* 31  --   --  27  GLUCOSE 175* 142* 153*  --   --  300*  BUN 24* 20 21  --   --  33*  CREATININE 0.78 0.67 0.77  --   --  1.07*  CALCIUM 8.9 8.5* 8.9  --   --  8.6*   Liver Function Tests: Recent Labs  Lab 09/01/22 1332  AST 29  ALT 29  ALKPHOS 62  BILITOT 1.2  PROT 7.7  ALBUMIN 3.2*   Recent Labs  Lab 09/01/22 1332  LIPASE 68*   No results for input(s): "AMMONIA" in the last 168 hours. CBC: Recent Labs  Lab 09/01/22 1332 09/03/22 0427 09/03/22 1339 09/03/22 1350 09/04/22 0504  WBC 5.3 5.2  --   --  8.2  HGB 12.9 12.9 13.3 13.6  13.6 12.1  HCT 38.9 38.0 39.0 40.0  40.0 35.7*  MCV 97.5 97.7  --   --  93.5  PLT 121* 132*  --   --  120*   Cardiac Enzymes: No results for input(s): "CKTOTAL", "CKMB", "CKMBINDEX", "TROPONINI" in the last 168 hours. BNP: Invalid input(s): "POCBNP" CBG: No results for input(s): "GLUCAP" in the last 168 hours. D-Dimer Recent Labs    09/01/22 1429  DDIMER 2.55*   Hgb A1c No results for input(s): "HGBA1C" in the last 72 hours. Lipid Profile Recent Labs    09/03/22 0427  CHOL 154  HDL 35*  LDLCALC 87  TRIG 098*  CHOLHDL 4.4   Thyroid function studies No results for input(s): "TSH", "T4TOTAL", "T3FREE", "THYROIDAB" in the last 72 hours.  Invalid input(s): "FREET3" Anemia work up No results for input(s): "VITAMINB12", "FOLATE", "FERRITIN", "TIBC", "IRON", "RETICCTPCT" in the last 72 hours. Urinalysis    Component Value Date/Time   COLORURINE YELLOW 09/01/2022 1700   APPEARANCEUR CLOUDY (A) 09/01/2022 1700   LABSPEC 1.018 09/01/2022 1700   PHURINE 8.0 09/01/2022  1700   GLUCOSEU NEGATIVE 09/01/2022 1700   HGBUR MODERATE (A) 09/01/2022 1700   BILIRUBINUR NEGATIVE 09/01/2022 1700   KETONESUR NEGATIVE 09/01/2022 1700   PROTEINUR 100 (A) 09/01/2022 1700  UROBILINOGEN 1.0 04/10/2010 1637   NITRITE NEGATIVE 09/01/2022 1700   LEUKOCYTESUR LARGE (A) 09/01/2022 1700   Sepsis Labs Recent Labs  Lab 09/01/22 1332 09/03/22 0427 09/04/22 0504  WBC 5.3 5.2 8.2   Microbiology Recent Results (from the past 240 hour(s))  Urine Culture     Status: Abnormal   Collection Time: 09/01/22  7:28 PM   Specimen: Urine, Catheterized  Result Value Ref Range Status   Specimen Description   Final    URINE, CATHETERIZED Performed at [facility], [address], [place], [place] [address]    Special Requests   Final    NONE Performed at [facility], [address] [place], [place] [address]    Culture 2,000 COLONIES/mL ESCHERICHIA COLI (A)  Final   Report Status 09/04/2022 FINAL  Final    Please note: You were cared for by a hospitalist during your hospital stay. Once you are discharged, your primary care physician will handle any further medical issues. Please note that NO REFILLS for any discharge medications will be authorized once you are discharged, as it is imperative that you return to your primary care physician (or establish a relationship with a primary care physician if you do not have one) for your post hospital discharge needs so that they can reassess your need for medications and monitor your lab values.    Time coordinating discharge: 40 minutes  SIGNED:   Burnadette Pop, MD  [facility] 09/04/2022, 12:27 PM Pager [phone]  If 7PM-7AM, please contact night-coverage [email or web address] Password [email or web address]

## 2022-09-04 NOTE — Care Management Important Message (Signed)
Important Message  Patient Details  Name: Jenna Sherman MRN: [id number] Date of Birth: Feb 15, 1951   Medicare Important Message Given:  Yes     Renie Ora 09/04/2022, 9:36 AM

## 2022-09-04 NOTE — Consult Note (Signed)
   [facility] Inpatient Consult   09/04/2022  AQUASIA SHEFFEY 1951/01/06 [id number]  [facility] [[facility]]  Accountable Care Organization [ACO] Patient:  [employer] Medicare  Primary Care Provider:  Estanislado Pandy, MD with [facility]   Patient screened for hospitalization with noted low risk score for unplanned readmission risk transferred to [facility] from [facility] Cath noted and to assess for potential [facility]  [[facility]] Care Management service needs for post hospital transition for care coordination.  Review of patient's electronic medical record reveals patient is for home with [facility], per inpatient notes.  Was unable to contact patient via phone to assess further for needs with no answer and unable to leave a voicemail.   Plan:  Continue to follow progress and disposition to assess for post hospital community care coordination/management needs.  Referral request for community care coordination:  Will make a referral request for [facility] Care Coordinator to follow up for ongoing needs.  Hx with HF, awaiting DME delivery to residence noted per inpatient [facility] RNCM notes.  Of note, [facility] Care Management/Population Health does not replace or interfere with any arrangements made by the Inpatient Transition of Care team.  For questions contact:   Charlesetta Shanks, RN BSN CCM [facility]  [phone] business mobile phone Toll free office [phone]  *Concierge Line  [phone] Fax number: [phone] [email or web address]

## 2022-09-04 NOTE — Care Management (Cosign Needed)
    Durable Medical Equipment  (From admission, onward)           Start     Ordered   09/04/22 1152  For home use only DME Bedside commode  Once       Comments: Confined to one room  Question Answer Comment  Patient needs a bedside commode to treat with the following condition General weakness   Patient needs a bedside commode to treat with the following condition Congestive heart disease (HCC)      09/04/22 1151   09/04/22 1134  For home use only DME 3 n 1  Once        09/04/22 1133   09/04/22 1132  For home use only DME lightweight manual wheelchair with seat cushion  Once       Comments: Patient suffers from weakness which impairs their ability to perform daily activities like toileting in the home.  A walker will not resolve  issue with performing activities of daily living. A wheelchair will allow patient to safely perform daily activities. Patient is not able to propel themselves in the home using a standard weight wheelchair due to general weakness. Patient can self propel in the lightweight wheelchair. Length of need Lifetime. Accessories: elevating leg rests (ELRs), wheel locks, extensions and anti-tippers.   09/04/22 1133   09/04/22 1131  For home use only DME Walker rolling  Once       Comments: Youth walker pt is 4'8"  Question Answer Comment  Walker: With 5 Inch Wheels   Patient needs a walker to treat with the following condition Abnormal gait      09/04/22 1133

## 2022-09-04 NOTE — Care Management (Deleted)
    Durable Medical Equipment  (From admission, onward)           Start     Ordered   09/04/22 1134  For home use only DME 3 n 1  Once        09/04/22 1133   09/04/22 1132  For home use only DME lightweight manual wheelchair with seat cushion  Once       Comments: Patient suffers from weakness which impairs their ability to perform daily activities like toileting in the home.  A walker will not resolve  issue with performing activities of daily living. A wheelchair will allow patient to safely perform daily activities. Patient is not able to propel themselves in the home using a standard weight wheelchair due to general weakness. Patient can self propel in the lightweight wheelchair. Length of need Lifetime. Accessories: elevating leg rests (ELRs), wheel locks, extensions and anti-tippers.   09/04/22 1133   09/04/22 1131  For home use only DME Walker rolling  Once       Comments: Youth walker pt is 4'8"  Question Answer Comment  Walker: With 5 Inch Wheels   Patient needs a walker to treat with the following condition Abnormal gait      09/04/22 1133

## 2022-09-04 NOTE — Plan of Care (Signed)

## 2022-09-05 DIAGNOSIS — Z7951 Long term (current) use of inhaled steroids: Secondary | ICD-10-CM | POA: Diagnosis not present

## 2022-09-05 DIAGNOSIS — I872 Venous insufficiency (chronic) (peripheral): Secondary | ICD-10-CM | POA: Diagnosis not present

## 2022-09-05 DIAGNOSIS — G8929 Other chronic pain: Secondary | ICD-10-CM | POA: Diagnosis not present

## 2022-09-05 DIAGNOSIS — Z791 Long term (current) use of non-steroidal anti-inflammatories (NSAID): Secondary | ICD-10-CM | POA: Diagnosis not present

## 2022-09-05 DIAGNOSIS — Z9181 History of falling: Secondary | ICD-10-CM | POA: Diagnosis not present

## 2022-09-05 DIAGNOSIS — E785 Hyperlipidemia, unspecified: Secondary | ICD-10-CM | POA: Diagnosis not present

## 2022-09-05 DIAGNOSIS — F32A Depression, unspecified: Secondary | ICD-10-CM | POA: Diagnosis not present

## 2022-09-05 DIAGNOSIS — S41111D Laceration without foreign body of right upper arm, subsequent encounter: Secondary | ICD-10-CM | POA: Diagnosis not present

## 2022-09-05 DIAGNOSIS — R531 Weakness: Secondary | ICD-10-CM | POA: Diagnosis not present

## 2022-09-05 DIAGNOSIS — M549 Dorsalgia, unspecified: Secondary | ICD-10-CM | POA: Diagnosis not present

## 2022-09-05 DIAGNOSIS — Z604 Social exclusion and rejection: Secondary | ICD-10-CM | POA: Diagnosis not present

## 2022-09-05 DIAGNOSIS — G629 Polyneuropathy, unspecified: Secondary | ICD-10-CM | POA: Diagnosis not present

## 2022-09-05 DIAGNOSIS — J45909 Unspecified asthma, uncomplicated: Secondary | ICD-10-CM | POA: Diagnosis not present

## 2022-09-05 DIAGNOSIS — I509 Heart failure, unspecified: Secondary | ICD-10-CM | POA: Diagnosis not present

## 2022-09-05 DIAGNOSIS — I110 Hypertensive heart disease with heart failure: Secondary | ICD-10-CM | POA: Diagnosis not present

## 2022-09-05 LAB — LIPOPROTEIN A (LPA): Lipoprotein (a): 38.3 nmol/L — ABNORMAL HIGH (ref <75.0)

## 2022-09-08 DIAGNOSIS — I110 Hypertensive heart disease with heart failure: Secondary | ICD-10-CM | POA: Diagnosis not present

## 2022-09-08 DIAGNOSIS — Z9181 History of falling: Secondary | ICD-10-CM | POA: Diagnosis not present

## 2022-09-08 DIAGNOSIS — M549 Dorsalgia, unspecified: Secondary | ICD-10-CM | POA: Diagnosis not present

## 2022-09-08 DIAGNOSIS — F32A Depression, unspecified: Secondary | ICD-10-CM | POA: Diagnosis not present

## 2022-09-08 DIAGNOSIS — I872 Venous insufficiency (chronic) (peripheral): Secondary | ICD-10-CM | POA: Diagnosis not present

## 2022-09-08 DIAGNOSIS — Z7951 Long term (current) use of inhaled steroids: Secondary | ICD-10-CM | POA: Diagnosis not present

## 2022-09-08 DIAGNOSIS — I509 Heart failure, unspecified: Secondary | ICD-10-CM | POA: Diagnosis not present

## 2022-09-08 DIAGNOSIS — S41111D Laceration without foreign body of right upper arm, subsequent encounter: Secondary | ICD-10-CM | POA: Diagnosis not present

## 2022-09-08 DIAGNOSIS — J45909 Unspecified asthma, uncomplicated: Secondary | ICD-10-CM | POA: Diagnosis not present

## 2022-09-08 DIAGNOSIS — R531 Weakness: Secondary | ICD-10-CM | POA: Diagnosis not present

## 2022-09-08 DIAGNOSIS — Z791 Long term (current) use of non-steroidal anti-inflammatories (NSAID): Secondary | ICD-10-CM | POA: Diagnosis not present

## 2022-09-08 DIAGNOSIS — E785 Hyperlipidemia, unspecified: Secondary | ICD-10-CM | POA: Diagnosis not present

## 2022-09-08 DIAGNOSIS — Z604 Social exclusion and rejection: Secondary | ICD-10-CM | POA: Diagnosis not present

## 2022-09-08 DIAGNOSIS — G8929 Other chronic pain: Secondary | ICD-10-CM | POA: Diagnosis not present

## 2022-09-08 DIAGNOSIS — G629 Polyneuropathy, unspecified: Secondary | ICD-10-CM | POA: Diagnosis not present

## 2022-09-10 ENCOUNTER — Telehealth: Payer: Self-pay | Admitting: *Deleted

## 2022-09-10 NOTE — Progress Notes (Signed)
  Care Coordination  Outreach Note  09/10/2022 Name: LAVAUN GREENFIELD MRN: [id number] DOB: 1950-06-26   Care Coordination Outreach Attempts: An unsuccessful telephone outreach was attempted today to offer the patient information about available care coordination services.  Follow Up Plan:  Additional outreach attempts will be made to offer the patient care coordination information and services.   Encounter Outcome:  No Answer  Christie Nottingham  Care Coordination Care Guide  Direct Dial: [phone]

## 2022-09-11 DIAGNOSIS — Z7951 Long term (current) use of inhaled steroids: Secondary | ICD-10-CM | POA: Diagnosis not present

## 2022-09-11 DIAGNOSIS — I110 Hypertensive heart disease with heart failure: Secondary | ICD-10-CM | POA: Diagnosis not present

## 2022-09-11 DIAGNOSIS — E785 Hyperlipidemia, unspecified: Secondary | ICD-10-CM | POA: Diagnosis not present

## 2022-09-11 DIAGNOSIS — Z9181 History of falling: Secondary | ICD-10-CM | POA: Diagnosis not present

## 2022-09-11 DIAGNOSIS — S41111D Laceration without foreign body of right upper arm, subsequent encounter: Secondary | ICD-10-CM | POA: Diagnosis not present

## 2022-09-11 DIAGNOSIS — J45909 Unspecified asthma, uncomplicated: Secondary | ICD-10-CM | POA: Diagnosis not present

## 2022-09-11 DIAGNOSIS — Z604 Social exclusion and rejection: Secondary | ICD-10-CM | POA: Diagnosis not present

## 2022-09-11 DIAGNOSIS — Z791 Long term (current) use of non-steroidal anti-inflammatories (NSAID): Secondary | ICD-10-CM | POA: Diagnosis not present

## 2022-09-11 DIAGNOSIS — R531 Weakness: Secondary | ICD-10-CM | POA: Diagnosis not present

## 2022-09-11 DIAGNOSIS — G629 Polyneuropathy, unspecified: Secondary | ICD-10-CM | POA: Diagnosis not present

## 2022-09-11 DIAGNOSIS — I509 Heart failure, unspecified: Secondary | ICD-10-CM | POA: Diagnosis not present

## 2022-09-11 DIAGNOSIS — I872 Venous insufficiency (chronic) (peripheral): Secondary | ICD-10-CM | POA: Diagnosis not present

## 2022-09-11 DIAGNOSIS — M549 Dorsalgia, unspecified: Secondary | ICD-10-CM | POA: Diagnosis not present

## 2022-09-11 DIAGNOSIS — G8929 Other chronic pain: Secondary | ICD-10-CM | POA: Diagnosis not present

## 2022-09-11 DIAGNOSIS — F32A Depression, unspecified: Secondary | ICD-10-CM | POA: Diagnosis not present

## 2022-09-12 DIAGNOSIS — F32A Depression, unspecified: Secondary | ICD-10-CM | POA: Diagnosis not present

## 2022-09-12 DIAGNOSIS — G629 Polyneuropathy, unspecified: Secondary | ICD-10-CM | POA: Diagnosis not present

## 2022-09-12 DIAGNOSIS — I110 Hypertensive heart disease with heart failure: Secondary | ICD-10-CM | POA: Diagnosis not present

## 2022-09-12 DIAGNOSIS — R531 Weakness: Secondary | ICD-10-CM | POA: Diagnosis not present

## 2022-09-12 DIAGNOSIS — I872 Venous insufficiency (chronic) (peripheral): Secondary | ICD-10-CM | POA: Diagnosis not present

## 2022-09-12 DIAGNOSIS — I509 Heart failure, unspecified: Secondary | ICD-10-CM | POA: Diagnosis not present

## 2022-09-12 DIAGNOSIS — Z791 Long term (current) use of non-steroidal anti-inflammatories (NSAID): Secondary | ICD-10-CM | POA: Diagnosis not present

## 2022-09-12 DIAGNOSIS — Z9181 History of falling: Secondary | ICD-10-CM | POA: Diagnosis not present

## 2022-09-12 DIAGNOSIS — G8929 Other chronic pain: Secondary | ICD-10-CM | POA: Diagnosis not present

## 2022-09-12 DIAGNOSIS — Z7951 Long term (current) use of inhaled steroids: Secondary | ICD-10-CM | POA: Diagnosis not present

## 2022-09-12 DIAGNOSIS — Z604 Social exclusion and rejection: Secondary | ICD-10-CM | POA: Diagnosis not present

## 2022-09-12 DIAGNOSIS — J45909 Unspecified asthma, uncomplicated: Secondary | ICD-10-CM | POA: Diagnosis not present

## 2022-09-12 DIAGNOSIS — S41111D Laceration without foreign body of right upper arm, subsequent encounter: Secondary | ICD-10-CM | POA: Diagnosis not present

## 2022-09-12 DIAGNOSIS — E785 Hyperlipidemia, unspecified: Secondary | ICD-10-CM | POA: Diagnosis not present

## 2022-09-12 DIAGNOSIS — M549 Dorsalgia, unspecified: Secondary | ICD-10-CM | POA: Diagnosis not present

## 2022-09-15 ENCOUNTER — Ambulatory Visit: Payer: Medicare Other | Attending: Nurse Practitioner | Admitting: Nurse Practitioner

## 2022-09-15 ENCOUNTER — Ambulatory Visit: Payer: Medicare Other | Admitting: Nurse Practitioner

## 2022-09-15 ENCOUNTER — Encounter: Payer: Self-pay | Admitting: Nurse Practitioner

## 2022-09-15 VITALS — BP 158/76 | HR 53 | Ht <= 58 in | Wt 272.0 lb

## 2022-09-15 DIAGNOSIS — Z79899 Other long term (current) drug therapy: Secondary | ICD-10-CM | POA: Diagnosis not present

## 2022-09-15 DIAGNOSIS — I10 Essential (primary) hypertension: Secondary | ICD-10-CM

## 2022-09-15 DIAGNOSIS — I2510 Atherosclerotic heart disease of native coronary artery without angina pectoris: Secondary | ICD-10-CM

## 2022-09-15 DIAGNOSIS — J449 Chronic obstructive pulmonary disease, unspecified: Secondary | ICD-10-CM | POA: Diagnosis not present

## 2022-09-15 DIAGNOSIS — E785 Hyperlipidemia, unspecified: Secondary | ICD-10-CM

## 2022-09-15 DIAGNOSIS — N3000 Acute cystitis without hematuria: Secondary | ICD-10-CM

## 2022-09-15 DIAGNOSIS — I5032 Chronic diastolic (congestive) heart failure: Secondary | ICD-10-CM

## 2022-09-15 DIAGNOSIS — R531 Weakness: Secondary | ICD-10-CM | POA: Diagnosis not present

## 2022-09-15 NOTE — Patient Instructions (Addendum)
Medication Instructions:  Your physician recommends that you continue on your current medications as directed. Please refer to the Current Medication list given to you today.  Labwork: CBC, BMET on (09/29/22)  Testing/Procedures: none  Follow-Up: Your physician recommends that you schedule a follow-up appointment in: 6-8 weeks with peck  Any Other Special Instructions Will Be Listed Below (If Applicable).  If you need a refill on your cardiac medications before your next appointment, please call your pharmacy.

## 2022-09-15 NOTE — Progress Notes (Signed)
Office Visit    Patient Name: Jenna Sherman Date of Encounter: 09/15/2022  PCP:  Estanislado Pandy, MD   [facility]  Cardiologist:  Dina Rich, MD  Advanced Practice Provider:  No care team member to display Electrophysiologist:  None   Chief Complaint    Jenna Sherman is a 72 y.o. female with a mild CAD, chronic diastolic CHF, hx of hyperlipidemia, HTN, family hx of CVD, hx of chest pain, COPD, morbid obesity, and hx of vertigo, peripheral neuropathy, and hx of uterine cancer, who presents today for hospital follow-up.    Past Medical History    Past Medical History:  Diagnosis Date   Asthma    Chronic back pain    Depression    History of uterine cancer    History of vertigo    Hypertension    Normal LVF (EF 65%) by 2D Echo, August 2005. Mild left ventricular hypertrophy; mild mitral regurgitation   Hypertriglyceridemia    Morbid obesity (HCC)    Peripheral neuropathy    Past Surgical History:  Procedure Laterality Date   CARPAL TUNNEL RELEASE  07/30/2006   REPLACEMENT TOTAL KNEE Left    RIGHT/LEFT HEART CATH AND CORONARY ANGIOGRAPHY N/A 09/03/2022   Procedure: RIGHT/LEFT HEART CATH AND CORONARY ANGIOGRAPHY;  Surgeon: Corky Crafts, MD;  Location: [facility] INVASIVE CV LAB;  Service: Cardiovascular;  Laterality: N/A;   TOTAL ABDOMINAL HYSTERECTOMY W/ BILATERAL SALPINGOOPHORECTOMY     uterine cancer    Allergies  Allergies  Allergen Reactions   Ivp Dye [Iodinated Contrast Media] Shortness Of Breath    History of Present Illness    Jenna Sherman is a very pleasant 72 y.o. female with a PMH as mentioned above.   Last seen by Dr. Dina Rich on 06/11/2022.  Reported chest pain along mid chest at that office visit, stated lasted about 1-1/2 minutes, spread to her shoulders.  Associated with rest or activity, worse with walking and admitted to shortness of breath with her symptoms.  Her chest pain had progressed since last office visit.  Called back wanting to proceed with left heart cath.   Saw Dr. Vassie Loll on Aug 26, 2022.  Was arranged sleep study for OSA evaluation.  D-dimer elevated, Dr. Vassie Loll recommended proceeding with VQ scan as she cannot tolerate CT angio of chest due to history of IV contrast allergy.  He also recommended that patient undergo left and right heart cath.  Saw her 09/01/22 for follow-up with her husband.  Poor historian due to [facility].  History comes from husband who stated her chest pain was getting worse.  Patient noted  chest pain at rest and with exertion.  Still continued to have panic attacks and anxiety.  Husband stated feet and legs appear to look better recently in regards to edema. Was sent to ED for further evaluation.   Hospital admission 6/3 - 6/6 for chest pain, VQ scan without PE and lower extremity venous Dopplers negative for DVT.  Underwent cardiac cath at [facility], found to have mild to moderate nonobstructive CAD.  Limited echo revealed EF 60 to 65%, no RWMA, grade 1 DD.  Was given IV Lasix for acute on chronic diastolic heart failure, treated for UTI.  Today she presents for hospital follow-up.  Husband and patient states that she is feeling much better since discharge.  Patient admits to occasional, nonsevere twinges in her chest, denies any anginal chest pain.  Currently wearing 2 L of oxygen.  Denies any chest pain, shortness of breath, palpitations, syncope, presyncope, dizziness, orthopnea, PND, significant weight changes, acute bleeding, or claudication.  Swelling in bilateral lower extremities has improved.  EKGs/Labs/Other Studies Reviewed:   The following studies were reviewed today:   EKG:  EKG is not ordered today.  EKG dated July 21, 2022 revealed NSR, 60 bpm, incomplete RBBB, nonspecific ST wave changes, no acute ischemic changes.   Limited echo 09/03/2022: 1. Left ventricular ejection fraction, by estimation, is 60 to 65%. The  left ventricle has normal function. The left ventricle  has no regional  wall motion abnormalities. The left ventricular internal cavity size was  mildly dilated. Left ventricular  diastolic parameters are consistent with Grade I diastolic dysfunction  (impaired relaxation).   2. Right ventricular systolic function is normal. The right ventricular  size is normal. There is normal pulmonary artery systolic pressure. The  estimated right ventricular systolic pressure is 24.3 mmHg.   3. The mitral valve is normal in structure. No evidence of mitral valve  regurgitation. No evidence of mitral stenosis.   4. The aortic valve was not well visualized. Aortic valve regurgitation  is not visualized. No aortic stenosis is present.   5. The inferior vena cava is normal in size with greater than 50%  respiratory variability, suggesting right atrial pressure of 3 mmHg.   Right and left heart cath on 09/03/2022:   LV end diastolic pressure is normal.  LVEDP 12 mm Hg.   There is no aortic valve stenosis.   Recommend Aspirin 81mg  daily for moderate CAD.   Mild, calcific nonobstructive CAD.   Aortic saturation 94%, PA saturation 68%, mean RA pressure 5 mmHg, PA pressure 32/15, mean PA pressure 24 mmHg, cardiac output 5.89 L/min, cardiac index 2.92.   Continue preventive therapy.    Tortuosity in the right subclavian and rotation of the heart make catheter engagement difficult.  AL-1 catheter needed to engage the RCA.  If repeat catheterization was needed in the future, would consider right femoral approach.  Echo 09/2021: 1. Left ventricular ejection fraction, by estimation, is 70 to 75%. The  left ventricle has hyperdynamic function. Left ventricular endocardial  border not optimally defined to evaluate regional wall motion. Left  ventricular diastolic parameters are  indeterminate. Elevated left atrial pressure. The average left ventricular  global longitudinal strain is -21.0 %. The global longitudinal strain is normal.   2. Right ventricular systolic  function is normal. The right ventricular size is normal.   3. The mitral valve is normal in structure. No evidence of mitral valve regurgitation. No evidence of mitral stenosis.   4. The aortic valve was not well visualized. There is mild calcification of the aortic valve. There is mild thickening of the aortic valve. Aortic valve regurgitation is not visualized. No aortic stenosis is present.   Comparison(s): Echocardiogram done 06/15/13 showed an EF of 60-65%.   Recent Labs: 09/01/2022: ALT 29 09/04/2022: B Natriuretic Peptide 70.9; BUN 33; Creatinine, Ser 1.07; Hemoglobin 12.1; Platelets 120; Potassium 3.9; Sodium 131  Recent Lipid Panel    Component Value Date/Time   CHOL 154 09/03/2022 0427   TRIG 162 (H) 09/03/2022 0427   HDL 35 (L) 09/03/2022 0427   CHOLHDL 4.4 09/03/2022 0427   VLDL 32 09/03/2022 0427   LDLCALC 87 09/03/2022 0427     Home Medications   Current Meds  Medication Sig   albuterol (PROVENTIL) (2.5 MG/3ML) 0.083% nebulizer solution Take 2.5 mg by nebulization every 4 (four) hours as  needed for wheezing or shortness of breath.   aspirin EC 81 MG tablet Take 1 tablet (81 mg total) by mouth daily. Swallow whole.   atorvastatin (LIPITOR) 40 MG tablet Take 1 tablet (40 mg total) by mouth daily.   cetirizine (ZYRTEC) 10 MG tablet Take 1 tablet by mouth daily.   clonazePAM (KLONOPIN) 0.5 MG tablet Take 0.5 mg by mouth every 8 (eight) hours as needed for anxiety.   cyclobenzaprine (FLEXERIL) 10 MG tablet Take 15 mg by mouth at bedtime.   diclofenac (VOLTAREN) 75 MG EC tablet Take 1 tablet (75 mg total) by mouth every 12 (twelve) hours as needed.   escitalopram (LEXAPRO) 20 MG tablet Take 20 mg by mouth at bedtime.   fluticasone furoate-vilanterol (BREO ELLIPTA) 200-25 MCG/ACT AEPB Inhale 1 puff into the lungs daily.   furosemide (LASIX) 40 MG tablet Take 1 tablet (40 mg total) by mouth daily. Start taking from tomorrow   gabapentin (NEURONTIN) 300 MG capsule Take 600 mg by  mouth at bedtime.   meclizine (ANTIVERT) 25 MG tablet Take 25 mg by mouth 3 (three) times daily as needed for dizziness.   metoprolol succinate (TOPROL-XL) 50 MG 24 hr tablet Take 50 mg by mouth daily.   nitroGLYCERIN (NITROSTAT) 0.4 MG SL tablet Place 1 tablet (0.4 mg total) under the tongue every 5 (five) minutes x 3 doses as needed for chest pain (if no relief after 3rd dose, proceed to ED for evalution or call 911).   NYAMYC powder SMARTSIG:5 Gram(s) Topical 4 Times Daily   nystatin (MYCOSTATIN/NYSTOP) powder Apply topically 2 (two) times daily. Apply on abdominal folds   spironolactone (ALDACTONE) 25 MG tablet Take 1 tablet (25 mg total) by mouth daily.   Review of Systems    All other systems reviewed and are otherwise negative except as noted above.  Physical Exam    VS:  BP (!) 158/76   Pulse (!) 53   Ht 4\' 8"  (1.422 m)   Wt 272 lb (123.4 kg)   SpO2 94%   BMI 60.98 kg/m  , BMI Body mass index is 60.98 kg/m.  Wt Readings from Last 3 Encounters:  09/15/22 272 lb (123.4 kg)  09/04/22 267 lb 13.7 oz (121.5 kg)  09/01/22 274 lb 6.4 oz (124.5 kg)    Repeat BP: 125/81  GEN: Morbidly obese, 72 y.o. female, wearing chronic O2 HEENT: normal. Neck: Supple, no JVD, carotid bruits, or masses. Cardiac: S1/S2, RRR, no murmurs, rubs, or gallops. No clubbing, cyanosis.  Non pitting edema along BLE. Radials2+ /PT 1+ and equal bilaterally.  Respiratory:  Respirations regular and labored, clear to auscultation bilaterally. MS: No deformity or atrophy. Generalized weakness.  Skin: Warm and dry, no rash, continued skin discoloration along lower extremities, scattered bruising noted to bilateral forearms. Neuro:  Strength and sensation are intact. Psych: Normal affect.  Assessment & Plan    1.  CAD, medication management Stable with no anginal symptoms.  Mild to moderate calcific nonobstructive CAD noted during recent cardiac catheterization.  Continue aspirin, atorvastatin, Toprol-XL,  and nitroglycerin as needed.  Heart healthy diet encouraged.  Will obtain CBC and BMET in 2 weeks per protocol.  At next office visit, we will plan to obtain FLP and LFTs as she is now on atorvastatin 40 mg daily.  2.  Chronic diastolic CHF Stage C, NYHA class II-III symptoms.  Says she is feeling much better since hospital discharge.  Limited echo revealed normal EF.  Weight is stable. Euvolemic and well  compensated on exam.  Continue Lasix, metoprolol succinate, and spironolactone.  She is not a candidate for SGLT2 inhibitor as she is currently being treated for UTI. Low sodium diet, fluid restriction <2L, and daily weights encouraged. Educated to contact our office for weight gain of 2 lbs overnight or 5 lbs in one week.  Obtaining labs as mentioned above.  Last BNP normal.  3.  Hyperlipidemia Last lipid panel revealed LDL at 87, triglycerides at 162, HDL 35, rest of panel WNL.  She is currently on atorvastatin 40 mg daily.  We will continue current medication regimen.  At next office visit, plan to obtain FLP and LFT per protocol.  Heart healthy diet encouraged.  4.  Hypertension Blood pressure on arrival 158/76, repeat BP by me 125/81.  Continue current medication regimen.  Healthy diet encouraged.  Obtaining labs as mentioned above.  5.  COPD Currently wearing 2 L of oxygen continuously per husband's report.  Denies any recent symptoms.  No change in medication regimen.  Continue to follow-up with Dr. Vassie Loll as scheduled.  6.  UTI Still currently being treated for this per husband's report.  No change in medication regimen.  Continue to follow-up with Dr. Neita Carp.  7.  Generalized weakness Continue to work with home health therapy.  Disposition: Follow up in 6-8 weeks with Dina Rich, MD or APP.  Signed, Sharlene Dory, NP 09/15/2022, 9:12 AM  [facility]

## 2022-09-15 NOTE — Progress Notes (Signed)
  Care Coordination  Outreach Note  09/15/2022 Name: Jenna Sherman MRN: [id number] DOB: 1951/02/11   Care Coordination Outreach Attempts: A third unsuccessful outreach was attempted today to offer the patient with information about available care coordination services.  Follow Up Plan:  Additional outreach attempts will be made to offer the patient care coordination information and services.   Encounter Outcome:  No Answer  Christie Nottingham  Care Coordination Care Guide  Direct Dial: [phone]

## 2022-09-16 ENCOUNTER — Ambulatory Visit: Payer: Medicare Other | Admitting: Nurse Practitioner

## 2022-09-16 DIAGNOSIS — R319 Hematuria, unspecified: Secondary | ICD-10-CM | POA: Diagnosis not present

## 2022-09-16 DIAGNOSIS — G4730 Sleep apnea, unspecified: Secondary | ICD-10-CM | POA: Diagnosis not present

## 2022-09-16 DIAGNOSIS — I5033 Acute on chronic diastolic (congestive) heart failure: Secondary | ICD-10-CM | POA: Diagnosis not present

## 2022-09-16 DIAGNOSIS — J9611 Chronic respiratory failure with hypoxia: Secondary | ICD-10-CM | POA: Diagnosis not present

## 2022-09-17 DIAGNOSIS — Z9181 History of falling: Secondary | ICD-10-CM | POA: Diagnosis not present

## 2022-09-17 DIAGNOSIS — S41111D Laceration without foreign body of right upper arm, subsequent encounter: Secondary | ICD-10-CM | POA: Diagnosis not present

## 2022-09-17 DIAGNOSIS — G8929 Other chronic pain: Secondary | ICD-10-CM | POA: Diagnosis not present

## 2022-09-17 DIAGNOSIS — E785 Hyperlipidemia, unspecified: Secondary | ICD-10-CM | POA: Diagnosis not present

## 2022-09-17 DIAGNOSIS — Z604 Social exclusion and rejection: Secondary | ICD-10-CM | POA: Diagnosis not present

## 2022-09-17 DIAGNOSIS — I872 Venous insufficiency (chronic) (peripheral): Secondary | ICD-10-CM | POA: Diagnosis not present

## 2022-09-17 DIAGNOSIS — J45909 Unspecified asthma, uncomplicated: Secondary | ICD-10-CM | POA: Diagnosis not present

## 2022-09-17 DIAGNOSIS — R531 Weakness: Secondary | ICD-10-CM | POA: Diagnosis not present

## 2022-09-17 DIAGNOSIS — G629 Polyneuropathy, unspecified: Secondary | ICD-10-CM | POA: Diagnosis not present

## 2022-09-17 DIAGNOSIS — Z7951 Long term (current) use of inhaled steroids: Secondary | ICD-10-CM | POA: Diagnosis not present

## 2022-09-17 DIAGNOSIS — F32A Depression, unspecified: Secondary | ICD-10-CM | POA: Diagnosis not present

## 2022-09-17 DIAGNOSIS — Z791 Long term (current) use of non-steroidal anti-inflammatories (NSAID): Secondary | ICD-10-CM | POA: Diagnosis not present

## 2022-09-17 DIAGNOSIS — I110 Hypertensive heart disease with heart failure: Secondary | ICD-10-CM | POA: Diagnosis not present

## 2022-09-17 DIAGNOSIS — I509 Heart failure, unspecified: Secondary | ICD-10-CM | POA: Diagnosis not present

## 2022-09-17 DIAGNOSIS — M549 Dorsalgia, unspecified: Secondary | ICD-10-CM | POA: Diagnosis not present

## 2022-09-19 DIAGNOSIS — E785 Hyperlipidemia, unspecified: Secondary | ICD-10-CM | POA: Diagnosis not present

## 2022-09-19 DIAGNOSIS — I872 Venous insufficiency (chronic) (peripheral): Secondary | ICD-10-CM | POA: Diagnosis not present

## 2022-09-19 DIAGNOSIS — Z791 Long term (current) use of non-steroidal anti-inflammatories (NSAID): Secondary | ICD-10-CM | POA: Diagnosis not present

## 2022-09-19 DIAGNOSIS — G629 Polyneuropathy, unspecified: Secondary | ICD-10-CM | POA: Diagnosis not present

## 2022-09-19 DIAGNOSIS — J45909 Unspecified asthma, uncomplicated: Secondary | ICD-10-CM | POA: Diagnosis not present

## 2022-09-19 DIAGNOSIS — I509 Heart failure, unspecified: Secondary | ICD-10-CM | POA: Diagnosis not present

## 2022-09-19 DIAGNOSIS — S41111D Laceration without foreign body of right upper arm, subsequent encounter: Secondary | ICD-10-CM | POA: Diagnosis not present

## 2022-09-19 DIAGNOSIS — R531 Weakness: Secondary | ICD-10-CM | POA: Diagnosis not present

## 2022-09-19 DIAGNOSIS — Z604 Social exclusion and rejection: Secondary | ICD-10-CM | POA: Diagnosis not present

## 2022-09-19 DIAGNOSIS — M549 Dorsalgia, unspecified: Secondary | ICD-10-CM | POA: Diagnosis not present

## 2022-09-19 DIAGNOSIS — Z7951 Long term (current) use of inhaled steroids: Secondary | ICD-10-CM | POA: Diagnosis not present

## 2022-09-19 DIAGNOSIS — I110 Hypertensive heart disease with heart failure: Secondary | ICD-10-CM | POA: Diagnosis not present

## 2022-09-19 DIAGNOSIS — Z9181 History of falling: Secondary | ICD-10-CM | POA: Diagnosis not present

## 2022-09-19 DIAGNOSIS — G8929 Other chronic pain: Secondary | ICD-10-CM | POA: Diagnosis not present

## 2022-09-19 DIAGNOSIS — F32A Depression, unspecified: Secondary | ICD-10-CM | POA: Diagnosis not present

## 2022-09-24 DIAGNOSIS — Z791 Long term (current) use of non-steroidal anti-inflammatories (NSAID): Secondary | ICD-10-CM | POA: Diagnosis not present

## 2022-09-24 DIAGNOSIS — F32A Depression, unspecified: Secondary | ICD-10-CM | POA: Diagnosis not present

## 2022-09-24 DIAGNOSIS — E785 Hyperlipidemia, unspecified: Secondary | ICD-10-CM | POA: Diagnosis not present

## 2022-09-24 DIAGNOSIS — Z9181 History of falling: Secondary | ICD-10-CM | POA: Diagnosis not present

## 2022-09-24 DIAGNOSIS — Z604 Social exclusion and rejection: Secondary | ICD-10-CM | POA: Diagnosis not present

## 2022-09-24 DIAGNOSIS — G8929 Other chronic pain: Secondary | ICD-10-CM | POA: Diagnosis not present

## 2022-09-24 DIAGNOSIS — R531 Weakness: Secondary | ICD-10-CM | POA: Diagnosis not present

## 2022-09-24 DIAGNOSIS — I509 Heart failure, unspecified: Secondary | ICD-10-CM | POA: Diagnosis not present

## 2022-09-24 DIAGNOSIS — S41111D Laceration without foreign body of right upper arm, subsequent encounter: Secondary | ICD-10-CM | POA: Diagnosis not present

## 2022-09-24 DIAGNOSIS — Z7951 Long term (current) use of inhaled steroids: Secondary | ICD-10-CM | POA: Diagnosis not present

## 2022-09-24 DIAGNOSIS — I872 Venous insufficiency (chronic) (peripheral): Secondary | ICD-10-CM | POA: Diagnosis not present

## 2022-09-24 DIAGNOSIS — G629 Polyneuropathy, unspecified: Secondary | ICD-10-CM | POA: Diagnosis not present

## 2022-09-24 DIAGNOSIS — M549 Dorsalgia, unspecified: Secondary | ICD-10-CM | POA: Diagnosis not present

## 2022-09-24 DIAGNOSIS — I110 Hypertensive heart disease with heart failure: Secondary | ICD-10-CM | POA: Diagnosis not present

## 2022-09-24 DIAGNOSIS — J45909 Unspecified asthma, uncomplicated: Secondary | ICD-10-CM | POA: Diagnosis not present

## 2022-09-25 DIAGNOSIS — J45909 Unspecified asthma, uncomplicated: Secondary | ICD-10-CM | POA: Diagnosis not present

## 2022-09-25 DIAGNOSIS — S41111D Laceration without foreign body of right upper arm, subsequent encounter: Secondary | ICD-10-CM | POA: Diagnosis not present

## 2022-09-25 DIAGNOSIS — I110 Hypertensive heart disease with heart failure: Secondary | ICD-10-CM | POA: Diagnosis not present

## 2022-09-25 DIAGNOSIS — G629 Polyneuropathy, unspecified: Secondary | ICD-10-CM | POA: Diagnosis not present

## 2022-09-25 DIAGNOSIS — Z791 Long term (current) use of non-steroidal anti-inflammatories (NSAID): Secondary | ICD-10-CM | POA: Diagnosis not present

## 2022-09-25 DIAGNOSIS — M549 Dorsalgia, unspecified: Secondary | ICD-10-CM | POA: Diagnosis not present

## 2022-09-25 DIAGNOSIS — G8929 Other chronic pain: Secondary | ICD-10-CM | POA: Diagnosis not present

## 2022-09-25 DIAGNOSIS — F32A Depression, unspecified: Secondary | ICD-10-CM | POA: Diagnosis not present

## 2022-09-25 DIAGNOSIS — E785 Hyperlipidemia, unspecified: Secondary | ICD-10-CM | POA: Diagnosis not present

## 2022-09-25 DIAGNOSIS — Z7951 Long term (current) use of inhaled steroids: Secondary | ICD-10-CM | POA: Diagnosis not present

## 2022-09-25 DIAGNOSIS — Z9181 History of falling: Secondary | ICD-10-CM | POA: Diagnosis not present

## 2022-09-25 DIAGNOSIS — Z604 Social exclusion and rejection: Secondary | ICD-10-CM | POA: Diagnosis not present

## 2022-09-25 DIAGNOSIS — I872 Venous insufficiency (chronic) (peripheral): Secondary | ICD-10-CM | POA: Diagnosis not present

## 2022-09-25 DIAGNOSIS — R531 Weakness: Secondary | ICD-10-CM | POA: Diagnosis not present

## 2022-09-25 DIAGNOSIS — I509 Heart failure, unspecified: Secondary | ICD-10-CM | POA: Diagnosis not present

## 2022-09-26 DIAGNOSIS — J9610 Chronic respiratory failure, unspecified whether with hypoxia or hypercapnia: Secondary | ICD-10-CM | POA: Diagnosis not present

## 2022-10-01 DIAGNOSIS — Z7982 Long term (current) use of aspirin: Secondary | ICD-10-CM | POA: Diagnosis not present

## 2022-10-01 DIAGNOSIS — H9190 Unspecified hearing loss, unspecified ear: Secondary | ICD-10-CM | POA: Diagnosis not present

## 2022-10-01 DIAGNOSIS — F32A Depression, unspecified: Secondary | ICD-10-CM | POA: Diagnosis not present

## 2022-10-01 DIAGNOSIS — Z792 Long term (current) use of antibiotics: Secondary | ICD-10-CM | POA: Diagnosis not present

## 2022-10-01 DIAGNOSIS — Z556 Problems related to health literacy: Secondary | ICD-10-CM | POA: Diagnosis not present

## 2022-10-01 DIAGNOSIS — Z7951 Long term (current) use of inhaled steroids: Secondary | ICD-10-CM | POA: Diagnosis not present

## 2022-10-01 DIAGNOSIS — G8929 Other chronic pain: Secondary | ICD-10-CM | POA: Diagnosis not present

## 2022-10-01 DIAGNOSIS — G4733 Obstructive sleep apnea (adult) (pediatric): Secondary | ICD-10-CM | POA: Diagnosis not present

## 2022-10-01 DIAGNOSIS — Z87891 Personal history of nicotine dependence: Secondary | ICD-10-CM | POA: Diagnosis not present

## 2022-10-01 DIAGNOSIS — I872 Venous insufficiency (chronic) (peripheral): Secondary | ICD-10-CM | POA: Diagnosis not present

## 2022-10-01 DIAGNOSIS — J4489 Other specified chronic obstructive pulmonary disease: Secondary | ICD-10-CM | POA: Diagnosis not present

## 2022-10-01 DIAGNOSIS — G629 Polyneuropathy, unspecified: Secondary | ICD-10-CM | POA: Diagnosis not present

## 2022-10-01 DIAGNOSIS — E781 Pure hyperglyceridemia: Secondary | ICD-10-CM | POA: Diagnosis not present

## 2022-10-01 DIAGNOSIS — N390 Urinary tract infection, site not specified: Secondary | ICD-10-CM | POA: Diagnosis not present

## 2022-10-01 DIAGNOSIS — J9611 Chronic respiratory failure with hypoxia: Secondary | ICD-10-CM | POA: Diagnosis not present

## 2022-10-01 DIAGNOSIS — R001 Bradycardia, unspecified: Secondary | ICD-10-CM | POA: Diagnosis not present

## 2022-10-01 DIAGNOSIS — Z791 Long term (current) use of non-steroidal anti-inflammatories (NSAID): Secondary | ICD-10-CM | POA: Diagnosis not present

## 2022-10-01 DIAGNOSIS — I110 Hypertensive heart disease with heart failure: Secondary | ICD-10-CM | POA: Diagnosis not present

## 2022-10-01 DIAGNOSIS — I5033 Acute on chronic diastolic (congestive) heart failure: Secondary | ICD-10-CM | POA: Diagnosis not present

## 2022-10-01 DIAGNOSIS — Z604 Social exclusion and rejection: Secondary | ICD-10-CM | POA: Diagnosis not present

## 2022-10-01 DIAGNOSIS — M549 Dorsalgia, unspecified: Secondary | ICD-10-CM | POA: Diagnosis not present

## 2022-10-01 DIAGNOSIS — Z9181 History of falling: Secondary | ICD-10-CM | POA: Diagnosis not present

## 2022-10-04 DIAGNOSIS — J9611 Chronic respiratory failure with hypoxia: Secondary | ICD-10-CM | POA: Diagnosis not present

## 2022-10-04 DIAGNOSIS — G4730 Sleep apnea, unspecified: Secondary | ICD-10-CM | POA: Diagnosis not present

## 2022-10-04 DIAGNOSIS — R531 Weakness: Secondary | ICD-10-CM | POA: Diagnosis not present

## 2022-10-04 DIAGNOSIS — I5033 Acute on chronic diastolic (congestive) heart failure: Secondary | ICD-10-CM | POA: Diagnosis not present

## 2022-10-04 DIAGNOSIS — R319 Hematuria, unspecified: Secondary | ICD-10-CM | POA: Diagnosis not present

## 2022-10-06 DIAGNOSIS — G629 Polyneuropathy, unspecified: Secondary | ICD-10-CM | POA: Diagnosis not present

## 2022-10-06 DIAGNOSIS — M549 Dorsalgia, unspecified: Secondary | ICD-10-CM | POA: Diagnosis not present

## 2022-10-06 DIAGNOSIS — G4733 Obstructive sleep apnea (adult) (pediatric): Secondary | ICD-10-CM | POA: Diagnosis not present

## 2022-10-06 DIAGNOSIS — Z7982 Long term (current) use of aspirin: Secondary | ICD-10-CM | POA: Diagnosis not present

## 2022-10-06 DIAGNOSIS — G8929 Other chronic pain: Secondary | ICD-10-CM | POA: Diagnosis not present

## 2022-10-06 DIAGNOSIS — I872 Venous insufficiency (chronic) (peripheral): Secondary | ICD-10-CM | POA: Diagnosis not present

## 2022-10-06 DIAGNOSIS — Z87891 Personal history of nicotine dependence: Secondary | ICD-10-CM | POA: Diagnosis not present

## 2022-10-06 DIAGNOSIS — N390 Urinary tract infection, site not specified: Secondary | ICD-10-CM | POA: Diagnosis not present

## 2022-10-06 DIAGNOSIS — J4489 Other specified chronic obstructive pulmonary disease: Secondary | ICD-10-CM | POA: Diagnosis not present

## 2022-10-06 DIAGNOSIS — I5033 Acute on chronic diastolic (congestive) heart failure: Secondary | ICD-10-CM | POA: Diagnosis not present

## 2022-10-06 DIAGNOSIS — Z9181 History of falling: Secondary | ICD-10-CM | POA: Diagnosis not present

## 2022-10-06 DIAGNOSIS — R001 Bradycardia, unspecified: Secondary | ICD-10-CM | POA: Diagnosis not present

## 2022-10-06 DIAGNOSIS — H9190 Unspecified hearing loss, unspecified ear: Secondary | ICD-10-CM | POA: Diagnosis not present

## 2022-10-06 DIAGNOSIS — Z792 Long term (current) use of antibiotics: Secondary | ICD-10-CM | POA: Diagnosis not present

## 2022-10-06 DIAGNOSIS — Z556 Problems related to health literacy: Secondary | ICD-10-CM | POA: Diagnosis not present

## 2022-10-06 DIAGNOSIS — F32A Depression, unspecified: Secondary | ICD-10-CM | POA: Diagnosis not present

## 2022-10-06 DIAGNOSIS — Z7951 Long term (current) use of inhaled steroids: Secondary | ICD-10-CM | POA: Diagnosis not present

## 2022-10-06 DIAGNOSIS — Z791 Long term (current) use of non-steroidal anti-inflammatories (NSAID): Secondary | ICD-10-CM | POA: Diagnosis not present

## 2022-10-06 DIAGNOSIS — E781 Pure hyperglyceridemia: Secondary | ICD-10-CM | POA: Diagnosis not present

## 2022-10-06 DIAGNOSIS — J9611 Chronic respiratory failure with hypoxia: Secondary | ICD-10-CM | POA: Diagnosis not present

## 2022-10-06 DIAGNOSIS — I110 Hypertensive heart disease with heart failure: Secondary | ICD-10-CM | POA: Diagnosis not present

## 2022-10-06 DIAGNOSIS — Z604 Social exclusion and rejection: Secondary | ICD-10-CM | POA: Diagnosis not present

## 2022-10-08 DIAGNOSIS — R001 Bradycardia, unspecified: Secondary | ICD-10-CM | POA: Diagnosis not present

## 2022-10-08 DIAGNOSIS — I5033 Acute on chronic diastolic (congestive) heart failure: Secondary | ICD-10-CM | POA: Diagnosis not present

## 2022-10-08 DIAGNOSIS — H9190 Unspecified hearing loss, unspecified ear: Secondary | ICD-10-CM | POA: Diagnosis not present

## 2022-10-08 DIAGNOSIS — Z7951 Long term (current) use of inhaled steroids: Secondary | ICD-10-CM | POA: Diagnosis not present

## 2022-10-08 DIAGNOSIS — N390 Urinary tract infection, site not specified: Secondary | ICD-10-CM | POA: Diagnosis not present

## 2022-10-08 DIAGNOSIS — J4489 Other specified chronic obstructive pulmonary disease: Secondary | ICD-10-CM | POA: Diagnosis not present

## 2022-10-08 DIAGNOSIS — Z791 Long term (current) use of non-steroidal anti-inflammatories (NSAID): Secondary | ICD-10-CM | POA: Diagnosis not present

## 2022-10-08 DIAGNOSIS — J9611 Chronic respiratory failure with hypoxia: Secondary | ICD-10-CM | POA: Diagnosis not present

## 2022-10-08 DIAGNOSIS — Z604 Social exclusion and rejection: Secondary | ICD-10-CM | POA: Diagnosis not present

## 2022-10-08 DIAGNOSIS — E781 Pure hyperglyceridemia: Secondary | ICD-10-CM | POA: Diagnosis not present

## 2022-10-08 DIAGNOSIS — G4733 Obstructive sleep apnea (adult) (pediatric): Secondary | ICD-10-CM | POA: Diagnosis not present

## 2022-10-08 DIAGNOSIS — Z7982 Long term (current) use of aspirin: Secondary | ICD-10-CM | POA: Diagnosis not present

## 2022-10-08 DIAGNOSIS — Z792 Long term (current) use of antibiotics: Secondary | ICD-10-CM | POA: Diagnosis not present

## 2022-10-08 DIAGNOSIS — M549 Dorsalgia, unspecified: Secondary | ICD-10-CM | POA: Diagnosis not present

## 2022-10-08 DIAGNOSIS — Z9181 History of falling: Secondary | ICD-10-CM | POA: Diagnosis not present

## 2022-10-08 DIAGNOSIS — G8929 Other chronic pain: Secondary | ICD-10-CM | POA: Diagnosis not present

## 2022-10-08 DIAGNOSIS — I872 Venous insufficiency (chronic) (peripheral): Secondary | ICD-10-CM | POA: Diagnosis not present

## 2022-10-08 DIAGNOSIS — Z556 Problems related to health literacy: Secondary | ICD-10-CM | POA: Diagnosis not present

## 2022-10-08 DIAGNOSIS — F32A Depression, unspecified: Secondary | ICD-10-CM | POA: Diagnosis not present

## 2022-10-08 DIAGNOSIS — I110 Hypertensive heart disease with heart failure: Secondary | ICD-10-CM | POA: Diagnosis not present

## 2022-10-08 DIAGNOSIS — G629 Polyneuropathy, unspecified: Secondary | ICD-10-CM | POA: Diagnosis not present

## 2022-10-08 DIAGNOSIS — Z87891 Personal history of nicotine dependence: Secondary | ICD-10-CM | POA: Diagnosis not present

## 2022-10-12 DIAGNOSIS — G4733 Obstructive sleep apnea (adult) (pediatric): Secondary | ICD-10-CM | POA: Diagnosis not present

## 2022-10-12 DIAGNOSIS — Z7982 Long term (current) use of aspirin: Secondary | ICD-10-CM | POA: Diagnosis not present

## 2022-10-12 DIAGNOSIS — G8929 Other chronic pain: Secondary | ICD-10-CM | POA: Diagnosis not present

## 2022-10-12 DIAGNOSIS — N390 Urinary tract infection, site not specified: Secondary | ICD-10-CM | POA: Diagnosis not present

## 2022-10-12 DIAGNOSIS — M549 Dorsalgia, unspecified: Secondary | ICD-10-CM | POA: Diagnosis not present

## 2022-10-12 DIAGNOSIS — Z87891 Personal history of nicotine dependence: Secondary | ICD-10-CM | POA: Diagnosis not present

## 2022-10-12 DIAGNOSIS — Z7951 Long term (current) use of inhaled steroids: Secondary | ICD-10-CM | POA: Diagnosis not present

## 2022-10-12 DIAGNOSIS — I872 Venous insufficiency (chronic) (peripheral): Secondary | ICD-10-CM | POA: Diagnosis not present

## 2022-10-12 DIAGNOSIS — I5033 Acute on chronic diastolic (congestive) heart failure: Secondary | ICD-10-CM | POA: Diagnosis not present

## 2022-10-12 DIAGNOSIS — Z791 Long term (current) use of non-steroidal anti-inflammatories (NSAID): Secondary | ICD-10-CM | POA: Diagnosis not present

## 2022-10-12 DIAGNOSIS — G629 Polyneuropathy, unspecified: Secondary | ICD-10-CM | POA: Diagnosis not present

## 2022-10-12 DIAGNOSIS — J9611 Chronic respiratory failure with hypoxia: Secondary | ICD-10-CM | POA: Diagnosis not present

## 2022-10-12 DIAGNOSIS — I110 Hypertensive heart disease with heart failure: Secondary | ICD-10-CM | POA: Diagnosis not present

## 2022-10-12 DIAGNOSIS — Z9181 History of falling: Secondary | ICD-10-CM | POA: Diagnosis not present

## 2022-10-12 DIAGNOSIS — J4489 Other specified chronic obstructive pulmonary disease: Secondary | ICD-10-CM | POA: Diagnosis not present

## 2022-10-12 DIAGNOSIS — Z556 Problems related to health literacy: Secondary | ICD-10-CM | POA: Diagnosis not present

## 2022-10-12 DIAGNOSIS — Z792 Long term (current) use of antibiotics: Secondary | ICD-10-CM | POA: Diagnosis not present

## 2022-10-12 DIAGNOSIS — Z604 Social exclusion and rejection: Secondary | ICD-10-CM | POA: Diagnosis not present

## 2022-10-12 DIAGNOSIS — H9190 Unspecified hearing loss, unspecified ear: Secondary | ICD-10-CM | POA: Diagnosis not present

## 2022-10-12 DIAGNOSIS — E781 Pure hyperglyceridemia: Secondary | ICD-10-CM | POA: Diagnosis not present

## 2022-10-12 DIAGNOSIS — R001 Bradycardia, unspecified: Secondary | ICD-10-CM | POA: Diagnosis not present

## 2022-10-12 DIAGNOSIS — F32A Depression, unspecified: Secondary | ICD-10-CM | POA: Diagnosis not present

## 2022-10-14 DIAGNOSIS — R300 Dysuria: Secondary | ICD-10-CM | POA: Diagnosis not present

## 2022-10-15 ENCOUNTER — Encounter: Payer: Self-pay | Admitting: Pulmonary Disease

## 2022-10-15 ENCOUNTER — Ambulatory Visit: Payer: Medicare Other | Admitting: Pulmonary Disease

## 2022-10-21 DIAGNOSIS — J9611 Chronic respiratory failure with hypoxia: Secondary | ICD-10-CM | POA: Diagnosis not present

## 2022-10-21 DIAGNOSIS — I110 Hypertensive heart disease with heart failure: Secondary | ICD-10-CM | POA: Diagnosis not present

## 2022-10-21 DIAGNOSIS — Z792 Long term (current) use of antibiotics: Secondary | ICD-10-CM | POA: Diagnosis not present

## 2022-10-21 DIAGNOSIS — Z556 Problems related to health literacy: Secondary | ICD-10-CM | POA: Diagnosis not present

## 2022-10-21 DIAGNOSIS — E781 Pure hyperglyceridemia: Secondary | ICD-10-CM | POA: Diagnosis not present

## 2022-10-21 DIAGNOSIS — R001 Bradycardia, unspecified: Secondary | ICD-10-CM | POA: Diagnosis not present

## 2022-10-21 DIAGNOSIS — I10 Essential (primary) hypertension: Secondary | ICD-10-CM | POA: Diagnosis not present

## 2022-10-21 DIAGNOSIS — J4489 Other specified chronic obstructive pulmonary disease: Secondary | ICD-10-CM | POA: Diagnosis not present

## 2022-10-21 DIAGNOSIS — Z87891 Personal history of nicotine dependence: Secondary | ICD-10-CM | POA: Diagnosis not present

## 2022-10-21 DIAGNOSIS — R7301 Impaired fasting glucose: Secondary | ICD-10-CM | POA: Diagnosis not present

## 2022-10-21 DIAGNOSIS — Z7951 Long term (current) use of inhaled steroids: Secondary | ICD-10-CM | POA: Diagnosis not present

## 2022-10-21 DIAGNOSIS — R5383 Other fatigue: Secondary | ICD-10-CM | POA: Diagnosis not present

## 2022-10-21 DIAGNOSIS — Z7982 Long term (current) use of aspirin: Secondary | ICD-10-CM | POA: Diagnosis not present

## 2022-10-21 DIAGNOSIS — N390 Urinary tract infection, site not specified: Secondary | ICD-10-CM | POA: Diagnosis not present

## 2022-10-21 DIAGNOSIS — Z791 Long term (current) use of non-steroidal anti-inflammatories (NSAID): Secondary | ICD-10-CM | POA: Diagnosis not present

## 2022-10-21 DIAGNOSIS — I5033 Acute on chronic diastolic (congestive) heart failure: Secondary | ICD-10-CM | POA: Diagnosis not present

## 2022-10-21 DIAGNOSIS — Z604 Social exclusion and rejection: Secondary | ICD-10-CM | POA: Diagnosis not present

## 2022-10-21 DIAGNOSIS — M549 Dorsalgia, unspecified: Secondary | ICD-10-CM | POA: Diagnosis not present

## 2022-10-21 DIAGNOSIS — G629 Polyneuropathy, unspecified: Secondary | ICD-10-CM | POA: Diagnosis not present

## 2022-10-21 DIAGNOSIS — E7849 Other hyperlipidemia: Secondary | ICD-10-CM | POA: Diagnosis not present

## 2022-10-21 DIAGNOSIS — Z9181 History of falling: Secondary | ICD-10-CM | POA: Diagnosis not present

## 2022-10-21 DIAGNOSIS — I872 Venous insufficiency (chronic) (peripheral): Secondary | ICD-10-CM | POA: Diagnosis not present

## 2022-10-21 DIAGNOSIS — G8929 Other chronic pain: Secondary | ICD-10-CM | POA: Diagnosis not present

## 2022-10-21 DIAGNOSIS — F32A Depression, unspecified: Secondary | ICD-10-CM | POA: Diagnosis not present

## 2022-10-21 DIAGNOSIS — K2100 Gastro-esophageal reflux disease with esophagitis, without bleeding: Secondary | ICD-10-CM | POA: Diagnosis not present

## 2022-10-21 DIAGNOSIS — G4733 Obstructive sleep apnea (adult) (pediatric): Secondary | ICD-10-CM | POA: Diagnosis not present

## 2022-10-21 DIAGNOSIS — H9190 Unspecified hearing loss, unspecified ear: Secondary | ICD-10-CM | POA: Diagnosis not present

## 2022-10-22 DIAGNOSIS — N390 Urinary tract infection, site not specified: Secondary | ICD-10-CM | POA: Diagnosis not present

## 2022-10-22 DIAGNOSIS — F32A Depression, unspecified: Secondary | ICD-10-CM | POA: Diagnosis not present

## 2022-10-22 DIAGNOSIS — J4489 Other specified chronic obstructive pulmonary disease: Secondary | ICD-10-CM | POA: Diagnosis not present

## 2022-10-22 DIAGNOSIS — I5033 Acute on chronic diastolic (congestive) heart failure: Secondary | ICD-10-CM | POA: Diagnosis not present

## 2022-10-22 DIAGNOSIS — I110 Hypertensive heart disease with heart failure: Secondary | ICD-10-CM | POA: Diagnosis not present

## 2022-10-22 DIAGNOSIS — Z9181 History of falling: Secondary | ICD-10-CM | POA: Diagnosis not present

## 2022-10-22 DIAGNOSIS — Z556 Problems related to health literacy: Secondary | ICD-10-CM | POA: Diagnosis not present

## 2022-10-22 DIAGNOSIS — Z604 Social exclusion and rejection: Secondary | ICD-10-CM | POA: Diagnosis not present

## 2022-10-22 DIAGNOSIS — E781 Pure hyperglyceridemia: Secondary | ICD-10-CM | POA: Diagnosis not present

## 2022-10-22 DIAGNOSIS — I872 Venous insufficiency (chronic) (peripheral): Secondary | ICD-10-CM | POA: Diagnosis not present

## 2022-10-22 DIAGNOSIS — M549 Dorsalgia, unspecified: Secondary | ICD-10-CM | POA: Diagnosis not present

## 2022-10-22 DIAGNOSIS — G4733 Obstructive sleep apnea (adult) (pediatric): Secondary | ICD-10-CM | POA: Diagnosis not present

## 2022-10-22 DIAGNOSIS — Z7982 Long term (current) use of aspirin: Secondary | ICD-10-CM | POA: Diagnosis not present

## 2022-10-22 DIAGNOSIS — Z87891 Personal history of nicotine dependence: Secondary | ICD-10-CM | POA: Diagnosis not present

## 2022-10-22 DIAGNOSIS — H9190 Unspecified hearing loss, unspecified ear: Secondary | ICD-10-CM | POA: Diagnosis not present

## 2022-10-22 DIAGNOSIS — Z791 Long term (current) use of non-steroidal anti-inflammatories (NSAID): Secondary | ICD-10-CM | POA: Diagnosis not present

## 2022-10-22 DIAGNOSIS — Z792 Long term (current) use of antibiotics: Secondary | ICD-10-CM | POA: Diagnosis not present

## 2022-10-22 DIAGNOSIS — Z7951 Long term (current) use of inhaled steroids: Secondary | ICD-10-CM | POA: Diagnosis not present

## 2022-10-22 DIAGNOSIS — R001 Bradycardia, unspecified: Secondary | ICD-10-CM | POA: Diagnosis not present

## 2022-10-22 DIAGNOSIS — J9611 Chronic respiratory failure with hypoxia: Secondary | ICD-10-CM | POA: Diagnosis not present

## 2022-10-22 DIAGNOSIS — G629 Polyneuropathy, unspecified: Secondary | ICD-10-CM | POA: Diagnosis not present

## 2022-10-22 DIAGNOSIS — G8929 Other chronic pain: Secondary | ICD-10-CM | POA: Diagnosis not present

## 2022-10-24 DIAGNOSIS — I10 Essential (primary) hypertension: Secondary | ICD-10-CM | POA: Diagnosis not present

## 2022-10-24 DIAGNOSIS — E7849 Other hyperlipidemia: Secondary | ICD-10-CM | POA: Diagnosis not present

## 2022-10-24 DIAGNOSIS — R7301 Impaired fasting glucose: Secondary | ICD-10-CM | POA: Diagnosis not present

## 2022-10-26 DIAGNOSIS — J9610 Chronic respiratory failure, unspecified whether with hypoxia or hypercapnia: Secondary | ICD-10-CM | POA: Diagnosis not present

## 2022-10-27 DIAGNOSIS — R079 Chest pain, unspecified: Secondary | ICD-10-CM | POA: Diagnosis not present

## 2022-10-27 DIAGNOSIS — R609 Edema, unspecified: Secondary | ICD-10-CM | POA: Diagnosis not present

## 2022-10-27 DIAGNOSIS — J45909 Unspecified asthma, uncomplicated: Secondary | ICD-10-CM | POA: Diagnosis not present

## 2022-10-27 DIAGNOSIS — R7301 Impaired fasting glucose: Secondary | ICD-10-CM | POA: Diagnosis not present

## 2022-10-27 DIAGNOSIS — M170 Bilateral primary osteoarthritis of knee: Secondary | ICD-10-CM | POA: Diagnosis not present

## 2022-10-27 DIAGNOSIS — R42 Dizziness and giddiness: Secondary | ICD-10-CM | POA: Diagnosis not present

## 2022-10-27 DIAGNOSIS — R030 Elevated blood-pressure reading, without diagnosis of hypertension: Secondary | ICD-10-CM | POA: Diagnosis not present

## 2022-10-27 DIAGNOSIS — E7849 Other hyperlipidemia: Secondary | ICD-10-CM | POA: Diagnosis not present

## 2022-10-27 DIAGNOSIS — G629 Polyneuropathy, unspecified: Secondary | ICD-10-CM | POA: Diagnosis not present

## 2022-10-27 DIAGNOSIS — H9190 Unspecified hearing loss, unspecified ear: Secondary | ICD-10-CM | POA: Diagnosis not present

## 2022-10-28 ENCOUNTER — Ambulatory Visit: Payer: Medicare Other | Admitting: Nurse Practitioner

## 2022-10-28 NOTE — Progress Notes (Deleted)
Office Visit    Patient Name: Jenna Sherman Date of Encounter: 09/15/2022  PCP:  Estanislado Pandy, MD   [facility]  Cardiologist:  Dina Rich, MD  Advanced Practice Provider:  No care team member to display Electrophysiologist:  None   Chief Complaint    Jenna Sherman is a 72 y.o. female with a mild CAD, chronic diastolic CHF, hx of hyperlipidemia, HTN, family hx of CVD, hx of chest pain, COPD, morbid obesity, and hx of vertigo, peripheral neuropathy, and hx of uterine cancer, who presents today for hospital follow-up.    Past Medical History    Past Medical History:  Diagnosis Date   Asthma    Chronic back pain    Depression    History of uterine cancer    History of vertigo    Hypertension    Normal LVF (EF 65%) by 2D Echo, August 2005. Mild left ventricular hypertrophy; mild mitral regurgitation   Hypertriglyceridemia    Morbid obesity (HCC)    Peripheral neuropathy    Past Surgical History:  Procedure Laterality Date   CARPAL TUNNEL RELEASE  07/30/2006   REPLACEMENT TOTAL KNEE Left    RIGHT/LEFT HEART CATH AND CORONARY ANGIOGRAPHY N/A 09/03/2022   Procedure: RIGHT/LEFT HEART CATH AND CORONARY ANGIOGRAPHY;  Surgeon: Corky Crafts, MD;  Location: [facility] INVASIVE CV LAB;  Service: Cardiovascular;  Laterality: N/A;   TOTAL ABDOMINAL HYSTERECTOMY W/ BILATERAL SALPINGOOPHORECTOMY     uterine cancer    Allergies  Allergies  Allergen Reactions   Ivp Dye [Iodinated Contrast Media] Shortness Of Breath    History of Present Illness    Jenna Sherman is a very pleasant 72 y.o. female with a PMH as mentioned above.   Last seen by Dr. Dina Rich on 06/11/2022.  Reported chest pain along mid chest at that office visit, stated lasted about 1-1/2 minutes, spread to her shoulders.  Associated with rest or activity, worse with walking and admitted to shortness of breath with her symptoms.  Her chest pain had progressed since last office visit.  Called back wanting to proceed with left heart cath.   Saw Dr. Vassie Loll on Aug 26, 2022.  Was arranged sleep study for OSA evaluation.  D-dimer elevated, Dr. Vassie Loll recommended proceeding with VQ scan as she cannot tolerate CT angio of chest due to history of IV contrast allergy.  He also recommended that patient undergo left and right heart cath.  Saw her 09/01/22 for follow-up with her husband.  Poor historian due to [facility].  History comes from husband who stated her chest pain was getting worse.  Patient noted  chest pain at rest and with exertion.  Still continued to have panic attacks and anxiety.  Husband stated feet and legs appear to look better recently in regards to edema. Was sent to ED for further evaluation.   Hospital admission 6/3 - 6/6 for chest pain, VQ scan without PE and lower extremity venous Dopplers negative for DVT.  Underwent cardiac cath at [facility], found to have mild to moderate nonobstructive CAD.  Limited echo revealed EF 60 to 65%, no RWMA, grade 1 DD.  Was given IV Lasix for acute on chronic diastolic heart failure, treated for UTI.  Today she presents for hospital follow-up.  Husband and patient states that she is feeling much better since discharge.  Patient admits to occasional, nonsevere twinges in her chest, denies any anginal chest pain.  Currently wearing 2 L of oxygen.  Denies any chest pain, shortness of breath, palpitations, syncope, presyncope, dizziness, orthopnea, PND, significant weight changes, acute bleeding, or claudication.  Swelling in bilateral lower extremities has improved.  EKGs/Labs/Other Studies Reviewed:   The following studies were reviewed today:   EKG:  EKG is not ordered today.  EKG dated July 21, 2022 revealed NSR, 60 bpm, incomplete RBBB, nonspecific ST wave changes, no acute ischemic changes.   Limited echo 09/03/2022: 1. Left ventricular ejection fraction, by estimation, is 60 to 65%. The  left ventricle has normal function. The left ventricle  has no regional  wall motion abnormalities. The left ventricular internal cavity size was  mildly dilated. Left ventricular  diastolic parameters are consistent with Grade I diastolic dysfunction  (impaired relaxation).   2. Right ventricular systolic function is normal. The right ventricular  size is normal. There is normal pulmonary artery systolic pressure. The  estimated right ventricular systolic pressure is 24.3 mmHg.   3. The mitral valve is normal in structure. No evidence of mitral valve  regurgitation. No evidence of mitral stenosis.   4. The aortic valve was not well visualized. Aortic valve regurgitation  is not visualized. No aortic stenosis is present.   5. The inferior vena cava is normal in size with greater than 50%  respiratory variability, suggesting right atrial pressure of 3 mmHg.   Right and left heart cath on 09/03/2022:   LV end diastolic pressure is normal.  LVEDP 12 mm Hg.   There is no aortic valve stenosis.   Recommend Aspirin 81mg  daily for moderate CAD.   Mild, calcific nonobstructive CAD.   Aortic saturation 94%, PA saturation 68%, mean RA pressure 5 mmHg, PA pressure 32/15, mean PA pressure 24 mmHg, cardiac output 5.89 L/min, cardiac index 2.92.   Continue preventive therapy.    Tortuosity in the right subclavian and rotation of the heart make catheter engagement difficult.  AL-1 catheter needed to engage the RCA.  If repeat catheterization was needed in the future, would consider right femoral approach.  Echo 09/2021: 1. Left ventricular ejection fraction, by estimation, is 70 to 75%. The  left ventricle has hyperdynamic function. Left ventricular endocardial  border not optimally defined to evaluate regional wall motion. Left  ventricular diastolic parameters are  indeterminate. Elevated left atrial pressure. The average left ventricular  global longitudinal strain is -21.0 %. The global longitudinal strain is normal.   2. Right ventricular systolic  function is normal. The right ventricular size is normal.   3. The mitral valve is normal in structure. No evidence of mitral valve regurgitation. No evidence of mitral stenosis.   4. The aortic valve was not well visualized. There is mild calcification of the aortic valve. There is mild thickening of the aortic valve. Aortic valve regurgitation is not visualized. No aortic stenosis is present.   Comparison(s): Echocardiogram done 06/15/13 showed an EF of 60-65%.   Recent Labs: 09/01/2022: ALT 29 09/04/2022: B Natriuretic Peptide 70.9; BUN 33; Creatinine, Ser 1.07; Hemoglobin 12.1; Platelets 120; Potassium 3.9; Sodium 131  Recent Lipid Panel    Component Value Date/Time   CHOL 154 09/03/2022 0427   TRIG 162 (H) 09/03/2022 0427   HDL 35 (L) 09/03/2022 0427   CHOLHDL 4.4 09/03/2022 0427   VLDL 32 09/03/2022 0427   LDLCALC 87 09/03/2022 0427     Home Medications   Current Meds  Medication Sig   albuterol (PROVENTIL) (2.5 MG/3ML) 0.083% nebulizer solution Take 2.5 mg by nebulization every 4 (four) hours as  needed for wheezing or shortness of breath.   aspirin EC 81 MG tablet Take 1 tablet (81 mg total) by mouth daily. Swallow whole.   atorvastatin (LIPITOR) 40 MG tablet Take 1 tablet (40 mg total) by mouth daily.   cetirizine (ZYRTEC) 10 MG tablet Take 1 tablet by mouth daily.   clonazePAM (KLONOPIN) 0.5 MG tablet Take 0.5 mg by mouth every 8 (eight) hours as needed for anxiety.   cyclobenzaprine (FLEXERIL) 10 MG tablet Take 15 mg by mouth at bedtime.   diclofenac (VOLTAREN) 75 MG EC tablet Take 1 tablet (75 mg total) by mouth every 12 (twelve) hours as needed.   escitalopram (LEXAPRO) 20 MG tablet Take 20 mg by mouth at bedtime.   fluticasone furoate-vilanterol (BREO ELLIPTA) 200-25 MCG/ACT AEPB Inhale 1 puff into the lungs daily.   furosemide (LASIX) 40 MG tablet Take 1 tablet (40 mg total) by mouth daily. Start taking from tomorrow   gabapentin (NEURONTIN) 300 MG capsule Take 600 mg by  mouth at bedtime.   meclizine (ANTIVERT) 25 MG tablet Take 25 mg by mouth 3 (three) times daily as needed for dizziness.   metoprolol succinate (TOPROL-XL) 50 MG 24 hr tablet Take 50 mg by mouth daily.   nitroGLYCERIN (NITROSTAT) 0.4 MG SL tablet Place 1 tablet (0.4 mg total) under the tongue every 5 (five) minutes x 3 doses as needed for chest pain (if no relief after 3rd dose, proceed to ED for evalution or call 911).   NYAMYC powder SMARTSIG:5 Gram(s) Topical 4 Times Daily   nystatin (MYCOSTATIN/NYSTOP) powder Apply topically 2 (two) times daily. Apply on abdominal folds   spironolactone (ALDACTONE) 25 MG tablet Take 1 tablet (25 mg total) by mouth daily.   Review of Systems    All other systems reviewed and are otherwise negative except as noted above.  Physical Exam    VS:  There were no vitals taken for this visit. , BMI There is no height or weight on file to calculate BMI.  Wt Readings from Last 3 Encounters:  09/15/22 272 lb (123.4 kg)  09/04/22 267 lb 13.7 oz (121.5 kg)  09/01/22 274 lb 6.4 oz (124.5 kg)    Repeat BP: 125/81  GEN: Morbidly obese, 72 y.o. female, wearing chronic O2 HEENT: normal. Neck: Supple, no JVD, carotid bruits, or masses. Cardiac: S1/S2, RRR, no murmurs, rubs, or gallops. No clubbing, cyanosis.  Non pitting edema along BLE. Radials2+ /PT 1+ and equal bilaterally.  Respiratory:  Respirations regular and labored, clear to auscultation bilaterally. MS: No deformity or atrophy. Generalized weakness.  Skin: Warm and dry, no rash, continued skin discoloration along lower extremities, scattered bruising noted to bilateral forearms. Neuro:  Strength and sensation are intact. Psych: Normal affect.  Assessment & Plan    1.  CAD, medication management Stable with no anginal symptoms.  Mild to moderate calcific nonobstructive CAD noted during recent cardiac catheterization.  Continue aspirin, atorvastatin, Toprol-XL, and nitroglycerin as needed.  Heart healthy  diet encouraged.  Will obtain CBC and BMET in 2 weeks per protocol.  At next office visit, we will plan to obtain FLP and LFTs as she is now on atorvastatin 40 mg daily.  2.  Chronic diastolic CHF Stage C, NYHA class II-III symptoms.  Says she is feeling much better since hospital discharge.  Limited echo revealed normal EF.  Weight is stable. Euvolemic and well compensated on exam.  Continue Lasix, metoprolol succinate, and spironolactone.  She is not a candidate for SGLT2 inhibitor  as she is currently being treated for UTI. Low sodium diet, fluid restriction <2L, and daily weights encouraged. Educated to contact our office for weight gain of 2 lbs overnight or 5 lbs in one week.  Obtaining labs as mentioned above.  Last BNP normal.  3.  Hyperlipidemia Last lipid panel revealed LDL at 87, triglycerides at 162, HDL 35, rest of panel WNL.  She is currently on atorvastatin 40 mg daily.  We will continue current medication regimen.  At next office visit, plan to obtain FLP and LFT per protocol.  Heart healthy diet encouraged.  4.  Hypertension Blood pressure on arrival 158/76, repeat BP by me 125/81.  Continue current medication regimen.  Healthy diet encouraged.  Obtaining labs as mentioned above.  5.  COPD Currently wearing 2 L of oxygen continuously per husband's report.  Denies any recent symptoms.  No change in medication regimen.  Continue to follow-up with Dr. Vassie Loll as scheduled.  6.  UTI Still currently being treated for this per husband's report.  No change in medication regimen.  Continue to follow-up with Dr. Neita Carp.  7.  Generalized weakness Continue to work with home health therapy.  Disposition: Follow up in 6-8 weeks with Dina Rich, MD or APP.  Signed, Sharlene Dory, NP 10/28/2022, 8:12 AM [facility]

## 2022-11-04 DIAGNOSIS — I5033 Acute on chronic diastolic (congestive) heart failure: Secondary | ICD-10-CM | POA: Diagnosis not present

## 2022-11-04 DIAGNOSIS — R531 Weakness: Secondary | ICD-10-CM | POA: Diagnosis not present

## 2022-11-11 ENCOUNTER — Ambulatory Visit (HOSPITAL_COMMUNITY): Payer: Medicare Other

## 2022-11-19 DIAGNOSIS — I110 Hypertensive heart disease with heart failure: Secondary | ICD-10-CM | POA: Diagnosis not present

## 2022-11-19 DIAGNOSIS — Z792 Long term (current) use of antibiotics: Secondary | ICD-10-CM | POA: Diagnosis not present

## 2022-11-19 DIAGNOSIS — G629 Polyneuropathy, unspecified: Secondary | ICD-10-CM | POA: Diagnosis not present

## 2022-11-19 DIAGNOSIS — F32A Depression, unspecified: Secondary | ICD-10-CM | POA: Diagnosis not present

## 2022-11-19 DIAGNOSIS — Z556 Problems related to health literacy: Secondary | ICD-10-CM | POA: Diagnosis not present

## 2022-11-19 DIAGNOSIS — E781 Pure hyperglyceridemia: Secondary | ICD-10-CM | POA: Diagnosis not present

## 2022-11-19 DIAGNOSIS — R001 Bradycardia, unspecified: Secondary | ICD-10-CM | POA: Diagnosis not present

## 2022-11-19 DIAGNOSIS — M549 Dorsalgia, unspecified: Secondary | ICD-10-CM | POA: Diagnosis not present

## 2022-11-19 DIAGNOSIS — H9190 Unspecified hearing loss, unspecified ear: Secondary | ICD-10-CM | POA: Diagnosis not present

## 2022-11-19 DIAGNOSIS — I872 Venous insufficiency (chronic) (peripheral): Secondary | ICD-10-CM | POA: Diagnosis not present

## 2022-11-19 DIAGNOSIS — Z604 Social exclusion and rejection: Secondary | ICD-10-CM | POA: Diagnosis not present

## 2022-11-19 DIAGNOSIS — G8929 Other chronic pain: Secondary | ICD-10-CM | POA: Diagnosis not present

## 2022-11-19 DIAGNOSIS — I5033 Acute on chronic diastolic (congestive) heart failure: Secondary | ICD-10-CM | POA: Diagnosis not present

## 2022-11-19 DIAGNOSIS — Z791 Long term (current) use of non-steroidal anti-inflammatories (NSAID): Secondary | ICD-10-CM | POA: Diagnosis not present

## 2022-11-19 DIAGNOSIS — G4733 Obstructive sleep apnea (adult) (pediatric): Secondary | ICD-10-CM | POA: Diagnosis not present

## 2022-11-19 DIAGNOSIS — J4489 Other specified chronic obstructive pulmonary disease: Secondary | ICD-10-CM | POA: Diagnosis not present

## 2022-11-19 DIAGNOSIS — Z7982 Long term (current) use of aspirin: Secondary | ICD-10-CM | POA: Diagnosis not present

## 2022-11-19 DIAGNOSIS — J9611 Chronic respiratory failure with hypoxia: Secondary | ICD-10-CM | POA: Diagnosis not present

## 2022-11-19 DIAGNOSIS — Z87891 Personal history of nicotine dependence: Secondary | ICD-10-CM | POA: Diagnosis not present

## 2022-11-19 DIAGNOSIS — Z9181 History of falling: Secondary | ICD-10-CM | POA: Diagnosis not present

## 2022-11-19 DIAGNOSIS — N390 Urinary tract infection, site not specified: Secondary | ICD-10-CM | POA: Diagnosis not present

## 2022-11-19 DIAGNOSIS — Z7951 Long term (current) use of inhaled steroids: Secondary | ICD-10-CM | POA: Diagnosis not present

## 2022-11-21 DIAGNOSIS — Z556 Problems related to health literacy: Secondary | ICD-10-CM | POA: Diagnosis not present

## 2022-11-21 DIAGNOSIS — I5033 Acute on chronic diastolic (congestive) heart failure: Secondary | ICD-10-CM | POA: Diagnosis not present

## 2022-11-21 DIAGNOSIS — Z87891 Personal history of nicotine dependence: Secondary | ICD-10-CM | POA: Diagnosis not present

## 2022-11-21 DIAGNOSIS — M549 Dorsalgia, unspecified: Secondary | ICD-10-CM | POA: Diagnosis not present

## 2022-11-21 DIAGNOSIS — Z9181 History of falling: Secondary | ICD-10-CM | POA: Diagnosis not present

## 2022-11-21 DIAGNOSIS — R001 Bradycardia, unspecified: Secondary | ICD-10-CM | POA: Diagnosis not present

## 2022-11-21 DIAGNOSIS — Z792 Long term (current) use of antibiotics: Secondary | ICD-10-CM | POA: Diagnosis not present

## 2022-11-21 DIAGNOSIS — G629 Polyneuropathy, unspecified: Secondary | ICD-10-CM | POA: Diagnosis not present

## 2022-11-21 DIAGNOSIS — F32A Depression, unspecified: Secondary | ICD-10-CM | POA: Diagnosis not present

## 2022-11-21 DIAGNOSIS — J4489 Other specified chronic obstructive pulmonary disease: Secondary | ICD-10-CM | POA: Diagnosis not present

## 2022-11-21 DIAGNOSIS — Z604 Social exclusion and rejection: Secondary | ICD-10-CM | POA: Diagnosis not present

## 2022-11-21 DIAGNOSIS — N390 Urinary tract infection, site not specified: Secondary | ICD-10-CM | POA: Diagnosis not present

## 2022-11-21 DIAGNOSIS — G8929 Other chronic pain: Secondary | ICD-10-CM | POA: Diagnosis not present

## 2022-11-21 DIAGNOSIS — Z7951 Long term (current) use of inhaled steroids: Secondary | ICD-10-CM | POA: Diagnosis not present

## 2022-11-21 DIAGNOSIS — E781 Pure hyperglyceridemia: Secondary | ICD-10-CM | POA: Diagnosis not present

## 2022-11-21 DIAGNOSIS — H9190 Unspecified hearing loss, unspecified ear: Secondary | ICD-10-CM | POA: Diagnosis not present

## 2022-11-21 DIAGNOSIS — I872 Venous insufficiency (chronic) (peripheral): Secondary | ICD-10-CM | POA: Diagnosis not present

## 2022-11-21 DIAGNOSIS — I110 Hypertensive heart disease with heart failure: Secondary | ICD-10-CM | POA: Diagnosis not present

## 2022-11-21 DIAGNOSIS — G4733 Obstructive sleep apnea (adult) (pediatric): Secondary | ICD-10-CM | POA: Diagnosis not present

## 2022-11-21 DIAGNOSIS — J9611 Chronic respiratory failure with hypoxia: Secondary | ICD-10-CM | POA: Diagnosis not present

## 2022-11-21 DIAGNOSIS — Z791 Long term (current) use of non-steroidal anti-inflammatories (NSAID): Secondary | ICD-10-CM | POA: Diagnosis not present

## 2022-11-21 DIAGNOSIS — Z7982 Long term (current) use of aspirin: Secondary | ICD-10-CM | POA: Diagnosis not present

## 2022-11-26 DIAGNOSIS — J9610 Chronic respiratory failure, unspecified whether with hypoxia or hypercapnia: Secondary | ICD-10-CM | POA: Diagnosis not present

## 2023-01-07 DIAGNOSIS — R300 Dysuria: Secondary | ICD-10-CM | POA: Diagnosis not present

## 2023-01-07 DIAGNOSIS — J45901 Unspecified asthma with (acute) exacerbation: Secondary | ICD-10-CM | POA: Diagnosis not present

## 2023-01-07 DIAGNOSIS — R319 Hematuria, unspecified: Secondary | ICD-10-CM | POA: Diagnosis not present

## 2023-01-07 DIAGNOSIS — R030 Elevated blood-pressure reading, without diagnosis of hypertension: Secondary | ICD-10-CM | POA: Diagnosis not present

## 2023-01-13 DIAGNOSIS — R5383 Other fatigue: Secondary | ICD-10-CM | POA: Diagnosis not present

## 2023-01-13 DIAGNOSIS — R7301 Impaired fasting glucose: Secondary | ICD-10-CM | POA: Diagnosis not present

## 2023-01-13 DIAGNOSIS — E7849 Other hyperlipidemia: Secondary | ICD-10-CM | POA: Diagnosis not present

## 2023-01-20 DIAGNOSIS — R079 Chest pain, unspecified: Secondary | ICD-10-CM | POA: Diagnosis not present

## 2023-01-20 DIAGNOSIS — E7849 Other hyperlipidemia: Secondary | ICD-10-CM | POA: Diagnosis not present

## 2023-01-20 DIAGNOSIS — G629 Polyneuropathy, unspecified: Secondary | ICD-10-CM | POA: Diagnosis not present

## 2023-01-20 DIAGNOSIS — Z0001 Encounter for general adult medical examination with abnormal findings: Secondary | ICD-10-CM | POA: Diagnosis not present

## 2023-01-20 DIAGNOSIS — J45909 Unspecified asthma, uncomplicated: Secondary | ICD-10-CM | POA: Diagnosis not present

## 2023-01-20 DIAGNOSIS — R609 Edema, unspecified: Secondary | ICD-10-CM | POA: Diagnosis not present

## 2023-01-20 DIAGNOSIS — R42 Dizziness and giddiness: Secondary | ICD-10-CM | POA: Diagnosis not present

## 2023-01-20 DIAGNOSIS — Z23 Encounter for immunization: Secondary | ICD-10-CM | POA: Diagnosis not present

## 2023-01-20 DIAGNOSIS — R030 Elevated blood-pressure reading, without diagnosis of hypertension: Secondary | ICD-10-CM | POA: Diagnosis not present

## 2023-01-20 DIAGNOSIS — Z1389 Encounter for screening for other disorder: Secondary | ICD-10-CM | POA: Diagnosis not present

## 2023-05-18 DIAGNOSIS — R5383 Other fatigue: Secondary | ICD-10-CM | POA: Diagnosis not present

## 2023-05-18 DIAGNOSIS — I10 Essential (primary) hypertension: Secondary | ICD-10-CM | POA: Diagnosis not present

## 2023-05-18 DIAGNOSIS — E7849 Other hyperlipidemia: Secondary | ICD-10-CM | POA: Diagnosis not present

## 2023-05-18 DIAGNOSIS — K2100 Gastro-esophageal reflux disease with esophagitis, without bleeding: Secondary | ICD-10-CM | POA: Diagnosis not present

## 2023-05-27 DIAGNOSIS — M170 Bilateral primary osteoarthritis of knee: Secondary | ICD-10-CM | POA: Diagnosis not present

## 2023-05-27 DIAGNOSIS — Z23 Encounter for immunization: Secondary | ICD-10-CM | POA: Diagnosis not present

## 2023-05-27 DIAGNOSIS — E782 Mixed hyperlipidemia: Secondary | ICD-10-CM | POA: Diagnosis not present

## 2023-05-27 DIAGNOSIS — E7849 Other hyperlipidemia: Secondary | ICD-10-CM | POA: Diagnosis not present

## 2023-09-21 DIAGNOSIS — E7800 Pure hypercholesterolemia, unspecified: Secondary | ICD-10-CM | POA: Diagnosis not present

## 2023-09-21 DIAGNOSIS — R7301 Impaired fasting glucose: Secondary | ICD-10-CM | POA: Diagnosis not present

## 2023-09-21 DIAGNOSIS — I10 Essential (primary) hypertension: Secondary | ICD-10-CM | POA: Diagnosis not present

## 2023-09-21 DIAGNOSIS — E7849 Other hyperlipidemia: Secondary | ICD-10-CM | POA: Diagnosis not present

## 2023-09-23 DIAGNOSIS — I10 Essential (primary) hypertension: Secondary | ICD-10-CM | POA: Diagnosis not present

## 2023-09-23 DIAGNOSIS — M170 Bilateral primary osteoarthritis of knee: Secondary | ICD-10-CM | POA: Diagnosis not present

## 2023-09-23 DIAGNOSIS — Z23 Encounter for immunization: Secondary | ICD-10-CM | POA: Diagnosis not present

## 2023-09-23 DIAGNOSIS — K2100 Gastro-esophageal reflux disease with esophagitis, without bleeding: Secondary | ICD-10-CM | POA: Diagnosis not present

## 2023-10-30 DIAGNOSIS — I10 Essential (primary) hypertension: Secondary | ICD-10-CM | POA: Diagnosis not present

## 2023-10-30 DIAGNOSIS — J449 Chronic obstructive pulmonary disease, unspecified: Secondary | ICD-10-CM | POA: Diagnosis not present

## 2023-10-30 DIAGNOSIS — E782 Mixed hyperlipidemia: Secondary | ICD-10-CM | POA: Diagnosis not present

## 2023-10-30 DIAGNOSIS — I5032 Chronic diastolic (congestive) heart failure: Secondary | ICD-10-CM | POA: Diagnosis not present

## 2023-11-30 DIAGNOSIS — E782 Mixed hyperlipidemia: Secondary | ICD-10-CM | POA: Diagnosis not present

## 2023-11-30 DIAGNOSIS — J449 Chronic obstructive pulmonary disease, unspecified: Secondary | ICD-10-CM | POA: Diagnosis not present

## 2023-11-30 DIAGNOSIS — I10 Essential (primary) hypertension: Secondary | ICD-10-CM | POA: Diagnosis not present

## 2023-11-30 DIAGNOSIS — I5032 Chronic diastolic (congestive) heart failure: Secondary | ICD-10-CM | POA: Diagnosis not present

## 2023-12-07 DIAGNOSIS — I5032 Chronic diastolic (congestive) heart failure: Secondary | ICD-10-CM | POA: Diagnosis not present

## 2023-12-07 DIAGNOSIS — G4733 Obstructive sleep apnea (adult) (pediatric): Secondary | ICD-10-CM | POA: Diagnosis not present

## 2023-12-07 DIAGNOSIS — Z7409 Other reduced mobility: Secondary | ICD-10-CM | POA: Diagnosis not present

## 2023-12-07 DIAGNOSIS — I10 Essential (primary) hypertension: Secondary | ICD-10-CM | POA: Diagnosis not present

## 2023-12-07 DIAGNOSIS — J309 Allergic rhinitis, unspecified: Secondary | ICD-10-CM | POA: Diagnosis not present

## 2023-12-07 DIAGNOSIS — I2510 Atherosclerotic heart disease of native coronary artery without angina pectoris: Secondary | ICD-10-CM | POA: Diagnosis not present

## 2023-12-07 DIAGNOSIS — E782 Mixed hyperlipidemia: Secondary | ICD-10-CM | POA: Diagnosis not present

## 2023-12-07 DIAGNOSIS — K2100 Gastro-esophageal reflux disease with esophagitis, without bleeding: Secondary | ICD-10-CM | POA: Diagnosis not present

## 2023-12-07 DIAGNOSIS — J449 Chronic obstructive pulmonary disease, unspecified: Secondary | ICD-10-CM | POA: Diagnosis not present

## 2023-12-07 DIAGNOSIS — M170 Bilateral primary osteoarthritis of knee: Secondary | ICD-10-CM | POA: Diagnosis not present

## 2023-12-10 DIAGNOSIS — J449 Chronic obstructive pulmonary disease, unspecified: Secondary | ICD-10-CM | POA: Diagnosis not present

## 2023-12-10 DIAGNOSIS — I5032 Chronic diastolic (congestive) heart failure: Secondary | ICD-10-CM | POA: Diagnosis not present

## 2023-12-10 DIAGNOSIS — I110 Hypertensive heart disease with heart failure: Secondary | ICD-10-CM | POA: Diagnosis not present

## 2023-12-15 DIAGNOSIS — I110 Hypertensive heart disease with heart failure: Secondary | ICD-10-CM | POA: Diagnosis not present

## 2023-12-17 ENCOUNTER — Inpatient Hospital Stay (HOSPITAL_COMMUNITY)
Admission: EM | Admit: 2023-12-17 | Discharge: 2023-12-21 | DRG: 291 | Disposition: A | Discharge status: Home Health | Attending: Internal Medicine | Admitting: Internal Medicine

## 2023-12-17 ENCOUNTER — Other Ambulatory Visit: Payer: Self-pay

## 2023-12-17 ENCOUNTER — Encounter (HOSPITAL_COMMUNITY): Payer: Self-pay

## 2023-12-17 ENCOUNTER — Emergency Department (HOSPITAL_COMMUNITY)

## 2023-12-17 DIAGNOSIS — F419 Anxiety disorder, unspecified: Secondary | ICD-10-CM | POA: Diagnosis present

## 2023-12-17 DIAGNOSIS — I2510 Atherosclerotic heart disease of native coronary artery without angina pectoris: Secondary | ICD-10-CM | POA: Diagnosis present

## 2023-12-17 DIAGNOSIS — I5032 Chronic diastolic (congestive) heart failure: Secondary | ICD-10-CM | POA: Diagnosis not present

## 2023-12-17 DIAGNOSIS — I10 Essential (primary) hypertension: Secondary | ICD-10-CM | POA: Diagnosis present

## 2023-12-17 DIAGNOSIS — N3000 Acute cystitis without hematuria: Secondary | ICD-10-CM | POA: Diagnosis not present

## 2023-12-17 DIAGNOSIS — Z8249 Family history of ischemic heart disease and other diseases of the circulatory system: Secondary | ICD-10-CM

## 2023-12-17 DIAGNOSIS — Z9981 Dependence on supplemental oxygen: Secondary | ICD-10-CM

## 2023-12-17 DIAGNOSIS — M7989 Other specified soft tissue disorders: Secondary | ICD-10-CM | POA: Diagnosis not present

## 2023-12-17 DIAGNOSIS — I5031 Acute diastolic (congestive) heart failure: Secondary | ICD-10-CM | POA: Diagnosis not present

## 2023-12-17 DIAGNOSIS — Z90710 Acquired absence of both cervix and uterus: Secondary | ICD-10-CM

## 2023-12-17 DIAGNOSIS — R918 Other nonspecific abnormal finding of lung field: Secondary | ICD-10-CM | POA: Diagnosis not present

## 2023-12-17 DIAGNOSIS — Z1152 Encounter for screening for COVID-19: Secondary | ICD-10-CM

## 2023-12-17 DIAGNOSIS — R4182 Altered mental status, unspecified: Principal | ICD-10-CM

## 2023-12-17 DIAGNOSIS — I480 Paroxysmal atrial fibrillation: Secondary | ICD-10-CM | POA: Diagnosis not present

## 2023-12-17 DIAGNOSIS — J9601 Acute respiratory failure with hypoxia: Secondary | ICD-10-CM

## 2023-12-17 DIAGNOSIS — N390 Urinary tract infection, site not specified: Secondary | ICD-10-CM | POA: Diagnosis not present

## 2023-12-17 DIAGNOSIS — I5033 Acute on chronic diastolic (congestive) heart failure: Secondary | ICD-10-CM | POA: Diagnosis present

## 2023-12-17 DIAGNOSIS — R001 Bradycardia, unspecified: Secondary | ICD-10-CM | POA: Diagnosis not present

## 2023-12-17 DIAGNOSIS — J811 Chronic pulmonary edema: Secondary | ICD-10-CM | POA: Diagnosis not present

## 2023-12-17 DIAGNOSIS — I5043 Acute on chronic combined systolic (congestive) and diastolic (congestive) heart failure: Secondary | ICD-10-CM | POA: Diagnosis not present

## 2023-12-17 DIAGNOSIS — R410 Disorientation, unspecified: Secondary | ICD-10-CM | POA: Diagnosis not present

## 2023-12-17 DIAGNOSIS — G9341 Metabolic encephalopathy: Secondary | ICD-10-CM | POA: Diagnosis not present

## 2023-12-17 DIAGNOSIS — I509 Heart failure, unspecified: Secondary | ICD-10-CM

## 2023-12-17 DIAGNOSIS — R197 Diarrhea, unspecified: Secondary | ICD-10-CM | POA: Diagnosis present

## 2023-12-17 DIAGNOSIS — Z7982 Long term (current) use of aspirin: Secondary | ICD-10-CM | POA: Diagnosis not present

## 2023-12-17 DIAGNOSIS — G4733 Obstructive sleep apnea (adult) (pediatric): Secondary | ICD-10-CM | POA: Diagnosis not present

## 2023-12-17 DIAGNOSIS — I5023 Acute on chronic systolic (congestive) heart failure: Secondary | ICD-10-CM | POA: Diagnosis not present

## 2023-12-17 DIAGNOSIS — I4892 Unspecified atrial flutter: Secondary | ICD-10-CM | POA: Diagnosis present

## 2023-12-17 DIAGNOSIS — Z7951 Long term (current) use of inhaled steroids: Secondary | ICD-10-CM

## 2023-12-17 DIAGNOSIS — I4891 Unspecified atrial fibrillation: Secondary | ICD-10-CM

## 2023-12-17 DIAGNOSIS — J4489 Other specified chronic obstructive pulmonary disease: Secondary | ICD-10-CM | POA: Diagnosis not present

## 2023-12-17 DIAGNOSIS — E782 Mixed hyperlipidemia: Secondary | ICD-10-CM | POA: Diagnosis not present

## 2023-12-17 DIAGNOSIS — Z79899 Other long term (current) drug therapy: Secondary | ICD-10-CM | POA: Diagnosis not present

## 2023-12-17 DIAGNOSIS — N3001 Acute cystitis with hematuria: Secondary | ICD-10-CM | POA: Diagnosis not present

## 2023-12-17 DIAGNOSIS — I110 Hypertensive heart disease with heart failure: Secondary | ICD-10-CM | POA: Diagnosis not present

## 2023-12-17 DIAGNOSIS — D696 Thrombocytopenia, unspecified: Secondary | ICD-10-CM | POA: Diagnosis present

## 2023-12-17 DIAGNOSIS — Z87891 Personal history of nicotine dependence: Secondary | ICD-10-CM

## 2023-12-17 DIAGNOSIS — Z6841 Body Mass Index (BMI) 40.0 and over, adult: Secondary | ICD-10-CM

## 2023-12-17 DIAGNOSIS — J9612 Chronic respiratory failure with hypercapnia: Secondary | ICD-10-CM | POA: Diagnosis present

## 2023-12-17 DIAGNOSIS — H9190 Unspecified hearing loss, unspecified ear: Secondary | ICD-10-CM | POA: Diagnosis present

## 2023-12-17 DIAGNOSIS — K5900 Constipation, unspecified: Secondary | ICD-10-CM | POA: Diagnosis present

## 2023-12-17 DIAGNOSIS — Z91199 Patient's noncompliance with other medical treatment and regimen due to unspecified reason: Secondary | ICD-10-CM

## 2023-12-17 DIAGNOSIS — Z8542 Personal history of malignant neoplasm of other parts of uterus: Secondary | ICD-10-CM

## 2023-12-17 DIAGNOSIS — E876 Hypokalemia: Secondary | ICD-10-CM | POA: Diagnosis not present

## 2023-12-17 DIAGNOSIS — J9611 Chronic respiratory failure with hypoxia: Secondary | ICD-10-CM | POA: Diagnosis not present

## 2023-12-17 DIAGNOSIS — Z96652 Presence of left artificial knee joint: Secondary | ICD-10-CM | POA: Diagnosis present

## 2023-12-17 DIAGNOSIS — J449 Chronic obstructive pulmonary disease, unspecified: Secondary | ICD-10-CM | POA: Diagnosis not present

## 2023-12-17 DIAGNOSIS — Z7901 Long term (current) use of anticoagulants: Secondary | ICD-10-CM

## 2023-12-17 DIAGNOSIS — Z91041 Radiographic dye allergy status: Secondary | ICD-10-CM

## 2023-12-17 HISTORY — DX: Heart failure, unspecified: I50.9

## 2023-12-17 LAB — I-STAT CHEM 8, ED
BUN: 13 mg/dL (ref 8–23)
Calcium, Ion: 1.03 mmol/L — ABNORMAL LOW (ref 1.15–1.40)
Chloride: 94 mmol/L — ABNORMAL LOW (ref 98–111)
Creatinine, Ser: 0.8 mg/dL (ref 0.44–1.00)
Glucose, Bld: 93 mg/dL (ref 70–99)
HCT: 41 % (ref 36.0–46.0)
Hemoglobin: 13.9 g/dL (ref 12.0–15.0)
Potassium: 4.2 mmol/L (ref 3.5–5.1)
Sodium: 139 mmol/L (ref 135–145)
TCO2: 37 mmol/L — ABNORMAL HIGH (ref 22–32)

## 2023-12-17 LAB — BLOOD GAS, VENOUS
Acid-Base Excess: 18.8 mmol/L — ABNORMAL HIGH (ref 0.0–2.0)
Bicarbonate: 47.4 mmol/L — ABNORMAL HIGH (ref 20.0–28.0)
Drawn by: 7049
O2 Saturation: 57.8 %
Patient temperature: 36.7
pCO2, Ven: 72 mmHg (ref 44–60)
pH, Ven: 7.42 (ref 7.25–7.43)
pO2, Ven: 34 mmHg (ref 32–45)

## 2023-12-17 LAB — CBC WITH DIFFERENTIAL/PLATELET
Abs Immature Granulocytes: 0.01 K/uL (ref 0.00–0.07)
Basophils Absolute: 0 K/uL (ref 0.0–0.1)
Basophils Relative: 1 %
Eosinophils Absolute: 0.1 K/uL (ref 0.0–0.5)
Eosinophils Relative: 2 %
HCT: 42.1 % (ref 36.0–46.0)
Hemoglobin: 13.8 g/dL (ref 12.0–15.0)
Immature Granulocytes: 0 %
Lymphocytes Relative: 28 %
Lymphs Abs: 1.3 K/uL (ref 0.7–4.0)
MCH: 32.5 pg (ref 26.0–34.0)
MCHC: 32.8 g/dL (ref 30.0–36.0)
MCV: 99.1 fL (ref 80.0–100.0)
Monocytes Absolute: 0.4 K/uL (ref 0.1–1.0)
Monocytes Relative: 8 %
Neutro Abs: 2.8 K/uL (ref 1.7–7.7)
Neutrophils Relative %: 61 %
Platelets: 111 K/uL — ABNORMAL LOW (ref 150–400)
RBC: 4.25 MIL/uL (ref 3.87–5.11)
RDW: 15.3 % (ref 11.5–15.5)
WBC: 4.7 K/uL (ref 4.0–10.5)
nRBC: 0 % (ref 0.0–0.2)

## 2023-12-17 LAB — RESP PANEL BY RT-PCR (RSV, FLU A&B, COVID)  RVPGX2
Influenza A by PCR: NEGATIVE
Influenza B by PCR: NEGATIVE
Resp Syncytial Virus by PCR: NEGATIVE
SARS Coronavirus 2 by RT PCR: NEGATIVE

## 2023-12-17 LAB — URINALYSIS, ROUTINE W REFLEX MICROSCOPIC
Bilirubin Urine: NEGATIVE
Glucose, UA: NEGATIVE mg/dL
Ketones, ur: NEGATIVE mg/dL
Nitrite: POSITIVE — AB
Protein, ur: NEGATIVE mg/dL
Specific Gravity, Urine: 1.005 (ref 1.005–1.030)
WBC, UA: >50 WBC/hpf (ref 0–5)
pH: 7 (ref 5.0–8.0)

## 2023-12-17 LAB — MAGNESIUM: Magnesium: 2 mg/dL (ref 1.7–2.4)

## 2023-12-17 LAB — AMMONIA: Ammonia: 18 umol/L (ref 9–35)

## 2023-12-17 LAB — TROPONIN I (HIGH SENSITIVITY)
Troponin I (High Sensitivity): 5 ng/L (ref <18)
Troponin I (High Sensitivity): 5 ng/L (ref <18)

## 2023-12-17 LAB — COMPREHENSIVE METABOLIC PANEL WITH GFR
ALT: 18 U/L (ref 0–44)
AST: 25 U/L (ref 15–41)
Albumin: 3.4 g/dL — ABNORMAL LOW (ref 3.5–5.0)
Alkaline Phosphatase: 50 U/L (ref 38–126)
Anion gap: 13 (ref 5–15)
BUN: 13 mg/dL (ref 8–23)
CO2: 32 mmol/L (ref 22–32)
Calcium: 8.7 mg/dL — ABNORMAL LOW (ref 8.9–10.3)
Chloride: 95 mmol/L — ABNORMAL LOW (ref 98–111)
Creatinine, Ser: 0.65 mg/dL (ref 0.44–1.00)
GFR, Estimated: >60 mL/min (ref >60)
Glucose, Bld: 93 mg/dL (ref 70–99)
Potassium: 4.2 mmol/L (ref 3.5–5.1)
Sodium: 140 mmol/L (ref 135–145)
Total Bilirubin: 1.8 mg/dL — ABNORMAL HIGH (ref 0.0–1.2)
Total Protein: 7.1 g/dL (ref 6.5–8.1)

## 2023-12-17 LAB — BRAIN NATRIURETIC PEPTIDE: B Natriuretic Peptide: 247 pg/mL — ABNORMAL HIGH (ref 0.0–100.0)

## 2023-12-17 LAB — TSH: TSH: 3.669 u[IU]/mL (ref 0.350–4.500)

## 2023-12-17 LAB — CBG MONITORING, ED: Glucose-Capillary: 91 mg/dL (ref 70–99)

## 2023-12-17 MED ORDER — ACETAMINOPHEN 325 MG PO TABS
650.0000 mg | ORAL_TABLET | Freq: Four times a day (QID) | ORAL | Status: DC | PRN
  Administered 2023-12-17 – 2023-12-18 (×2): 650 mg via ORAL
  Filled 2023-12-17 (×2): qty 2

## 2023-12-17 MED ORDER — GABAPENTIN 300 MG PO CAPS
600.0000 mg | ORAL_CAPSULE | Freq: Every day | ORAL | Status: DC
  Administered 2023-12-17 – 2023-12-20 (×4): 600 mg via ORAL
  Filled 2023-12-17 (×5): qty 2

## 2023-12-17 MED ORDER — SPIRONOLACTONE 25 MG PO TABS
25.0000 mg | ORAL_TABLET | Freq: Every day | ORAL | Status: DC
  Administered 2023-12-18 – 2023-12-21 (×4): 25 mg via ORAL
  Filled 2023-12-17 (×4): qty 1

## 2023-12-17 MED ORDER — IPRATROPIUM-ALBUTEROL 0.5-2.5 (3) MG/3ML IN SOLN
3.0000 mL | Freq: Once | RESPIRATORY_TRACT | Status: AC
  Administered 2023-12-17: 3 mL via RESPIRATORY_TRACT
  Filled 2023-12-17: qty 3

## 2023-12-17 MED ORDER — ONDANSETRON HCL 4 MG/2ML IJ SOLN: 4.0000 mg | Freq: Four times a day (QID) | INTRAMUSCULAR | Status: DC | PRN

## 2023-12-17 MED ORDER — FUROSEMIDE 10 MG/ML IJ SOLN
INTRAMUSCULAR | Status: AC
  Filled 2023-12-17: qty 4

## 2023-12-17 MED ORDER — ASPIRIN 81 MG PO TBEC
81.0000 mg | DELAYED_RELEASE_TABLET | Freq: Every day | ORAL | Status: DC
Start: 2023-12-18 — End: 2023-12-18
  Administered 2023-12-18: 81 mg via ORAL
  Filled 2023-12-17: qty 1

## 2023-12-17 MED ORDER — ENOXAPARIN SODIUM 60 MG/0.6ML IJ SOSY
60.0000 mg | PREFILLED_SYRINGE | INTRAMUSCULAR | Status: DC
  Administered 2023-12-17: 60 mg via SUBCUTANEOUS
  Filled 2023-12-17: qty 0.6

## 2023-12-17 MED ORDER — ESCITALOPRAM OXALATE 10 MG PO TABS
20.0000 mg | ORAL_TABLET | Freq: Every day | ORAL | Status: DC
  Administered 2023-12-17 – 2023-12-20 (×4): 20 mg via ORAL
  Filled 2023-12-17 (×5): qty 2

## 2023-12-17 MED ORDER — ACETAMINOPHEN 650 MG RE SUPP: 650.0000 mg | Freq: Four times a day (QID) | RECTAL | Status: DC | PRN

## 2023-12-17 MED ORDER — CLONAZEPAM 0.5 MG PO TABS
1.0000 mg | ORAL_TABLET | Freq: Every day | ORAL | Status: DC
  Administered 2023-12-18 – 2023-12-20 (×3): 1 mg via ORAL
  Filled 2023-12-17 (×4): qty 2

## 2023-12-17 MED ORDER — SODIUM CHLORIDE 0.9 % IV SOLN
2.0000 g | INTRAVENOUS | Status: DC
  Administered 2023-12-17 – 2023-12-19 (×3): 2 g via INTRAVENOUS
  Filled 2023-12-17 (×3): qty 20

## 2023-12-17 MED ORDER — POLYETHYLENE GLYCOL 3350 17 G PO PACK: 17.0000 g | PACK | Freq: Every day | ORAL | Status: DC | PRN

## 2023-12-17 MED ORDER — ATORVASTATIN CALCIUM 40 MG PO TABS
40.0000 mg | ORAL_TABLET | Freq: Every day | ORAL | Status: DC
  Administered 2023-12-18 – 2023-12-21 (×4): 40 mg via ORAL
  Filled 2023-12-17 (×4): qty 1

## 2023-12-17 MED ORDER — ONDANSETRON HCL 4 MG PO TABS: 4.0000 mg | ORAL_TABLET | Freq: Four times a day (QID) | ORAL | Status: DC | PRN

## 2023-12-17 MED ORDER — FUROSEMIDE 10 MG/ML IJ SOLN
80.0000 mg | Freq: Once | INTRAMUSCULAR | Status: AC
  Administered 2023-12-17: 80 mg via INTRAVENOUS
  Filled 2023-12-17: qty 8

## 2023-12-17 MED ORDER — FLUTICASONE FUROATE-VILANTEROL 200-25 MCG/ACT IN AEPB
1.0000 | INHALATION_SPRAY | Freq: Every day | RESPIRATORY_TRACT | Status: DC
  Administered 2023-12-18 – 2023-12-21 (×4): 1 via RESPIRATORY_TRACT
  Filled 2023-12-17: qty 28

## 2023-12-17 MED ORDER — FUROSEMIDE 10 MG/ML IJ SOLN
40.0000 mg | Freq: Two times a day (BID) | INTRAMUSCULAR | Status: AC
  Administered 2023-12-18 – 2023-12-20 (×6): 40 mg via INTRAVENOUS
  Filled 2023-12-17 (×6): qty 4

## 2023-12-17 MED ORDER — CLONAZEPAM 0.5 MG PO TABS
0.5000 mg | ORAL_TABLET | Freq: Every day | ORAL | Status: DC
Start: 2023-12-18 — End: 2023-12-21
  Administered 2023-12-18 – 2023-12-21 (×4): 0.5 mg via ORAL
  Filled 2023-12-17 (×4): qty 1

## 2023-12-17 NOTE — Progress Notes (Signed)
 Patient family informed RT that she is supposed to wear a CPAP unit but due to be claustrophobic she has never worn one. Patient wears a Kayenta at bedtime at home.

## 2023-12-17 NOTE — ED Triage Notes (Signed)
 Pt arrived via POV from home after home health nurse advised her spouse to take her to the ER for further evaluation of hypoxia, bradycardia, and severe AMS. Pt non-ambulatory and unable to answer questions. EDP at bedside during Triage.

## 2023-12-17 NOTE — ED Notes (Signed)
 Admitting at bedside

## 2023-12-17 NOTE — H&P (Signed)
 History and Physical    Jenna Sherman FMW:[id number] DOB: 29-Aug-1950 DOA: 12/17/2023  PCP: Atilano Deward ORN, MD   Patient coming from: Home  I have personally briefly reviewed patient's old medical records in [facility]  Chief Complaint: Difficulty breathing  HPI: Jenna Sherman is a 73 y.o. female with medical history significant for diastolic CHF, hypertension, OSA. Patient presents altered mental status, difficulty breathing lower extremity swelling and weight gain.  Patient has been short of breath over the past 2 to 3 days, but confusion started yesterday.  Patient is supposed to be on 2 L of oxygen , but per spouse who patient lives with, patient barely uses it.  Is also been complaining of pain with urination and urinary frequency.   No fevers no chills. Since she has been getting O2 in the ED, mental status has significantly improved, but spouse and daughter reports some mild ongoing confusion.  ED Course: Tmax 99.  Heart rate 44-78.  Respiratory rate 13 -28.  Blood pressure systolic 99-170.  O2 sats 72% on room air, she was placed on 4 L sats currently greater than 93%. Troponin 5 BNP 247. VBG shows pH of 7.4, pCO2 of 72.  Serum bicarb on BMP 32. Chest x-ray shows cardiomegaly, pulmonary edema, and likely small-mod right pleural effusion. Head CT negative for acute abnormality. Initial EKG shows atrial fibrillation, later patient converted to sinus bradycardia IV Lasix  80 mg x 1 given.  DuoNebs given.  Review of Systems: As per HPI all other systems reviewed and negative.  Past Medical History:  Diagnosis Date   Asthma    CHF (congestive heart failure) (HCC)    Chronic back pain    Depression    History of uterine cancer    History of vertigo    Hypertension    Normal LVF (EF 65%) by 2D Echo, August 2005. Mild left ventricular hypertrophy; mild mitral regurgitation   Hypertriglyceridemia    Morbid obesity (HCC)    Peripheral neuropathy     Past Surgical History:   Procedure Laterality Date   CARPAL TUNNEL RELEASE  07/30/2006   REPLACEMENT TOTAL KNEE Left    RIGHT/LEFT HEART CATH AND CORONARY ANGIOGRAPHY N/A 09/03/2022   Procedure: RIGHT/LEFT HEART CATH AND CORONARY ANGIOGRAPHY;  Surgeon: Dann Candyce RAMAN, MD;  Location: [facility] INVASIVE CV LAB;  Service: Cardiovascular;  Laterality: N/A;   TOTAL ABDOMINAL HYSTERECTOMY W/ BILATERAL SALPINGOOPHORECTOMY     uterine cancer     reports that she quit smoking about 55 years ago. Her smoking use included cigarettes. She started smoking about 56 years ago. She has never been exposed to tobacco smoke. She has never used smokeless tobacco. She reports that she does not currently use alcohol. She reports that she does not currently use drugs.  Allergies  Allergen Reactions   Ivp Dye [Iodinated Contrast Media] Shortness Of Breath    Family History  Problem Relation Age of Onset   Heart attack Brother 59   Heart attack Father 64   CAD Brother    CAD Father    Heart disease Sister        x's 2    Prior to Admission medications   Medication Sig Start Date End Date Taking? Authorizing Provider  aspirin  EC 81 MG tablet Take 1 tablet (81 mg total) by mouth daily. Swallow whole. 09/05/22  Yes Jillian Buttery, MD  atorvastatin  (LIPITOR) 40 MG tablet Take 1 tablet (40 mg total) by mouth daily. 09/05/22  Yes Adhikari, Amrit,  MD  cetirizine (ZYRTEC) 10 MG tablet Take 1 tablet by mouth daily.   Yes [provider]  clonazePAM  (KLONOPIN ) 0.5 MG tablet Take 0.5 mg by mouth 2 (two) times daily. 0.5 mg am, 1 mg pm   Yes [provider]  diclofenac  (VOLTAREN ) 75 MG EC tablet Take 1 tablet (75 mg total) by mouth every 12 (twelve) hours as needed. Patient taking differently: Take 75 mg by mouth every 12 (twelve) hours as needed for mild pain (pain score 1-3). 09/04/22  Yes Adhikari, Ivonne, MD  escitalopram  (LEXAPRO ) 20 MG tablet Take 20 mg by mouth at bedtime. 08/29/21  Yes [provider]  fluticasone   furoate-vilanterol (BREO ELLIPTA ) 200-25 MCG/ACT AEPB Inhale 1 puff into the lungs daily.   Yes [provider]  furosemide  (LASIX ) 40 MG tablet Take 1 tablet (40 mg total) by mouth daily. Start taking from tomorrow 09/04/22  Yes Jillian Ivonne, MD  gabapentin  (NEURONTIN ) 300 MG capsule Take 600 mg by mouth at bedtime.   Yes [provider]  meclizine (ANTIVERT) 25 MG tablet Take 25 mg by mouth 3 (three) times daily as needed for dizziness.   Yes [provider]  metoprolol  succinate (TOPROL -XL) 50 MG 24 hr tablet Take 50 mg by mouth daily. 09/17/21  Yes [provider]  MYRBETRIQ 25 MG TB24 tablet Take 25 mg by mouth daily. 12/07/23  Yes [provider]  nitroGLYCERIN  (NITROSTAT ) 0.4 MG SL tablet Place 1 tablet (0.4 mg total) under the tongue every 5 (five) minutes x 3 doses as needed for chest pain (if no relief after 3rd dose, proceed to ED for evalution or call 911). 07/21/22  Yes Miriam Norris, NP  NYAMYC  powder Apply 1 Application topically. 05/13/22  Yes [provider]  spironolactone  (ALDACTONE ) 25 MG tablet Take 1 tablet (25 mg total) by mouth daily. 09/05/22  Yes Jillian Ivonne, MD  nystatin  (MYCOSTATIN /NYSTOP ) powder Apply topically 2 (two) times daily. Apply on abdominal folds 09/04/22   Jillian Ivonne, MD    Physical Exam: Vitals:   12/17/23 1618 12/17/23 1700 12/17/23 1715 12/17/23 1730  BP:  (!) 115/55 119/63 (!) 114/59  Pulse:  92 66 68  Resp:  (!) 21 19 19   Temp:    98.3 F (36.8 C)  TempSrc:    Oral  SpO2: 96% 93% 96% 96%  Weight:      Height:        Constitutional: Patient is very hard of hearing, calm, comfortable Vitals:   12/17/23 1618 12/17/23 1700 12/17/23 1715 12/17/23 1730  BP:  (!) 115/55 119/63 (!) 114/59  Pulse:  92 66 68  Resp:  (!) 21 19 19   Temp:    98.3 F (36.8 C)  TempSrc:    Oral  SpO2: 96% 93% 96% 96%  Weight:      Height:       Eyes: PERRL, lids and conjunctivae normal ENMT: Mucous membranes  are moist.  Neck: normal, supple, no masses, no thyromegaly Respiratory: clear to auscultation bilaterally, no wheezing,  Normal respiratory effort. No accessory muscle use.  Cardiovascular: Bradycardic, regular rate and rhythm, no murmurs / rubs / gallops.  Trace to 1+ pitting bilateral lower no extremity edema, with chronic stasis changes.  Abdomen: no tenderness, no masses palpated. No hepatosplenomegaly. Bowel sounds positive.  Musculoskeletal: No joint deformity upper and lower extremities.  Purplish discoloration to bilateral feet-but has improved being on O2, family reports this is significantly worse at home. Distal feet are cool to  touch, DP pulses palpable bilaterally. Skin: no rashes, lesions, ulcers. No induration Neurologic: Hard of hearing, no facial asymmetry, moving extremities spontaneously, speech fluent. Psychiatric: Normal judgment and insight. Alert and oriented x 3. Normal mood.   Labs on Admission: I have personally reviewed following labs and imaging studies  CBC: Recent Labs  Lab 12/17/23 1529 12/17/23 1548  WBC 4.7  --   NEUTROABS 2.8  --   HGB 13.8 13.9  HCT 42.1 41.0  MCV 99.1  --   PLT 111*  --    Basic Metabolic Panel: Recent Labs  Lab 12/17/23 1529 12/17/23 1548  NA 140 139  K 4.2 4.2  CL 95* 94*  CO2 32  --   GLUCOSE 93 93  BUN 13 13  CREATININE 0.65 0.80  CALCIUM  8.7*  --    GFR: Estimated Creatinine Clearance: 73.7 mL/min (by C-G formula based on SCr of 0.8 mg/dL). Liver Function Tests: Recent Labs  Lab 12/17/23 1529  AST 25  ALT 18  ALKPHOS 50  BILITOT 1.8*  PROT 7.1  ALBUMIN  3.4*   No results for input(s): LIPASE, AMYLASE in the last 168 hours. Recent Labs  Lab 12/17/23 1619  AMMONIA 18   CBG: Recent Labs  Lab 12/17/23 1518  GLUCAP 91   Urine analysis:    Component Value Date/Time   COLORURINE YELLOW 12/17/2023 1710   APPEARANCEUR HAZY (A) 12/17/2023 1710   LABSPEC 1.005 12/17/2023 1710   PHURINE 7.0  12/17/2023 1710   GLUCOSEU NEGATIVE 12/17/2023 1710   HGBUR MODERATE (A) 12/17/2023 1710   BILIRUBINUR NEGATIVE 12/17/2023 1710   KETONESUR NEGATIVE 12/17/2023 1710   PROTEINUR NEGATIVE 12/17/2023 1710   UROBILINOGEN 1.0 04/10/2010 1637   NITRITE POSITIVE (A) 12/17/2023 1710   LEUKOCYTESUR LARGE (A) 12/17/2023 1710    Radiological Exams on Admission: CT Head Wo Contrast Result Date: 12/17/2023 CLINICAL DATA:  Hypoxia, bradycardia, altered level of consciousness, delirium EXAM: CT HEAD WITHOUT CONTRAST TECHNIQUE: Contiguous axial images were obtained from the base of the skull through the vertex without intravenous contrast. RADIATION DOSE REDUCTION: This exam was performed according to the departmental dose-optimization program which includes automated exposure control, adjustment of the mA and/or kV according to patient size and/or use of iterative reconstruction technique. COMPARISON:  None Available. FINDINGS: Brain: No acute infarct or hemorrhage. Lateral ventricles and midline structures are unremarkable. No acute extra-axial fluid collections. No mass effect. Vascular: No hyperdense vessel or unexpected calcification. Skull: Normal. Negative for fracture or focal lesion. Sinuses/Orbits: No acute finding. Other: None. IMPRESSION: 1. No acute intracranial process. Electronically Signed   By: Ozell Daring M.D.   On: 12/17/2023 16:25   DG Chest Port 1 View Result Date: 12/17/2023 CLINICAL DATA:  Bradycardia, altered mental status, and hypoxia EXAM: PORTABLE CHEST - 1 VIEW COMPARISON:  September 01, 2022 FINDINGS: Bilateral perihilar interstitial opacities. Hazy airspace opacities in both lung bases. Blunting of the right costophrenic sulcus. No pneumothorax. Moderate cardiomegaly. Tortuous aorta with aortic atherosclerosis. Multilevel thoracic osteophytosis. IMPRESSION: Moderate cardiomegaly with findings of pulmonary edema and likely small to moderate right pleural effusion. Electronically Signed    By: Rogelia Myers M.D.   On: 12/17/2023 15:57   EKG: Independently reviewed.  Atrial fibrillation, rate 73, QTc 460.  Subsequent EKG shows sinus bradycardia.-Rate 44.  No heart block.  Assessment/Plan Principal Problem:   Acute on chronic diastolic (congestive) heart failure (HCC) Active Problems:   Chronic respiratory failure with hypoxia (HCC)   Assessment and Plan: No notes have  been filed under this hospital service. Service: Hospitalist  Acute on chronic diastolic congestive heart failure-chest x-ray with pulmonary edema likely small to moderate right pleural effusion, BNP elevated at 247 from baseline.  Trace to 1+ pitting bilateral lower extremity edema.  Patient reports weight gain, a year ago, patient's discharge weight was 267, she is weighing 278 today.  Unsure of baseline weight.  Last echo 08/2022 EF of 60 to 65% with grade 1 DD. - IV Lasix  80 mg x 1 given in ED, continue 40 twice daily - Strict input output, daily weight, daily BMP - Obtain updated echocardiogram - Resume spironolactone  25 mg daily - Hold Metoprolol  with bradycardia  Acute metabolic encephalopathy-likely due to hypoxia and possibly UTI.  VBG shows CO2 of 72, but normal pH of 7.4, serum bicarb- borderline high- 32, suggesting chronic Co2retention.  Head CT negative for acute abnormality. - Diurese - IV Rocephin   Urinary tract infection-reports dysuria and urinary frequency over the past several days, rules out for sepsis.  WBC 4.2.  Tmax 99.  No recent urine cultures on file. - IV ceftriaxone  2 g daily - Add-on urine cultures  Transient atria Fibrillation/sinus bradycardia-initial EKG shows atrial fibrillation, no history of same, spontaneously converted to sinus bradycardia heart rate down to 44.  No heart block.  [place] Administrator, sports of 4- for age, HTN, CHF, gender. -Potassium 4.2. -Check TSH, magnesium - Follow-up echocardiogram - Hold metoprolol  with sinus bradycardia - Cardiology consult in a.m.  Chest  pain-with exertion.  In the setting of decompensated CHF, new paroxysmal atrial fibrillation and sinus bradycardia.  EKG without significant ST or T wave abnormalities.  Troponin 5. - Trend troponin - Resume aspirin , atorvastatin  40 mg daily  Chronic respiratory failure-on 2 L, patient has not been compliant with her O2, on arrival to the ED, O2 sats 72% on room air, she was placed on 5 L.  Purplish discoloration of bilateral feet likely from hypoxia, has improved.  Distal feet are cool, but DP pulses present. - Supplemental oxygen  - Bilateral lower extremity arterial Dopplers  Hypertension-systolic 99-170. - Hold metoprolol   OSA-CPAP nightly  DVT prophylaxis: Lovenox  Code Status: FULL Family Communication: Daughter and Spouse at bedside. Disposition Plan: > 2 days Consults called: Cardiology Admission status: Inpt tele I certify that at the point of admission it is my clinical judgment that the patient will require inpatient hospital care spanning beyond 2 midnights from the point of admission due to high intensity of service, high risk for further deterioration and high frequency of surveillance required.  CRITICAL CARE Performed by: Tully FORBES Carwin   Total critical care time: 60 minutes  Critical care time was exclusive of separately billable procedures and treating other patients.  Critical care was necessary to treat or prevent imminent or life-threatening deterioration.  Critical care was time spent personally by me on the following activities: development of treatment plan with patient and/or surrogate as well as nursing, discussions with consultants, evaluation of patient's response to treatment, examination of patient, obtaining history from patient or surrogate, ordering and performing treatments and interventions, ordering and review of laboratory studies, ordering and review of radiographic studies, pulse oximetry and re-evaluation of patient's  condition.   Author: Tully FORBES Carwin, MD 12/17/2023 7:32 PM  For on call review [email or web address].

## 2023-12-17 NOTE — ED Notes (Addendum)
 Critical results given to Dr. Elnor

## 2023-12-17 NOTE — ED Provider Notes (Signed)
 [facility] EMERGENCY DEPARTMENT AT [facility] Provider Note  CSN: [id number] Arrival date & time: 12/17/23 1516  Chief Complaint(s) Altered Mental Status  HPI Jenna Sherman is a 73 y.o. female with past medical history as below, significant for CHF, HTN, Obesity, asthma who presents to the ED with complaint of AMS, hypoxia  Patient unable to contribute much to history secondary to AMS.  History obtained primarily from spouse.  Patient has been having difficulty breathing over the past week, has precipitously worsened over the past 2 to 3 days.  She has been urinating frequently, variable compliance with her Lasix , variable compliance with her home oxygen . +weight gain over last week but pt unsure how much, legs +edema per spouse, she is on 3LNC at home but spouse struggles to keep it on her. She does not use BPAP or CPAP at bedtime, just her [place]. No fever, +chills, no travel, no sick contacts, no vomiting  Past Medical History Past Medical History:  Diagnosis Date   Asthma    CHF (congestive heart failure) (HCC)    Chronic back pain    Depression    History of uterine cancer    History of vertigo    Hypertension    Normal LVF (EF 65%) by 2D Echo, August 2005. Mild left ventricular hypertrophy; mild mitral regurgitation   Hypertriglyceridemia    Morbid obesity (HCC)    Peripheral neuropathy    Patient Active Problem List   Diagnosis Date Noted   Acute on chronic diastolic (congestive) heart failure (HCC) 09/01/2022   UTI (urinary tract infection) 09/01/2022   Chronic respiratory failure with hypoxia (HCC) 08/26/2022   Chronic diastolic heart failure (HCC) 08/26/2022   PURE HYPERCHOLESTEROLEMIA 08/08/2009   PURE HYPERGLYCERIDEMIA 08/08/2009   MORBID OBESITY 08/08/2009   SLEEP APNEA, OBSTRUCTIVE 08/08/2009   Essential hypertension 08/08/2009   Asthma 08/08/2009   EDEMA 08/08/2009   Chest pain 08/08/2009   PRECORDIAL PAIN 08/08/2009   Home Medication(s) Prior to  Admission medications   Medication Sig Start Date End Date Taking? Authorizing Provider  aspirin  EC 81 MG tablet Take 1 tablet (81 mg total) by mouth daily. Swallow whole. 09/05/22  Yes Jillian Buttery, MD  atorvastatin  (LIPITOR) 40 MG tablet Take 1 tablet (40 mg total) by mouth daily. 09/05/22  Yes Adhikari, Amrit, MD  cetirizine (ZYRTEC) 10 MG tablet Take 1 tablet by mouth daily.   Yes [provider]  clonazePAM  (KLONOPIN ) 0.5 MG tablet Take 0.5 mg by mouth 2 (two) times daily. 0.5 mg am, 1 mg pm   Yes [provider]  diclofenac  (VOLTAREN ) 75 MG EC tablet Take 1 tablet (75 mg total) by mouth every 12 (twelve) hours as needed. Patient taking differently: Take 75 mg by mouth every 12 (twelve) hours as needed for mild pain (pain score 1-3). 09/04/22  Yes Adhikari, Buttery, MD  escitalopram  (LEXAPRO ) 20 MG tablet Take 20 mg by mouth at bedtime. 08/29/21  Yes [provider]  fluticasone  furoate-vilanterol (BREO ELLIPTA ) 200-25 MCG/ACT AEPB Inhale 1 puff into the lungs daily.   Yes [provider]  furosemide  (LASIX ) 40 MG tablet Take 1 tablet (40 mg total) by mouth daily. Start taking from tomorrow 09/04/22  Yes Jillian Buttery, MD  gabapentin  (NEURONTIN ) 300 MG capsule Take 600 mg by mouth at bedtime.   Yes [provider]  meclizine (ANTIVERT) 25 MG tablet Take 25 mg by mouth 3 (three) times daily as needed for dizziness.   Yes [provider]  metoprolol  succinate (TOPROL -XL) 50 MG 24 hr tablet Take 50 mg by mouth daily. 09/17/21  Yes [provider]  MYRBETRIQ 25 MG TB24 tablet Take 25 mg by mouth daily. 12/07/23  Yes [provider]  nitroGLYCERIN  (NITROSTAT ) 0.4 MG SL tablet Place 1 tablet (0.4 mg total) under the tongue every 5 (five) minutes x 3 doses as needed for chest pain (if no relief after 3rd dose, proceed to ED for evalution or call 911). 07/21/22  Yes Miriam Norris, NP  NYAMYC  powder Apply 1 Application topically. 05/13/22  Yes  [provider]  spironolactone  (ALDACTONE ) 25 MG tablet Take 1 tablet (25 mg total) by mouth daily. 09/05/22  Yes Jillian Buttery, MD  nystatin  (MYCOSTATIN /NYSTOP ) powder Apply topically 2 (two) times daily. Apply on abdominal folds 09/04/22   Jillian Buttery, MD                                                                                                                                    Past Surgical History Past Surgical History:  Procedure Laterality Date   CARPAL TUNNEL RELEASE  07/30/2006   REPLACEMENT TOTAL KNEE Left    RIGHT/LEFT HEART CATH AND CORONARY ANGIOGRAPHY N/A 09/03/2022   Procedure: RIGHT/LEFT HEART CATH AND CORONARY ANGIOGRAPHY;  Surgeon: Dann Candyce RAMAN, MD;  Location: [facility] INVASIVE CV LAB;  Service: Cardiovascular;  Laterality: N/A;   TOTAL ABDOMINAL HYSTERECTOMY W/ BILATERAL SALPINGOOPHORECTOMY     uterine cancer   Family History Family History  Problem Relation Age of Onset   Heart attack Brother 31   Heart attack Father 39   CAD Brother    CAD Father    Heart disease Sister        x's 2    Social History Social History   Tobacco Use   Smoking status: Former    Current packs/day: 0.00    Types: Cigarettes    Start date: 04/01/1967    Quit date: 03/31/1968    Years since quitting: 55.7    Passive exposure: Never   Smokeless tobacco: Never  Vaping Use   Vaping status: Never Used  Substance Use Topics   Alcohol use: Not Currently   Drug use: Not Currently   Allergies Ivp dye [iodinated contrast media]  Review of Systems A thorough review of systems was obtained and all systems are negative except as noted in the HPI and PMH.   Physical Exam Vital Signs  I have reviewed the triage vital signs BP (!) 107/56 (BP Location: Right Arm)   Pulse (!) 44   Temp 98.3 F (36.8 C) (Oral)   Resp 20   Ht 4' 8 (1.422 m)   Wt 126.5 kg   SpO2 96%   BMI 62.53 kg/m  Physical Exam Vitals and nursing note reviewed.  Constitutional:      General:  She is in acute distress.     Appearance: Normal appearance. She is well-developed.  She is ill-appearing.  HENT:     Head: Normocephalic and atraumatic.     Right Ear: External ear normal.     Left Ear: External ear normal.     Nose: Nose normal.     Mouth/Throat:     Mouth: Mucous membranes are moist.  Eyes:     General: No scleral icterus.       Right eye: No discharge.        Left eye: No discharge.  Cardiovascular:     Rate and Rhythm: Tachycardia present.  Pulmonary:     Effort: Tachypnea and respiratory distress present.     Breath sounds: No stridor. Decreased breath sounds and rales present.     Comments: Coarse b/l  Abdominal:     General: Abdomen is flat. There is no distension.     Palpations: Abdomen is soft.     Tenderness: There is no guarding.  Musculoskeletal:        General: No deformity.     Cervical back: No rigidity.     Right lower leg: Edema present.     Left lower leg: Edema present.  Skin:    General: Skin is warm and dry.     Coloration: Skin is not cyanotic, jaundiced or pale.  Neurological:     Mental Status: She is alert. She is disoriented.     Cranial Nerves: No facial asymmetry.     Motor: No tremor.     Comments: Pt very hard of hearing Intermittently following commands  Moving extremities spontaneously Extremities and eyes will cross midline   Psychiatric:        Speech: Speech normal.        Behavior: Behavior normal. Behavior is cooperative.     ED Results and Treatments Labs (all labs ordered are listed, but only abnormal results are displayed) Labs Reviewed  BRAIN NATRIURETIC PEPTIDE - Abnormal; Notable for the following components:      Result Value   B Natriuretic Peptide 247.0 (*)    All other components within normal limits  CBC WITH DIFFERENTIAL/PLATELET - Abnormal; Notable for the following components:   Platelets 111 (*)    All other components within normal limits  COMPREHENSIVE METABOLIC PANEL WITH GFR - Abnormal;  Notable for the following components:   Chloride 95 (*)    Calcium  8.7 (*)    Albumin  3.4 (*)    Total Bilirubin 1.8 (*)    All other components within normal limits  BLOOD GAS, VENOUS - Abnormal; Notable for the following components:   pCO2, Ven 72 (*)    Bicarbonate 47.4 (*)    Acid-Base Excess 18.8 (*)    All other components within normal limits  I-STAT CHEM 8, ED - Abnormal; Notable for the following components:   Chloride 94 (*)    Calcium , Ion 1.03 (*)    TCO2 37 (*)    All other components within normal limits  RESP PANEL BY RT-PCR (RSV, FLU A&B, COVID)  RVPGX2  AMMONIA  URINALYSIS, ROUTINE W REFLEX MICROSCOPIC  CBG MONITORING, ED  TROPONIN I (HIGH SENSITIVITY)  TROPONIN I (HIGH SENSITIVITY)  Radiology CT Head Wo Contrast Result Date: 12/17/2023 CLINICAL DATA:  Hypoxia, bradycardia, altered level of consciousness, delirium EXAM: CT HEAD WITHOUT CONTRAST TECHNIQUE: Contiguous axial images were obtained from the base of the skull through the vertex without intravenous contrast. RADIATION DOSE REDUCTION: This exam was performed according to the departmental dose-optimization program which includes automated exposure control, adjustment of the mA and/or kV according to patient size and/or use of iterative reconstruction technique. COMPARISON:  None Available. FINDINGS: Brain: No acute infarct or hemorrhage. Lateral ventricles and midline structures are unremarkable. No acute extra-axial fluid collections. No mass effect. Vascular: No hyperdense vessel or unexpected calcification. Skull: Normal. Negative for fracture or focal lesion. Sinuses/Orbits: No acute finding. Other: None. IMPRESSION: 1. No acute intracranial process. Electronically Signed   By: Ozell Daring M.D.   On: 12/17/2023 16:25   DG Chest Port 1 View Result Date: 12/17/2023 CLINICAL DATA:   Bradycardia, altered mental status, and hypoxia EXAM: PORTABLE CHEST - 1 VIEW COMPARISON:  September 01, 2022 FINDINGS: Bilateral perihilar interstitial opacities. Hazy airspace opacities in both lung bases. Blunting of the right costophrenic sulcus. No pneumothorax. Moderate cardiomegaly. Tortuous aorta with aortic atherosclerosis. Multilevel thoracic osteophytosis. IMPRESSION: Moderate cardiomegaly with findings of pulmonary edema and likely small to moderate right pleural effusion. Electronically Signed   By: Rogelia Myers M.D.   On: 12/17/2023 15:57    Pertinent labs & imaging results that were available during my care of the patient were reviewed by me and considered in my medical decision making (see MDM for details).  Medications Ordered in ED Medications  ipratropium-albuterol  (DUONEB) 0.5-2.5 (3) MG/3ML nebulizer solution 3 mL (3 mLs Nebulization Given 12/17/23 1618)  furosemide  (LASIX ) injection 80 mg (0 mg Intravenous Hold 12/17/23 1751)                                                                                                                                     Procedures Procedures  (including critical care time)  Medical Decision Making / ED Course    Medical Decision Making:    KATISHA SHIMIZU is a 73 y.o. female with past medical history as below, significant for CHF, HTN, Obesity, asthma who presents to the ED with complaint of AMS, hypoxia. The complaint involves an extensive differential diagnosis and also carries with it a high risk of complications and morbidity.  Serious etiology was considered. Ddx includes but is not limited to: In my evaluation of this patient's dyspnea my DDx includes, but is not limited to, pneumonia, pulmonary embolism, pneumothorax, pulmonary edema, metabolic acidosis, asthma, COPD, cardiac cause, anemia, anxiety, etc.    Complete initial physical exam performed, notably the patient was in acute respiratory distress, hypoxia 70% on room air..     Reviewed and confirmed nursing documentation for past medical history, family history, social history.  Vital signs reviewed.    Acute on chronic hypoxic respiratory failure Respiratory distress Acute CHF  AMS > - Patient arrived hypoxic, acute respiratory distress, pulse ox and breathe mechanics improved with nasal cannula 6 L.  Patient is confused, follows command intermittently, she is very hard of hearing does not have hearing aids.  No focal neurodeficits, no dysarthria - Echocardiogram 6/24 with LVEF 60-65%, G1 DD - she appears overtly fluid overloaded, 3+ edema to LE b/l, rales on auscultation, pulm edema on CXR, start diuresis  - BNP is mildly elevated, likely impacted by her habitus - AMS likely multifactorial. She has hypercarbia, hypoxia, does not appear to have overt infection; mental status is improving w/ improved oxygenation, she is having good uop w/ lasix    Atrial flutter > - flutter noted on EKG, has since resolved and pt back in sinus, I do not see flutter/afib noted in her chart previously and this is not seen in prior tracing. Possible new onset flutter - returned to sinus once resp status improved    Admit for resp failure, acute CHF, AMS  Admit Dr Pearlean              Additional history obtained: -Additional history obtained from family -External records from outside source obtained and reviewed including: Chart review including previous notes, labs, imaging, consultation notes including  Prior labs, home medications, prior EKG, prior admission   Lab Tests: -I ordered, reviewed, and interpreted labs.   The pertinent results include:   Labs Reviewed  BRAIN NATRIURETIC PEPTIDE - Abnormal; Notable for the following components:      Result Value   B Natriuretic Peptide 247.0 (*)    All other components within normal limits  CBC WITH DIFFERENTIAL/PLATELET - Abnormal; Notable for the following components:   Platelets 111 (*)    All other components  within normal limits  COMPREHENSIVE METABOLIC PANEL WITH GFR - Abnormal; Notable for the following components:   Chloride 95 (*)    Calcium  8.7 (*)    Albumin  3.4 (*)    Total Bilirubin 1.8 (*)    All other components within normal limits  BLOOD GAS, VENOUS - Abnormal; Notable for the following components:   pCO2, Ven 72 (*)    Bicarbonate 47.4 (*)    Acid-Base Excess 18.8 (*)    All other components within normal limits  I-STAT CHEM 8, ED - Abnormal; Notable for the following components:   Chloride 94 (*)    Calcium , Ion 1.03 (*)    TCO2 37 (*)    All other components within normal limits  RESP PANEL BY RT-PCR (RSV, FLU A&B, COVID)  RVPGX2  AMMONIA  URINALYSIS, ROUTINE W REFLEX MICROSCOPIC  CBG MONITORING, ED  TROPONIN I (HIGH SENSITIVITY)  TROPONIN I (HIGH SENSITIVITY)    Notable for as above  EKG   EKG Interpretation Date/Time:  Thursday December 17 2023 17:41:35 EDT Ventricular Rate:  44 PR Interval:  179 QRS Duration:  102 QT Interval:  452 QTC Calculation: 387 R Axis:   -24  Text Interpretation: Sinus bradycardia Borderline left axis deviation Low voltage, precordial leads Abnormal R-wave progression, early transition Confirmed by Elnor Savant ([id number]) on 12/17/2023 5:55:30 PM         Imaging Studies ordered: I ordered imaging studies including CTH CXR I independently visualized the following imaging with scope of interpretation limited to determining acute life threatening conditions related to emergency care; findings noted above I agree with the radiologist interpretation If any imaging was obtained with contrast I closely monitored patient for any possible adverse reaction a/w contrast administration in  the emergency department   Medicines ordered and prescription drug management: Meds ordered this encounter  Medications   ipratropium-albuterol  (DUONEB) 0.5-2.5 (3) MG/3ML nebulizer solution 3 mL   furosemide  (LASIX ) injection 80 mg   furosemide  (LASIX ) 10  MG/ML injection    Ezequiel Millman J: cabinet override    -I have reviewed the patients home medicines and have made adjustments as needed   Consultations Obtained: I requested consultation with the [facility],  and discussed lab and imaging findings as well as pertinent plan    Cardiac Monitoring: The patient was maintained on a cardiac monitor.  I personally viewed and interpreted the cardiac monitored which showed an underlying rhythm of: afib Continuous pulse oximetry interpreted by myself, 72% on RA.    Social Determinants of Health:  Diagnosis or treatment significantly limited by social determinants of health: former smoker and obesity   Reevaluation: After the interventions noted above, I reevaluated the patient and found that they have improved  Co morbidities that complicate the patient evaluation  Past Medical History:  Diagnosis Date   Asthma    CHF (congestive heart failure) (HCC)    Chronic back pain    Depression    History of uterine cancer    History of vertigo    Hypertension    Normal LVF (EF 65%) by 2D Echo, August 2005. Mild left ventricular hypertrophy; mild mitral regurgitation   Hypertriglyceridemia    Morbid obesity (HCC)    Peripheral neuropathy       Dispostion: Disposition decision including need for hospitalization was considered, and patient admitted to the hospital.    Final Clinical Impression(s) / ED Diagnoses Final diagnoses:  Altered mental status, unspecified altered mental status type  Acute hypoxic respiratory failure (HCC)  Acute congestive heart failure, unspecified heart failure type (HCC)  Atrial flutter, unspecified type (HCC)        Elnor Savant A, DO 12/17/23 1800

## 2023-12-17 NOTE — ED Notes (Signed)
 [place], MD notified of pt heart dropping to 44.

## 2023-12-18 ENCOUNTER — Inpatient Hospital Stay (HOSPITAL_COMMUNITY)

## 2023-12-18 ENCOUNTER — Other Ambulatory Visit (HOSPITAL_COMMUNITY): Payer: Self-pay

## 2023-12-18 ENCOUNTER — Other Ambulatory Visit (HOSPITAL_COMMUNITY): Payer: Self-pay | Admitting: *Deleted

## 2023-12-18 DIAGNOSIS — I10 Essential (primary) hypertension: Secondary | ICD-10-CM | POA: Diagnosis not present

## 2023-12-18 DIAGNOSIS — I5023 Acute on chronic systolic (congestive) heart failure: Secondary | ICD-10-CM | POA: Diagnosis not present

## 2023-12-18 DIAGNOSIS — I5033 Acute on chronic diastolic (congestive) heart failure: Secondary | ICD-10-CM | POA: Diagnosis not present

## 2023-12-18 DIAGNOSIS — J9611 Chronic respiratory failure with hypoxia: Secondary | ICD-10-CM | POA: Diagnosis not present

## 2023-12-18 DIAGNOSIS — I480 Paroxysmal atrial fibrillation: Secondary | ICD-10-CM

## 2023-12-18 DIAGNOSIS — I5031 Acute diastolic (congestive) heart failure: Secondary | ICD-10-CM | POA: Diagnosis not present

## 2023-12-18 DIAGNOSIS — N3001 Acute cystitis with hematuria: Secondary | ICD-10-CM | POA: Diagnosis not present

## 2023-12-18 DIAGNOSIS — E876 Hypokalemia: Secondary | ICD-10-CM

## 2023-12-18 DIAGNOSIS — I2510 Atherosclerotic heart disease of native coronary artery without angina pectoris: Secondary | ICD-10-CM | POA: Diagnosis not present

## 2023-12-18 DIAGNOSIS — J9612 Chronic respiratory failure with hypercapnia: Secondary | ICD-10-CM

## 2023-12-18 LAB — VITAMIN B12: Vitamin B-12: 293 pg/mL (ref 180–914)

## 2023-12-18 LAB — ECHOCARDIOGRAM COMPLETE
AR max vel: 1.39 cm2
AV Area VTI: 1.88 cm2
AV Area mean vel: 1.41 cm2
AV Mean grad: 11 mmHg
AV Peak grad: 22.6 mmHg
Ao pk vel: 2.38 m/s
Area-P 1/2: 1.84 cm2
Height: 56 in
S' Lateral: 2.7 cm
Weight: 4292.8 [oz_av]

## 2023-12-18 LAB — BASIC METABOLIC PANEL WITH GFR
Anion gap: 11 (ref 5–15)
BUN: 14 mg/dL (ref 8–23)
CO2: 37 mmol/L — ABNORMAL HIGH (ref 22–32)
Calcium: 8.4 mg/dL — ABNORMAL LOW (ref 8.9–10.3)
Chloride: 93 mmol/L — ABNORMAL LOW (ref 98–111)
Creatinine, Ser: 0.71 mg/dL (ref 0.44–1.00)
GFR, Estimated: >60 mL/min (ref >60)
Glucose, Bld: 98 mg/dL (ref 70–99)
Potassium: 3.3 mmol/L — ABNORMAL LOW (ref 3.5–5.1)
Sodium: 141 mmol/L (ref 135–145)

## 2023-12-18 LAB — CBC
HCT: 42.7 % (ref 36.0–46.0)
Hemoglobin: 13.5 g/dL (ref 12.0–15.0)
MCH: 31.4 pg (ref 26.0–34.0)
MCHC: 31.6 g/dL (ref 30.0–36.0)
MCV: 99.3 fL (ref 80.0–100.0)
Platelets: 115 K/uL — ABNORMAL LOW (ref 150–400)
RBC: 4.3 MIL/uL (ref 3.87–5.11)
RDW: 15.1 % (ref 11.5–15.5)
WBC: 5 K/uL (ref 4.0–10.5)
nRBC: 0 % (ref 0.0–0.2)

## 2023-12-18 LAB — FOLATE: Folate: 7.6 ng/mL (ref >5.9)

## 2023-12-18 LAB — GLUCOSE, CAPILLARY
Glucose-Capillary: 103 mg/dL — ABNORMAL HIGH (ref 70–99)
Glucose-Capillary: 98 mg/dL (ref 70–99)

## 2023-12-18 MED ORDER — APIXABAN 5 MG PO TABS
5.0000 mg | ORAL_TABLET | Freq: Two times a day (BID) | ORAL | Status: DC
  Administered 2023-12-18 – 2023-12-21 (×7): 5 mg via ORAL
  Filled 2023-12-18 (×7): qty 1

## 2023-12-18 MED ORDER — POTASSIUM CHLORIDE CRYS ER 20 MEQ PO TBCR
40.0000 meq | EXTENDED_RELEASE_TABLET | Freq: Once | ORAL | Status: AC
  Administered 2023-12-18: 40 meq via ORAL
  Filled 2023-12-18: qty 2

## 2023-12-18 MED ORDER — HYDROCODONE-ACETAMINOPHEN 5-325 MG PO TABS
1.0000 | ORAL_TABLET | Freq: Four times a day (QID) | ORAL | Status: DC | PRN
  Administered 2023-12-18: 1 via ORAL
  Filled 2023-12-18: qty 1

## 2023-12-18 MED ORDER — PERFLUTREN LIPID MICROSPHERE
1.0000 mL | INTRAVENOUS | Status: AC | PRN
Start: 2023-12-18 — End: 2023-12-18
  Administered 2023-12-18: 3 mL via INTRAVENOUS

## 2023-12-18 NOTE — Progress Notes (Addendum)
 PROGRESS NOTE  Jenna Sherman FMW:[id number] DOB: [birth year] DOA: 12/17/2023 PCP: Atilano Deward ORN, MD  Brief History:  73 year old female with a history of hypertension, hyperlipidemia, HFpEF, hearing loss, OSA, chronic respiratory failure on 3 L, obesity presenting with altered mental status and worsening shortness of breath. The patient is a difficult historian secondary to her.  Impairment and some confusion.  History is supplemented by the patient's daughter at the bedside.  According to the patient's daughter, the patient has been having some intermittent confusion for the past 2 to 3 weeks, worsened over the past 2 to 3 days.  In addition, the patient has chronic dyspnea on exertion which is worse over the past 2 to 3 days.  There has been some increase in her lower extremity edema.  At baseline, the patient has chronic orthopnea.  There is been no change in her medications.  There is been no fever, chills, chest pain, nausea, vomiting.  There is been some loose stools.  There is no hematochezia, hematuria although there has been some dysuria. At baseline, the patient is able to transfer and get to the bedside commode and take a few steps.  She uses a walker.  Daughter states that the patient frequently takes her oxygen  off during the daytime even though she is supposed to be wearing it 24/7. In the ED, the patient had a low-grade temperature 99.1 F.  She was hemodynamically stable with oxygen  saturation in the 70s on room air.  She was placed on 4 L with saturation up to 99%.  WBC 5.0, hemoglobin 13.5, platelets 115.  BMP 247.  Troponin 5 >> 5.  Sodium 140, potassium 4.2, bicarbonate 32, serum creatinine 0.5.  LFTs unremarkable.  Chest x-ray showed vascular congestion.  Patient was started IV furosemide .  CT of the brain was negative for acute findings.  UA showed >50 WBC    Assessment/Plan: Acute on chronic HFpEF - 09/03/2022 echo EF 60-65%, grade 1 DD, normal RVF - Continue IV  furosemide  - Daily weights - Accurate I's and O's  Chronic respiratory failure with hypoxia and hypercarbia - 12/17/2023 VBG 7.40 8/72/34/47 - Stable on 3 L - COVID/RSV/Flu neg  New onset atrial fibrillation - Patient has converted back to sinus rhythm - Start apixaban  - CHADSVASc = 5 (CHF, HTN, Age, female, ASVD)  UTI -UA >50 WBC - Continue ceftriaxone  pending culture data  Acute metabolic encephalopathy - Secondary to infectious process and hypoxia - CT brain negative -UA>50 WBC  Diarrhea - Stool pathogen panel - Check C. Difficile  Thrombocytopenia - chronic dating back to 08/2021 -B12 -folate -TSH 3.669  Essential hypertension - Holding metoprolol  secondary to bradycardia - Reviewed telemetry and EKG--sinus pericardia, no high-grade AV block  Mixed hyperlipidemia - Continue statin  OSA - Patient does not use CPAP  Anxiety - Continue clonazepam  and Lexapro  - PDMP reviewed-- -clonazepam , 0.5 mg, #90, last refill 12/01/2023  Morbid obesity - BMI 60.15 - Lifestyle modification       Family Communication:   Daughter at bedside 9/19  Consultants:  none  Code Status:  FULL   DVT Prophylaxis:  apixaban    Procedures: As Listed in Progress Note Above  Antibiotics: Ceftriaxone  9/18>>     Subjective: Patient denies any headache, chest pain, shortness breath, abdominal pain.  Remainder review of systems unobtainable secondary to confusion.  Objective: Vitals:   12/17/23 1922 12/17/23 2015 12/18/23 0306 12/18/23 0500  BP: (!) 112/57  ROLLEN)  124/57   Pulse: (!) 43  (!) 41   Resp: 20  20   Temp: 98.1 F (36.7 C)  (!) 97.5 F (36.4 C)   TempSrc: Oral     SpO2: 98% 98% 99%   Weight:    121.7 kg  Height:        Intake/Output Summary (Last 24 hours) at 12/18/2023 0713 Last data filed at 12/18/2023 0514 Gross per 24 hour  Intake 240 ml  Output 1250 ml  Net -1010 ml   Weight change:  Exam:  General:  Pt is alert, follows commands  appropriately, not in acute distress HEENT: No icterus, No thrush, No neck mass, Benedict/AT Cardiovascular: RRR, S1/S2, no rubs, no gallops Respiratory: Bibasilar crackles.  No wheezing Abdomen: Soft/+BS, non tender, non distended, no guarding Extremities: trace LE edema, No lymphangitis, No petechiae, No rashes, no synovitis   Data Reviewed: I have personally reviewed following labs and imaging studies Basic Metabolic Panel: Recent Labs  Lab 12/17/23 1529 12/17/23 1548 12/17/23 1841 12/18/23 0301  NA 140 139  --  141  K 4.2 4.2  --  3.3*  CL 95* 94*  --  93*  CO2 32  --   --  37*  GLUCOSE 93 93  --  98  BUN 13 13  --  14  CREATININE 0.65 0.80  --  0.71  CALCIUM  8.7*  --   --  8.4*  MG  --   --  2.0  --    Liver Function Tests: Recent Labs  Lab 12/17/23 1529  AST 25  ALT 18  ALKPHOS 50  BILITOT 1.8*  PROT 7.1  ALBUMIN  3.4*   No results for input(s): LIPASE, AMYLASE in the last 168 hours. Recent Labs  Lab 12/17/23 1619  AMMONIA 18   Coagulation Profile: No results for input(s): INR, PROTIME in the last 168 hours. CBC: Recent Labs  Lab 12/17/23 1529 12/17/23 1548 12/18/23 0301  WBC 4.7  --  5.0  NEUTROABS 2.8  --   --   HGB 13.8 13.9 13.5  HCT 42.1 41.0 42.7  MCV 99.1  --  99.3  PLT 111*  --  115*   Cardiac Enzymes: No results for input(s): CKTOTAL, CKMB, CKMBINDEX, TROPONINI in the last 168 hours. BNP: Invalid input(s): POCBNP CBG: Recent Labs  Lab 12/17/23 1518 12/18/23 0026 12/18/23 0305  GLUCAP 91 103* 98   HbA1C: No results for input(s): HGBA1C in the last 72 hours. Urine analysis:    Component Value Date/Time   COLORURINE YELLOW 12/17/2023 1710   APPEARANCEUR HAZY (A) 12/17/2023 1710   LABSPEC 1.005 12/17/2023 1710   PHURINE 7.0 12/17/2023 1710   GLUCOSEU NEGATIVE 12/17/2023 1710   HGBUR MODERATE (A) 12/17/2023 1710   BILIRUBINUR NEGATIVE 12/17/2023 1710   KETONESUR NEGATIVE 12/17/2023 1710   PROTEINUR NEGATIVE  12/17/2023 1710   UROBILINOGEN 1.0 04/10/2010 1637   NITRITE POSITIVE (A) 12/17/2023 1710   LEUKOCYTESUR LARGE (A) 12/17/2023 1710   Sepsis Labs: @LABRCNTIP (procalcitonin:4,lacticidven:4) ) Recent Results (from the past 240 hours)  Resp panel by RT-PCR (RSV, Flu A&B, Covid) Anterior Nasal Swab     Status: None   Collection Time: 12/17/23  3:55 PM   Specimen: Anterior Nasal Swab  Result Value Ref Range Status   SARS Coronavirus 2 by RT PCR NEGATIVE NEGATIVE Final    Comment: (NOTE) SARS-CoV-2 target nucleic acids are NOT DETECTED.  The SARS-CoV-2 RNA is generally detectable in upper respiratory specimens during the acute phase of infection.  The lowest concentration of SARS-CoV-2 viral copies this assay can detect is 138 copies/mL. A negative result does not preclude SARS-Cov-2 infection and should not be used as the sole basis for treatment or other patient management decisions. A negative result may occur with  improper specimen collection/handling, submission of specimen other than nasopharyngeal swab, presence of viral mutation(s) within the areas targeted by this assay, and inadequate number of viral copies(<138 copies/mL). A negative result must be combined with clinical observations, patient history, and epidemiological information. The expected result is Negative.  Fact Sheet for Patients:  BloggerCourse.com  Fact Sheet for Healthcare Providers:  SeriousBroker.it  This test is no t yet approved or cleared by the [place]  FDA and  has been authorized for detection and/or diagnosis of SARS-CoV-2 by FDA under an Emergency Use Authorization (EUA). This EUA will remain  in effect (meaning this test can be used) for the duration of the COVID-19 declaration under Section 564(b)(1) of the Act, 21 U.S.C.section 360bbb-3(b)(1), unless the authorization is terminated  or revoked sooner.       Influenza A by PCR NEGATIVE  NEGATIVE Final   Influenza B by PCR NEGATIVE NEGATIVE Final    Comment: (NOTE) The Xpert Xpress SARS-CoV-2/FLU/RSV plus assay is intended as an aid in the diagnosis of influenza from Nasopharyngeal swab specimens and should not be used as a sole basis for treatment. Nasal washings and aspirates are unacceptable for Xpert Xpress SARS-CoV-2/FLU/RSV testing.  Fact Sheet for Patients: BloggerCourse.com  Fact Sheet for Healthcare Providers: SeriousBroker.it  This test is not yet approved or cleared by the [place]  FDA and has been authorized for detection and/or diagnosis of SARS-CoV-2 by FDA under an Emergency Use Authorization (EUA). This EUA will remain in effect (meaning this test can be used) for the duration of the COVID-19 declaration under Section 564(b)(1) of the Act, 21 U.S.C. section 360bbb-3(b)(1), unless the authorization is terminated or revoked.     Resp Syncytial Virus by PCR NEGATIVE NEGATIVE Final    Comment: (NOTE) Fact Sheet for Patients: BloggerCourse.com  Fact Sheet for Healthcare Providers: SeriousBroker.it  This test is not yet approved or cleared by the [place]  FDA and has been authorized for detection and/or diagnosis of SARS-CoV-2 by FDA under an Emergency Use Authorization (EUA). This EUA will remain in effect (meaning this test can be used) for the duration of the COVID-19 declaration under Section 564(b)(1) of the Act, 21 U.S.C. section 360bbb-3(b)(1), unless the authorization is terminated or revoked.  Performed at [facility], [address] [place], [place] [address]      Scheduled Meds:  aspirin  EC  81 mg Oral Daily   atorvastatin   40 mg Oral Daily   clonazePAM   0.5 mg Oral Daily   clonazePAM   1 mg Oral QHS   enoxaparin  (LOVENOX ) injection  60 mg Subcutaneous Q24H   escitalopram   20 mg Oral QHS   fluticasone  furoate-vilanterol  1  puff Inhalation Daily   furosemide   40 mg Intravenous Q12H   gabapentin   600 mg Oral QHS   spironolactone   25 mg Oral Daily   Continuous Infusions:  cefTRIAXone  (ROCEPHIN )  IV 2 g (12/17/23 2121)    Procedures/Studies: CT Head Wo Contrast Result Date: 12/17/2023 CLINICAL DATA:  Hypoxia, bradycardia, altered level of consciousness, delirium EXAM: CT HEAD WITHOUT CONTRAST TECHNIQUE: Contiguous axial images were obtained from the base of the skull through the vertex without intravenous contrast. RADIATION DOSE REDUCTION: This exam was performed according to the departmental dose-optimization program  which includes automated exposure control, adjustment of the mA and/or kV according to patient size and/or use of iterative reconstruction technique. COMPARISON:  None Available. FINDINGS: Brain: No acute infarct or hemorrhage. Lateral ventricles and midline structures are unremarkable. No acute extra-axial fluid collections. No mass effect. Vascular: No hyperdense vessel or unexpected calcification. Skull: Normal. Negative for fracture or focal lesion. Sinuses/Orbits: No acute finding. Other: None. IMPRESSION: 1. No acute intracranial process. Electronically Signed   By: Ozell Daring M.D.   On: 12/17/2023 16:25   DG Chest Port 1 View Result Date: 12/17/2023 CLINICAL DATA:  Bradycardia, altered mental status, and hypoxia EXAM: PORTABLE CHEST - 1 VIEW COMPARISON:  September 01, 2022 FINDINGS: Bilateral perihilar interstitial opacities. Hazy airspace opacities in both lung bases. Blunting of the right costophrenic sulcus. No pneumothorax. Moderate cardiomegaly. Tortuous aorta with aortic atherosclerosis. Multilevel thoracic osteophytosis. IMPRESSION: Moderate cardiomegaly with findings of pulmonary edema and likely small to moderate right pleural effusion. Electronically Signed   By: Rogelia Myers M.D.   On: 12/17/2023 15:57    Alm Schneider, DO  [facility]  If 7PM-7AM, please contact  night-coverage [email or web address] Password [email or web address] 12/18/2023, 7:13 AM   LOS: 1 day

## 2023-12-18 NOTE — Hospital Course (Signed)
 73 year old female with a history of hypertension, hyperlipidemia, HFpEF, hearing loss, OSA, chronic respiratory failure on 3 L, obesity presenting with altered mental status and worsening shortness of breath. The patient is a difficult historian secondary to her.  Impairment and some confusion.  History is supplemented by the patient's daughter at the bedside.  According to the patient's daughter, the patient has been having some intermittent confusion for the past 2 to 3 weeks, worsened over the past 2 to 3 days.  In addition, the patient has chronic dyspnea on exertion which is worse over the past 2 to 3 days.  There has been some increase in her lower extremity edema.  At baseline, the patient has chronic orthopnea.  There is been no change in her medications.  There is been no fever, chills, chest pain, nausea, vomiting.  There is been some loose stools.  There is no hematochezia, hematuria although there has been some dysuria. At baseline, the patient is able to transfer and get to the bedside commode and take a few steps.  She uses a walker.  Daughter states that the patient frequently takes her oxygen  off during the daytime even though she is supposed to be wearing it 24/7. In the ED, the patient had a low-grade temperature 99.1 F.  She was hemodynamically stable with oxygen  saturation in the 70s on room air.  She was placed on 4 L with saturation up to 99%.  WBC 5.0, hemoglobin 13.5, platelets 115.  BMP 247.  Troponin 5 >> 5.  Sodium 140, potassium 4.2, bicarbonate 32, serum creatinine 0.5.  LFTs unremarkable.  Chest x-ray showed vascular congestion.  Patient was started IV furosemide .  CT of the brain was negative for acute findings.  UA showed >50 WBC

## 2023-12-18 NOTE — Discharge Instructions (Addendum)

## 2023-12-18 NOTE — TOC Benefit Eligibility Note (Signed)
 Pharmacy Patient Advocate Encounter  Insurance verification completed.    The patient is insured through  [place] Part D . Patient has Medicare and is not eligible for a copay card, but may be able to apply for patient assistance or Medicare RX Payment Plan (Patient Must reach out to their plan, if eligible for payment plan), if available.    Ran test claim for Eliquis  5mg  and the current 30 day co-pay is $47.   This test claim was processed through [facility]- copay amounts may vary at other pharmacies due to [employer], or as the patient moves through the different stages of their insurance plan.

## 2023-12-18 NOTE — Consult Note (Addendum)
 Cardiology Consultation   Patient ID: Jenna Sherman MRN: [id number]; DOB: 1951-03-02  Admit date: 12/17/2023 Date of Consult: 12/18/2023  PCP:  Atilano Deward ORN, MD   [facility] Providers Cardiologist:  Alvan Carrier, MD        Patient Profile: Jenna Sherman is a 73 y.o. female with a hx of CAD (cath in 08/2022 showing mild, nonobstructive CAD), chronic HFpEF, HTN, HLD and COPD who is being seen 12/18/2023 for the evaluation of new-onset atrial fibrillation, bradycardia and CHF at the request of Dr. Evonnie.  History of Present Illness: [name] presented to [facility] ED yesterday afternoon for evaluation of hypoxia, bradycardia and severe altered mental status after being evaluated by home health. History was provided by her family member upon arrival and reported having worsening shortness of breath over the past week which had acutely worsened in the past 2 to 3 days. Was on 3 L nasal cannula and had experienced worsening lower extremity edema as well.   Initial EKG's showed atrial fibrillation with heart rate in the 60's and she spontaneously converted back to normal sinus rhythm but HR was in the 40's to 50's. Initial labs showed WBC 4.7, Hgb 13.8, platelets 111, Na+ 140, K+ 4.2 and creatinine 0.65. Magnesium 2.0. TSH 3.669. AST 25 and ALT 18. BNP elevated at 247. Initial and repeat Hs troponin values negative at 5. Respiratory panel negative. UA concerning for UTI and culture pending. CXR showed moderate cardiomegaly with pulmonary edema and likely small to moderate right pleural effusion. CT Head showed no acute intracranial abnormalities.  She received IV Lasix  80 mg x 1 while in the ED and has been started on IV Lasix  40 mg twice daily while being continued on Spironolactone  25 mg daily. Also started on Ceftriaxone  for likely UTI  Repeat labs today show creatinine remains stable at 0.71. K+ low at 3.3. By review of telemetry, she did have recurrent atrial fibrillation/flutter  overnight around 2200 to 2300 (two episodes with HR in the 60's and longest post-conversion pause of 2.58 seconds).Has since been in sinus bradycardia with HR in 40's.   In talking the patient today, she seems pleasantly confused. She will awaken and smile and say good morning and goes back to sleep. When asked if she has any pain, she shakes her head no. Unable to elaborate on any symptoms.  Her daughter is Jenna Sherman who is the Geographical information systems officer here at [employer]. Discussed the patient with her and she reports her mom and dad live at home together and she is typically able to perform ADLs independently. She does use supplemental oxygen . Sleeps in an adjustable bed but typically does not have difficulty breathing at night. Has experienced worsening lower extremity edema over the past few months and her daughter is unsure if she is compliant with her diuretic (Lasix  40 mg daily) due to frequent urination. Has not reported any recent chest pain or palpitations. Her daughter says that she sometimes reports chest pain when ambulating for longer distances and will request a nitroglycerin  but she feels like the pain is possibly due to low oxygen  levels at that time.   Past Medical History:  Diagnosis Date   Asthma    CHF (congestive heart failure) (HCC)    Chronic back pain    Depression    History of uterine cancer    History of vertigo    Hypertension    Normal LVF (EF 65%) by 2D Echo, August 2005. Mild left ventricular  hypertrophy; mild mitral regurgitation   Hypertriglyceridemia    Morbid obesity (HCC)    Peripheral neuropathy     Past Surgical History:  Procedure Laterality Date   CARPAL TUNNEL RELEASE  07/30/2006   REPLACEMENT TOTAL KNEE Left    RIGHT/LEFT HEART CATH AND CORONARY ANGIOGRAPHY N/A 09/03/2022   Procedure: RIGHT/LEFT HEART CATH AND CORONARY ANGIOGRAPHY;  Surgeon: Dann Candyce RAMAN, MD;  Location: [facility] INVASIVE CV LAB;  Service: Cardiovascular;  Laterality: N/A;   TOTAL ABDOMINAL  HYSTERECTOMY W/ BILATERAL SALPINGOOPHORECTOMY     uterine cancer     Home Medications:  Prior to Admission medications   Medication Sig Start Date End Date Taking? Authorizing Provider  aspirin  EC 81 MG tablet Take 1 tablet (81 mg total) by mouth daily. Swallow whole. 09/05/22  Yes Jillian Buttery, MD  atorvastatin  (LIPITOR) 40 MG tablet Take 1 tablet (40 mg total) by mouth daily. 09/05/22  Yes Adhikari, Amrit, MD  cetirizine (ZYRTEC) 10 MG tablet Take 1 tablet by mouth daily.   Yes [provider]  clonazePAM  (KLONOPIN ) 0.5 MG tablet Take 0.5 mg by mouth 2 (two) times daily. 0.5 mg am, 1 mg pm   Yes [provider]  diclofenac  (VOLTAREN ) 75 MG EC tablet Take 1 tablet (75 mg total) by mouth every 12 (twelve) hours as needed. Patient taking differently: Take 75 mg by mouth every 12 (twelve) hours as needed for mild pain (pain score 1-3). 09/04/22  Yes Adhikari, Buttery, MD  escitalopram  (LEXAPRO ) 20 MG tablet Take 20 mg by mouth at bedtime. 08/29/21  Yes [provider]  fluticasone  furoate-vilanterol (BREO ELLIPTA ) 200-25 MCG/ACT AEPB Inhale 1 puff into the lungs daily.   Yes [provider]  furosemide  (LASIX ) 40 MG tablet Take 1 tablet (40 mg total) by mouth daily. Start taking from tomorrow 09/04/22  Yes Jillian Buttery, MD  gabapentin  (NEURONTIN ) 300 MG capsule Take 600 mg by mouth at bedtime.   Yes [provider]  meclizine (ANTIVERT) 25 MG tablet Take 25 mg by mouth 3 (three) times daily as needed for dizziness.   Yes [provider]  metoprolol  succinate (TOPROL -XL) 50 MG 24 hr tablet Take 50 mg by mouth daily. 09/17/21  Yes [provider]  MYRBETRIQ 25 MG TB24 tablet Take 25 mg by mouth daily. 12/07/23  Yes [provider]  nitroGLYCERIN  (NITROSTAT ) 0.4 MG SL tablet Place 1 tablet (0.4 mg total) under the tongue every 5 (five) minutes x 3 doses as needed for chest pain (if no relief after 3rd dose, proceed to ED for evalution or  call 911). 07/21/22  Yes Miriam Norris, NP  NYAMYC  powder Apply 1 Application topically. 05/13/22  Yes [provider]  spironolactone  (ALDACTONE ) 25 MG tablet Take 1 tablet (25 mg total) by mouth daily. 09/05/22  Yes Jillian Buttery, MD  nystatin  (MYCOSTATIN /NYSTOP ) powder Apply topically 2 (two) times daily. Apply on abdominal folds 09/04/22   Jillian Buttery, MD    Scheduled Meds:  apixaban   5 mg Oral BID   atorvastatin   40 mg Oral Daily   clonazePAM   0.5 mg Oral Daily   clonazePAM   1 mg Oral QHS   escitalopram   20 mg Oral QHS   fluticasone  furoate-vilanterol  1 puff Inhalation Daily   furosemide   40 mg Intravenous Q12H   gabapentin   600 mg Oral QHS   potassium chloride   40 mEq Oral Once   spironolactone   25 mg Oral Daily   Continuous Infusions:  cefTRIAXone  (ROCEPHIN )  IV 2  g (12/17/23 2121)   PRN Meds: acetaminophen  **OR** acetaminophen , ondansetron  **OR** ondansetron  (ZOFRAN ) IV, polyethylene glycol  Allergies:    Allergies  Allergen Reactions   Ivp Dye [Iodinated Contrast Media] Shortness Of Breath    Social History:   Social History   Socioeconomic History   Marital status: Married    Spouse name: Not on file   Number of children: Not on file   Years of education: Not on file   Highest education level: Not on file  Occupational History   Not on file  Tobacco Use   Smoking status: Former    Current packs/day: 0.00    Types: Cigarettes    Start date: 04/01/1967    Quit date: 03/31/1968    Years since quitting: 55.7    Passive exposure: Never   Smokeless tobacco: Never  Vaping Use   Vaping status: Never Used  Substance and Sexual Activity   Alcohol use: Not Currently   Drug use: Not Currently   Sexual activity: Not on file  Other Topics Concern   Not on file  Social History Narrative   Not on file   Social Drivers of Health   Financial Resource Strain: Not on file  Food Insecurity: No Food Insecurity (12/17/2023)   Hunger Vital Sign    Worried  About Running Out of Food in the Last Year: Never true    Ran Out of Food in the Last Year: Never true  Transportation Needs: No Transportation Needs (12/17/2023)   PRAPARE - Administrator, Civil Service (Medical): No    Lack of Transportation (Non-Medical): No  Physical Activity: Not on file  Stress: Not on file  Social Connections: Moderately Integrated (12/17/2023)   Social Connection and Isolation Panel    Frequency of Communication with Friends and Family: More than three times a week    Frequency of Social Gatherings with Friends and Family: More than three times a week    Attends Religious Services: 1 to 4 times per year    Active Member of [employer] or Organizations: No    Attends Banker Meetings: Never    Marital Status: Married  Catering manager Violence: Not At Risk (12/17/2023)   Humiliation, Afraid, Rape, and Kick questionnaire    Fear of Current or Ex-Partner: No    Emotionally Abused: No    Physically Abused: No    Sexually Abused: No    Family History:    Family History  Problem Relation Age of Onset   Heart attack Brother 29   Heart attack Father 46   CAD Brother    CAD Father    Heart disease Sister        x's 2     ROS:  Please see the history of present illness.   All other ROS reviewed and negative.     Physical Exam/Data: Vitals:   12/17/23 2015 12/18/23 0306 12/18/23 0500 12/18/23 0733  BP:  (!) 124/57    Pulse:  (!) 41    Resp:  20    Temp:  (!) 97.5 F (36.4 C)    TempSrc:      SpO2: 98% 99%  96%  Weight:   121.7 kg   Height:        Intake/Output Summary (Last 24 hours) at 12/18/2023 0906 Last data filed at 12/18/2023 0514 Gross per 24 hour  Intake 240 ml  Output 1250 ml  Net -1010 ml      12/18/2023  5:00 AM 12/17/2023    3:26 PM 09/15/2022    8:22 AM  Last 3 Weights  Weight (lbs) 268 lb 4.8 oz 278 lb 14.4 oz 272 lb  Weight (kg) 121.7 kg 126.508 kg 123.378 kg     Body mass index is 60.15 kg/m.  General:   Pleasantly confused obese female lying in bed.  HEENT: normal Neck: JVD difficult to assess given body habitus.  Vascular: No carotid bruits; Distal pulses 2+ bilaterally Cardiac:  normal S1, S2; Regular rhythm, bradycardiac rate.  Lungs: decreased breath sounds along bases bilaterally.  Abd: soft, nontender, no hepatomegaly  Ext: 1+ pitting edema bilaterally.  Musculoskeletal:  No deformities, BUE and BLE strength normal and equal Skin: warm and dry  Neuro:  CNs 2-12 intact, no focal abnormalities noted Psych:  Normal affect   EKG:  The EKG was personally reviewed and demonstrates: Sinus bradycardia, HR 44 with LAD. No acute ST abnormalities.  Telemetry:  Telemetry was personally reviewed and demonstrates: Sinus bradycardia, HR in 40's. No evidence of high-grade AV block.   Relevant CV Studies:  Cardiac Catheterization: 08/2022   LV end diastolic pressure is normal.  LVEDP 12 mm Hg.   There is no aortic valve stenosis.   Recommend Aspirin  81mg  daily for moderate CAD.   Mild, calcific nonobstructive CAD.   Aortic saturation 94%, PA saturation 68%, mean RA pressure 5 mmHg, PA pressure 32/15, mean PA pressure 24 mmHg, cardiac output 5.89 L/min, cardiac index 2.92.   Continue preventive therapy.    Tortuosity in the right subclavian and rotation of the heart make catheter engagement difficult.  AL-1 catheter needed to engage the RCA.  If repeat catheterization was needed in the future, would consider right femoral approach.  Limited Echo: 08/2022 IMPRESSIONS     1. Left ventricular ejection fraction, by estimation, is 60 to 65%. The  left ventricle has normal function. The left ventricle has no regional  wall motion abnormalities. The left ventricular internal cavity size was  mildly dilated. Left ventricular  diastolic parameters are consistent with Grade I diastolic dysfunction  (impaired relaxation).   2. Right ventricular systolic function is normal. The right ventricular   size is normal. There is normal pulmonary artery systolic pressure. The  estimated right ventricular systolic pressure is 24.3 mmHg.   3. The mitral valve is normal in structure. No evidence of mitral valve  regurgitation. No evidence of mitral stenosis.   4. The aortic valve was not well visualized. Aortic valve regurgitation  is not visualized. No aortic stenosis is present.   5. The inferior vena cava is normal in size with greater than 50%  respiratory variability, suggesting right atrial pressure of 3 mmHg.   Laboratory Data: High Sensitivity Troponin:   Recent Labs  Lab 12/17/23 1529 12/17/23 1841  TROPONINIHS 5 5     Chemistry Recent Labs  Lab 12/17/23 1529 12/17/23 1548 12/17/23 1841 12/18/23 0301  NA 140 139  --  141  K 4.2 4.2  --  3.3*  CL 95* 94*  --  93*  CO2 32  --   --  37*  GLUCOSE 93 93  --  98  BUN 13 13  --  14  CREATININE 0.65 0.80  --  0.71  CALCIUM  8.7*  --   --  8.4*  MG  --   --  2.0  --   GFRNONAA >60  --   --  >60  ANIONGAP 13  --   --  11    Recent Labs  Lab 12/17/23 1529  PROT 7.1  ALBUMIN  3.4*  AST 25  ALT 18  ALKPHOS 50  BILITOT 1.8*   Lipids No results for input(s): CHOL, TRIG, HDL, LABVLDL, LDLCALC, CHOLHDL in the last 168 hours.  Hematology Recent Labs  Lab 12/17/23 1529 12/17/23 1548 12/18/23 0301  WBC 4.7  --  5.0  RBC 4.25  --  4.30  HGB 13.8 13.9 13.5  HCT 42.1 41.0 42.7  MCV 99.1  --  99.3  MCH 32.5  --  31.4  MCHC 32.8  --  31.6  RDW 15.3  --  15.1  PLT 111*  --  115*   Thyroid   Recent Labs  Lab 12/17/23 1841  TSH 3.669    BNP Recent Labs  Lab 12/17/23 1529  BNP 247.0*    DDimer No results for input(s): DDIMER in the last 168 hours.  Radiology/Studies:  CT Head Wo Contrast Result Date: 12/17/2023 CLINICAL DATA:  Hypoxia, bradycardia, altered level of consciousness, delirium EXAM: CT HEAD WITHOUT CONTRAST TECHNIQUE: Contiguous axial images were obtained from the base of the skull  through the vertex without intravenous contrast. RADIATION DOSE REDUCTION: This exam was performed according to the departmental dose-optimization program which includes automated exposure control, adjustment of the mA and/or kV according to patient size and/or use of iterative reconstruction technique. COMPARISON:  None Available. FINDINGS: Brain: No acute infarct or hemorrhage. Lateral ventricles and midline structures are unremarkable. No acute extra-axial fluid collections. No mass effect. Vascular: No hyperdense vessel or unexpected calcification. Skull: Normal. Negative for fracture or focal lesion. Sinuses/Orbits: No acute finding. Other: None. IMPRESSION: 1. No acute intracranial process. Electronically Signed   By: Ozell Daring M.D.   On: 12/17/2023 16:25   DG Chest Port 1 View Result Date: 12/17/2023 CLINICAL DATA:  Bradycardia, altered mental status, and hypoxia EXAM: PORTABLE CHEST - 1 VIEW COMPARISON:  September 01, 2022 FINDINGS: Bilateral perihilar interstitial opacities. Hazy airspace opacities in both lung bases. Blunting of the right costophrenic sulcus. No pneumothorax. Moderate cardiomegaly. Tortuous aorta with aortic atherosclerosis. Multilevel thoracic osteophytosis. IMPRESSION: Moderate cardiomegaly with findings of pulmonary edema and likely small to moderate right pleural effusion. Electronically Signed   By: Rogelia Myers M.D.   On: 12/17/2023 15:57     Assessment and Plan:  1. Acute HFpEF - Presented with worsening dyspnea and lower extremity edema. BNP 247 but likely inaccurate given body habitus.  CXR showed pulmonary edema and small to moderate right pleural effusion. Repeat echo is pending to assess for any structural abnormalities but echocardiogram in 08/2022 showed a preserved EF of 60 to 65% with grade 1 diastolic dysfunction and normal RV function.  - She received IV Lasix  80 mg x 1 and scheduled to start 40 mg twice daily today. Recorded net output of -1.0 L thus far and  unclear weight changes as recorded at 278 lbs on admission and at 268 lbs today. Unknown baseline weight but was previously at 272 lbs in 08/2022 so will need to establish new dry weight. Continue with IV Lasix  40 mg twice daily along with Spironolactone  25 mg daily. Would not use an SGLT2 inhibitor given her UTI.  2. New Onset Atrial Fibrillation/Flutter - EKG on admission appears most consistent with coarse atrial fibrillation and difficult to distinguish atrial fibrillation vs. flutter on telemetry. She spontaneously converted while in the ED and did have recurrence yesterday evening around 2200 with heart rates in the 60's but spontaneously converted.  Longest postconversion pause was 2.58 seconds.  She has since been in sinus bradycardia with heart rate in the 40's.  - She was on Toprol -XL 50 mg daily prior to admission and would continue to hold this. Consider alternatives for hypertension control and continue to avoid AV nodal blocking agents. - Her CHA2DS2-VASc score is 5 and she has been started on Eliquis  5 mg twice daily for anticoagulation. Would stop ASA 81 mg daily as she does not have a strong indication for both.  3.  CAD - Cardiac catheterization in 08/2022 showed mild, nonobstructive CAD as outlined above. Hs troponin values have been negative and EKG without acute ST changes. Echocardiogram is pending. - Will stop ASA given the need for anticoagulation. Continue PTA Atorvastatin  40 mg daily.  4. HTN - BP was initially elevated but has improved, at 124/57 most recent check. Remains on Spironolactone  25 mg daily.  Holding Toprol -XL as outlined above.  5.  Hypokalemia - K+ was at 3.3 today. Will order additional supplementation and she is on Spironolactone  as well.  6.  AMS/UTI - CT Head showed no acute intracranial abnormalities but UA was concerning for UTI with culture pending. Suspect AMS is secondary to this. She is on Ceftriaxone . Management per the admitting team.  7.  Chronic hypoxic respiratory failure - She is on 2 L nasal cannula at baseline but goes without this at times.   Risk Assessment/Risk Scores:  New York  Heart Association (NYHA) Functional Class NYHA Class III  CHA2DS2-VASc Score = 5  This indicates a 7.2% annual risk of stroke. The patient's score is based upon: CHF History: 1 HTN History: 1 Diabetes History: 0 Stroke History: 0 Vascular Disease History: 1 Age Score: 1 Gender Score: 1   For questions or updates, please contact [facility] Please consult [email or web address] for contact info under    Signed, Laymon CHRISTELLA Qua, PA-C  12/18/2023 9:06 AM  Attending Note  Patient seen and discussed with PA Qua, I agree with her documentation. 73 yo female history of nonobstructive CAD, chronic HFpEF, HTN, COPD, HLD, admitted with SOB, LE edema.    BNP 247 WBC 4.7 Hgb 13.8 Plt 111 K 4.2 Cr 0.65 BUN 13 TSH 3.6 Mg 2 Trop 5-->5 VBG 7/42/72/47 EKG afib rate 69 CXR +pulm edema, likely small to moderate right pleural effusion CT head: no acute process  08/2022 echo: LVEF 60-65%, grade I dd, normal RV  1.Acute on chronic HFrEF -08/2022 echo: LVEF 60-65%, grade I dd, normal RV - repeat echo pending - BNP 247, CXR with pulm edema  - received IV lasix  80mg  x 1 yesterday, due for 40mg  bid today - I/Os indicate negative 1 L so far. Renal function is stable. Continue IV diuresis - with acute UTI would not start SGLT2i.   2.PAF - new diagnosis this admission - initial EKGs shows afib in the 60s, she is already on toprol  at home - EKG later in admit back in sinus, sinus brady 40s - she has been started on eliquis  for CHADS2Vasc score of 5 - toprol  on hold given sinus brady, likely would plan for outpatient monitor to assess afib burden and rates. Pending rates into admission may warrant lower dosing of metoprolol .    3.UTI - per primary team  4.Chronic respiratory failiure - on 2L [place] at home - chronic hypercapnea that  was metabolically compensated by VBG  Dorn Ross MD

## 2023-12-18 NOTE — Plan of Care (Signed)
  Problem: Clinical Measurements: Goal: Ability to maintain clinical measurements within normal limits will improve Outcome: Progressing Goal: Will remain free from infection Outcome: Progressing Goal: Diagnostic test results will improve Outcome: Progressing Goal: Respiratory complications will improve Outcome: Progressing Goal: Cardiovascular complication will be avoided Outcome: Progressing   Problem: Nutrition: Goal: Adequate nutrition will be maintained Outcome: Progressing   Problem: Coping: Goal: Level of anxiety will decrease Outcome: Progressing   Problem: Elimination: Goal: Will not experience complications related to bowel motility Outcome: Progressing Goal: Will not experience complications related to urinary retention Outcome: Progressing   Problem: Pain Managment: Goal: General experience of comfort will improve and/or be controlled Outcome: Progressing   Problem: Safety: Goal: Ability to remain free from injury will improve Outcome: Progressing   Problem: Skin Integrity: Goal: Risk for impaired skin integrity will decrease Outcome: Progressing   Problem: Cardiac: Goal: Ability to achieve and maintain adequate cardiopulmonary perfusion will improve Outcome: Progressing   Problem: Education: Goal: Knowledge of General Education information will improve Description: Including pain rating scale, medication(s)/side effects and non-pharmacologic comfort measures Outcome: Not Progressing   Problem: Health Behavior/Discharge Planning: Goal: Ability to manage health-related needs will improve Outcome: Not Progressing   Problem: Activity: Goal: Risk for activity intolerance will decrease Outcome: Not Progressing   Problem: Education: Goal: Ability to demonstrate management of disease process will improve Outcome: Not Progressing Goal: Ability to verbalize understanding of medication therapies will improve Outcome: Not Progressing   Problem:  Activity: Goal: Capacity to carry out activities will improve Outcome: Not Progressing

## 2023-12-18 NOTE — Progress Notes (Signed)
*  PRELIMINARY RESULTS* Echocardiogram 2D Echocardiogram has been performed with Definity .  Jenna Sherman 12/18/2023, 12:23 PM

## 2023-12-18 NOTE — TOC CM/SW Note (Signed)
 Transition of Care [facility]) - Inpatient Brief Assessment   Patient Details  Name: Jenna Sherman MRN: [id number] Date of Birth: September 26, 1950  [facility]) CM/SW Contact:    Lucie Lunger, LCSWA Phone Number: 12/18/2023, 8:54 AM   Clinical Narrative: Transition of Care Department [facility]) has reviewed patient and no TOC needs have been identified at this time. We will continue to monitor patient advancement through interdiciplinary progression rounds. If new patient transition needs arise, please place a TOC consult.  Transition of Care Asessment: Insurance and Status: Insurance coverage has been reviewed Patient has primary care physician: Yes Home environment has been reviewed: From home Prior level of function:: Independent Prior/Current Home Services: No current home services Social Drivers of Health Review: SDOH reviewed no interventions necessary Readmission risk has been reviewed: Yes Transition of care needs: no transition of care needs at this time

## 2023-12-19 DIAGNOSIS — J9611 Chronic respiratory failure with hypoxia: Secondary | ICD-10-CM | POA: Diagnosis not present

## 2023-12-19 DIAGNOSIS — I5033 Acute on chronic diastolic (congestive) heart failure: Secondary | ICD-10-CM | POA: Diagnosis not present

## 2023-12-19 DIAGNOSIS — J9612 Chronic respiratory failure with hypercapnia: Secondary | ICD-10-CM | POA: Diagnosis not present

## 2023-12-19 LAB — BASIC METABOLIC PANEL WITH GFR
Anion gap: 12 (ref 5–15)
BUN: 17 mg/dL (ref 8–23)
CO2: 36 mmol/L — ABNORMAL HIGH (ref 22–32)
Calcium: 8.4 mg/dL — ABNORMAL LOW (ref 8.9–10.3)
Chloride: 92 mmol/L — ABNORMAL LOW (ref 98–111)
Creatinine, Ser: 0.7 mg/dL (ref 0.44–1.00)
GFR, Estimated: >60 mL/min (ref >60)
Glucose, Bld: 103 mg/dL — ABNORMAL HIGH (ref 70–99)
Potassium: 3.9 mmol/L (ref 3.5–5.1)
Sodium: 140 mmol/L (ref 135–145)

## 2023-12-19 LAB — C DIFFICILE QUICK SCREEN W PCR REFLEX
C Diff antigen: NEGATIVE
C Diff interpretation: NOT DETECTED
C Diff toxin: NEGATIVE

## 2023-12-19 LAB — URINE CULTURE: Culture: <10000 — AB

## 2023-12-19 MED ORDER — FOLIC ACID 1 MG PO TABS
1.0000 mg | ORAL_TABLET | Freq: Every day | ORAL | Status: DC
  Administered 2023-12-19 – 2023-12-21 (×3): 1 mg via ORAL
  Filled 2023-12-19 (×3): qty 1

## 2023-12-19 MED ORDER — ACETAMINOPHEN 500 MG PO TABS
1000.0000 mg | ORAL_TABLET | Freq: Three times a day (TID) | ORAL | Status: DC
Start: 2023-12-19 — End: 2023-12-21
  Administered 2023-12-19 – 2023-12-20 (×5): 1000 mg via ORAL
  Filled 2023-12-19 (×6): qty 2

## 2023-12-19 MED ORDER — VITAMIN B-12 100 MCG PO TABS
500.0000 ug | ORAL_TABLET | Freq: Every day | ORAL | Status: DC
  Administered 2023-12-19 – 2023-12-21 (×3): 500 ug via ORAL
  Filled 2023-12-19 (×3): qty 5

## 2023-12-19 MED ORDER — TRAMADOL HCL 50 MG PO TABS: 50.0000 mg | ORAL_TABLET | Freq: Four times a day (QID) | ORAL | Status: DC | PRN

## 2023-12-19 NOTE — Plan of Care (Signed)

## 2023-12-19 NOTE — Progress Notes (Signed)
 PROGRESS NOTE  Jenna Sherman FMW:[id number] DOB: 1950/04/17 DOA: 12/17/2023 PCP: Atilano Deward ORN, MD  Brief History:  73 year old female with a history of hypertension, hyperlipidemia, HFpEF, hearing loss, OSA, chronic respiratory failure on 3 L, obesity presenting with altered mental status and worsening shortness of breath. The patient is a difficult historian secondary to her.  Impairment and some confusion.  History is supplemented by the patient's daughter at the bedside.  According to the patient's daughter, the patient has been having some intermittent confusion for the past 2 to 3 weeks, worsened over the past 2 to 3 days.  In addition, the patient has chronic dyspnea on exertion which is worse over the past 2 to 3 days.  There has been some increase in her lower extremity edema.  At baseline, the patient has chronic orthopnea.  There is been no change in her medications.  There is been no fever, chills, chest pain, nausea, vomiting.  There is been some loose stools.  There is no hematochezia, hematuria although there has been some dysuria. At baseline, the patient is able to transfer and get to the bedside commode and take a few steps.  She uses a walker.  Daughter states that the patient frequently takes her oxygen  off during the daytime even though she is supposed to be wearing it 24/7. In the ED, the patient had a low-grade temperature 99.1 F.  She was hemodynamically stable with oxygen  saturation in the 70s on room air.  She was placed on 4 L with saturation up to 99%.  WBC 5.0, hemoglobin 13.5, platelets 115.  BMP 247.  Troponin 5 >> 5.  Sodium 140, potassium 4.2, bicarbonate 32, serum creatinine 0.5.  LFTs unremarkable.  Chest x-ray showed vascular congestion.  Patient was started IV furosemide .  CT of the brain was negative for acute findings.  UA showed >50 WBC    Assessment/Plan:  Acute on chronic HFpEF - 09/03/2022 echo EF 60-65%, grade 1 DD, normal RVF - 12/19/23 Echo EF  65-70%, no WMA, normal RVF - Continue IV furosemide  - Daily weights - Accurate I's and O's--NEG 2.5L   Chronic respiratory failure with hypoxia and hypercarbia - 12/17/2023 VBG 7.40 8/72/34/47 - Stable on 3 L - COVID/RSV/Flu neg   New onset atrial fibrillation - Patient has converted back to sinus rhythm - continue apixaban  - CHADSVASc = 5 (CHF, HTN, Age, female, ASVD)   UTI -UA >50 WBC - urine culture unrevealing -received ceftriaxone  x 3 days   Acute metabolic encephalopathy - Secondary to infectious process and hypoxia - CT brain negative -UA>50 WBC -9/20--mental status back to baseline   Diarrhea - Stool pathogen panel--pending - Check C. Difficile--neg   Thrombocytopenia - chronic dating back to 08/2021 -B12--293 -folate--7.6 -TSH 3.669   Essential hypertension - Holding metoprolol  secondary to bradycardia - Reviewed telemetry and EKG--sinus pericardia, no high-grade AV block   Mixed hyperlipidemia - Continue statin   OSA - Patient does not use CPAP   Anxiety - Continue clonazepam  and Lexapro  - PDMP reviewed-- -clonazepam , 0.5 mg, #90, last refill 12/01/2023   Morbid obesity - BMI 60.15 - Lifestyle modification     Family Communication:   Daughter at bedside 9/19   Consultants:  none   Code Status:  FULL    DVT Prophylaxis:  apixaban      Procedures: As Listed in Progress Note Above   Antibiotics: Ceftriaxone  9/18>>9/20        Subjective:  Pt is breathing better.  Denies f/c, cp, sob, n/v/d, abd pain  Objective: Vitals:   12/19/23 0629 12/19/23 0722 12/19/23 0816 12/19/23 1347  BP: (!) 105/44  (!) 113/51 (!) 103/51  Pulse: (!) 47  (!) 46 (!) 49  Resp: 18  16 17   Temp: 97.6 F (36.4 C)  97.9 F (36.6 C) 98 F (36.7 C)  TempSrc: Axillary  Oral Oral  SpO2: 90% 94% 97% 95%  Weight:      Height:        Intake/Output Summary (Last 24 hours) at 12/19/2023 1733 Last data filed at 12/19/2023 1503 Gross per 24 hour  Intake 437.56 ml   Output 2100 ml  Net -1662.44 ml   Weight change: -4.708 kg Exam:  General:  Pt is alert, follows commands appropriately, not in acute distress HEENT: No icterus, No thrush, No neck mass, [place]/AT Cardiovascular: RRR, S1/S2, no rubs, no gallops Respiratory: bibasilar crackles. No wheeze Abdomen: Soft/+BS, non tender, non distended, no guarding Extremities: 1+ LE edema, No lymphangitis, No petechiae, No rashes, no synovitis   Data Reviewed: I have personally reviewed following labs and imaging studies Basic Metabolic Panel: Recent Labs  Lab 12/17/23 1529 12/17/23 1548 12/17/23 1841 12/18/23 0301 12/19/23 0320  NA 140 139  --  141 140  K 4.2 4.2  --  3.3* 3.9  CL 95* 94*  --  93* 92*  CO2 32  --   --  37* 36*  GLUCOSE 93 93  --  98 103*  BUN 13 13  --  14 17  CREATININE 0.65 0.80  --  0.71 0.70  CALCIUM  8.7*  --   --  8.4* 8.4*  MG  --   --  2.0  --   --    Liver Function Tests: Recent Labs  Lab 12/17/23 1529  AST 25  ALT 18  ALKPHOS 50  BILITOT 1.8*  PROT 7.1  ALBUMIN  3.4*   No results for input(s): LIPASE, AMYLASE in the last 168 hours. Recent Labs  Lab 12/17/23 1619  AMMONIA 18   Coagulation Profile: No results for input(s): INR, PROTIME in the last 168 hours. CBC: Recent Labs  Lab 12/17/23 1529 12/17/23 1548 12/18/23 0301  WBC 4.7  --  5.0  NEUTROABS 2.8  --   --   HGB 13.8 13.9 13.5  HCT 42.1 41.0 42.7  MCV 99.1  --  99.3  PLT 111*  --  115*   Cardiac Enzymes: No results for input(s): CKTOTAL, CKMB, CKMBINDEX, TROPONINI in the last 168 hours. BNP: Invalid input(s): POCBNP CBG: Recent Labs  Lab 12/17/23 1518 12/18/23 0026 12/18/23 0305  GLUCAP 91 103* 98   HbA1C: No results for input(s): HGBA1C in the last 72 hours. Urine analysis:    Component Value Date/Time   COLORURINE YELLOW 12/17/2023 1710   APPEARANCEUR HAZY (A) 12/17/2023 1710   LABSPEC 1.005 12/17/2023 1710   PHURINE 7.0 12/17/2023 1710   GLUCOSEU  NEGATIVE 12/17/2023 1710   HGBUR MODERATE (A) 12/17/2023 1710   BILIRUBINUR NEGATIVE 12/17/2023 1710   KETONESUR NEGATIVE 12/17/2023 1710   PROTEINUR NEGATIVE 12/17/2023 1710   UROBILINOGEN 1.0 04/10/2010 1637   NITRITE POSITIVE (A) 12/17/2023 1710   LEUKOCYTESUR LARGE (A) 12/17/2023 1710   Sepsis Labs: @LABRCNTIP (procalcitonin:4,lacticidven:4) ) Recent Results (from the past 240 hours)  Resp panel by RT-PCR (RSV, Flu A&B, Covid) Anterior Nasal Swab     Status: None   Collection Time: 12/17/23  3:55 PM   Specimen: Anterior Nasal  Swab  Result Value Ref Range Status   SARS Coronavirus 2 by RT PCR NEGATIVE NEGATIVE Final    Comment: (NOTE) SARS-CoV-2 target nucleic acids are NOT DETECTED.  The SARS-CoV-2 RNA is generally detectable in upper respiratory specimens during the acute phase of infection. The lowest concentration of SARS-CoV-2 viral copies this assay can detect is 138 copies/mL. A negative result does not preclude SARS-Cov-2 infection and should not be used as the sole basis for treatment or other patient management decisions. A negative result may occur with  improper specimen collection/handling, submission of specimen other than nasopharyngeal swab, presence of viral mutation(s) within the areas targeted by this assay, and inadequate number of viral copies(<138 copies/mL). A negative result must be combined with clinical observations, patient history, and epidemiological information. The expected result is Negative.  Fact Sheet for Patients:  BloggerCourse.com  Fact Sheet for Healthcare Providers:  SeriousBroker.it  This test is no t yet approved or cleared by the [place]  FDA and  has been authorized for detection and/or diagnosis of SARS-CoV-2 by FDA under an Emergency Use Authorization (EUA). This EUA will remain  in effect (meaning this test can be used) for the duration of the COVID-19 declaration under  Section 564(b)(1) of the Act, 21 U.S.C.section 360bbb-3(b)(1), unless the authorization is terminated  or revoked sooner.       Influenza A by PCR NEGATIVE NEGATIVE Final   Influenza B by PCR NEGATIVE NEGATIVE Final    Comment: (NOTE) The Xpert Xpress SARS-CoV-2/FLU/RSV plus assay is intended as an aid in the diagnosis of influenza from Nasopharyngeal swab specimens and should not be used as a sole basis for treatment. Nasal washings and aspirates are unacceptable for Xpert Xpress SARS-CoV-2/FLU/RSV testing.  Fact Sheet for Patients: BloggerCourse.com  Fact Sheet for Healthcare Providers: SeriousBroker.it  This test is not yet approved or cleared by the [place]  FDA and has been authorized for detection and/or diagnosis of SARS-CoV-2 by FDA under an Emergency Use Authorization (EUA). This EUA will remain in effect (meaning this test can be used) for the duration of the COVID-19 declaration under Section 564(b)(1) of the Act, 21 U.S.C. section 360bbb-3(b)(1), unless the authorization is terminated or revoked.     Resp Syncytial Virus by PCR NEGATIVE NEGATIVE Final    Comment: (NOTE) Fact Sheet for Patients: BloggerCourse.com  Fact Sheet for Healthcare Providers: SeriousBroker.it  This test is not yet approved or cleared by the [place]  FDA and has been authorized for detection and/or diagnosis of SARS-CoV-2 by FDA under an Emergency Use Authorization (EUA). This EUA will remain in effect (meaning this test can be used) for the duration of the COVID-19 declaration under Section 564(b)(1) of the Act, 21 U.S.C. section 360bbb-3(b)(1), unless the authorization is terminated or revoked.  Performed at [facility], [address] [place], [place] [address]   Urine Culture (for pregnant, neutropenic or urologic patients or patients with an indwelling urinary catheter)      Status: Abnormal   Collection Time: 12/17/23  5:10 PM   Specimen: Urine, Clean Catch  Result Value Ref Range Status   Specimen Description   Final    URINE, CLEAN CATCH Performed at [facility], [address], [place], [place] [address]    Special Requests   Final    NONE Performed at [facility], [address], [place], [place] [address]    Culture (A)  Final    <10,000 COLONIES/mL INSIGNIFICANT GROWTH Performed at [facility], [address] [place], [place] [address]    Report Status 12/19/2023 FINAL  Final  C Difficile Quick Screen w PCR reflex     Status: None   Collection Time: 12/18/23  7:27 AM   Specimen: Stool  Result Value Ref Range Status   C Diff antigen NEGATIVE NEGATIVE Final   C Diff toxin NEGATIVE NEGATIVE Final   C Diff interpretation No C. difficile detected.  Final    Comment: Performed at [facility], [address], [place], [place] [address]     Scheduled Meds:  acetaminophen   1,000 mg Oral Q8H   apixaban   5 mg Oral BID   atorvastatin   40 mg Oral Daily   clonazePAM   0.5 mg Oral Daily   clonazePAM   1 mg Oral QHS   escitalopram   20 mg Oral QHS   fluticasone  furoate-vilanterol  1 puff Inhalation Daily   folic acid   1 mg Oral Daily   furosemide   40 mg Intravenous Q12H   gabapentin   600 mg Oral QHS   spironolactone   25 mg Oral Daily   vitamin B-12  500 mcg Oral Daily   Continuous Infusions:  cefTRIAXone  (ROCEPHIN )  IV 2 g (12/18/23 1732)    Procedures/Studies: ECHOCARDIOGRAM COMPLETE Result Date: 12/18/2023    ECHOCARDIOGRAM REPORT   Patient Name:   SITA MANGEN Dunleavy Date of Exam: 12/18/2023 Medical Rec #:  [id number]    Height:       56.0 in Accession #:    [id number]   Weight:       268.3 lb Date of Birth:  [birth year]    BSA:          2.011 m Patient Age:    72 years     BP:           124/57 mmHg Patient Gender: F            HR:           41 bpm. Exam Location:  [facility] Procedure: 2D Echo, Cardiac Doppler, Color Doppler and Intracardiac             Opacification Agent (Both Spectral and Color Flow Doppler were            utilized during procedure). Indications:    Congestive Heart Failure I50.9  History:        Patient has prior history of Echocardiogram examinations, most                 recent 09/03/2022. CHF; Risk Factors:Sleep Apnea, Dyslipidemia and                 Hypertension.  Sonographer:    Aida Pizza RCS Referring Phys: [phone] TULLY BRAVO [facility]  Sonographer Comments: Technically difficult study due to poor echo windows. Image acquisition challenging due to patient body habitus. IMPRESSIONS  1. Left ventricular ejection fraction, by estimation, is 65 to 70%. The left ventricle has normal function. The left ventricle has no regional wall motion abnormalities. Left ventricular diastolic parameters are indeterminate.  2. Right ventricular systolic function is normal. The right ventricular size is normal. Tricuspid regurgitation signal is inadequate for assessing PA pressure.  3. Left atrial size was mildly dilated.  4. The mitral valve is normal in structure. No evidence of mitral valve regurgitation. No evidence of mitral stenosis.  5. The aortic valve is tricuspid. Aortic valve regurgitation is not visualized. No aortic stenosis is present.  6. The inferior vena cava is normal in size with greater than  50% respiratory variability, suggesting right atrial pressure of 3 mmHg. FINDINGS  Left Ventricle: Left ventricular ejection fraction, by estimation, is 65 to 70%. The left ventricle has normal function. The left ventricle has no regional wall motion abnormalities. Definity  contrast agent was given IV to delineate the left ventricular  endocardial borders. The left ventricular internal cavity size was normal in size. There is no left ventricular hypertrophy. Left ventricular diastolic parameters are indeterminate. Right Ventricle: The right ventricular size is normal. Right vetricular wall thickness was not well visualized. Right ventricular  systolic function is normal. Tricuspid regurgitation signal is inadequate for assessing PA pressure. Left Atrium: Left atrial size was mildly dilated. Right Atrium: Right atrial size was normal in size. Pericardium: There is no evidence of pericardial effusion. Mitral Valve: The mitral valve is normal in structure. No evidence of mitral valve regurgitation. No evidence of mitral valve stenosis. Tricuspid Valve: The tricuspid valve is not well visualized. Tricuspid valve regurgitation is not demonstrated. No evidence of tricuspid stenosis. Aortic Valve: The aortic valve is tricuspid. Aortic valve regurgitation is not visualized. No aortic stenosis is present. Aortic valve mean gradient measures 11.0 mmHg. Aortic valve peak gradient measures 22.6 mmHg. Aortic valve area, by VTI measures 1.88 cm. Pulmonic Valve: The pulmonic valve was not well visualized. Pulmonic valve regurgitation is not visualized. No evidence of pulmonic stenosis. Aorta: The aortic root is normal in size and structure and the ascending aorta was not well visualized. Venous: The inferior vena cava is normal in size with greater than 50% respiratory variability, suggesting right atrial pressure of 3 mmHg. IAS/Shunts: The interatrial septum was not well visualized.  LEFT VENTRICLE PLAX 2D LVIDd:         5.20 cm   Diastology LVIDs:         2.70 cm   LV e' medial:    9.08 cm/s LV PW:         0.90 cm   LV E/e' medial:  12.4 LV IVS:        1.00 cm   LV e' lateral:   9.20 cm/s LVOT diam:     2.00 cm   LV E/e' lateral: 12.3 LV SV:         93 LV SV Index:   46 LVOT Area:     3.14 cm  RIGHT VENTRICLE RV S prime:     15.20 cm/s TAPSE (M-mode): 2.8 cm LEFT ATRIUM              Index LA diam:        4.20 cm  2.09 cm/m LA Vol (A2C):   50.3 ml  25.01 ml/m LA Vol (A4C):   104.0 ml 51.71 ml/m LA Biplane Vol: 77.7 ml  38.63 ml/m  AORTIC VALVE AV Area (Vmax):    1.39 cm AV Area (Vmean):   1.41 cm AV Area (VTI):     1.88 cm AV Vmax:           237.50 cm/s AV  Vmean:          149.000 cm/s AV VTI:            0.494 m AV Peak Grad:      22.6 mmHg AV Mean Grad:      11.0 mmHg LVOT Vmax:         105.00 cm/s LVOT Vmean:        67.000 cm/s LVOT VTI:          0.296 m LVOT/AV VTI  ratio: 0.60  AORTA Ao Root diam: 3.30 cm MITRAL VALVE MV Area (PHT): 1.84 cm     SHUNTS MV Decel Time: 412 msec     Systemic VTI:  0.30 m MV E velocity: 113.00 cm/s  Systemic Diam: 2.00 cm MV A velocity: 98.50 cm/s MV E/A ratio:  1.15 Dorn Ross MD Electronically signed by Dorn Ross MD Signature Date/Time: 12/18/2023/12:18:15 PM    Final    US  ARTERIAL ABI (SCREENING LOWER EXTREMITY) Result Date: 12/18/2023 CLINICAL DATA:  Leg swelling EXAM: NONINVASIVE PHYSIOLOGIC VASCULAR STUDY OF BILATERAL LOWER EXTREMITIES TECHNIQUE: Evaluation of both lower extremities were performed at rest, including calculation of ankle-brachial indices with single level Doppler, pressure and pulse volume recording. COMPARISON:  None Available. FINDINGS: Right ABI:  1.0 Left ABI:  1.0 Right Lower Extremity:  Normal arterial waveforms at the ankle. Left Lower Extremity:  Normal arterial waveforms at the ankle. 1.0-1.4 Normal IMPRESSION: 1. ABIs are within normal limits. No evidence of significant peripheral arterial disease. Electronically Signed   By: Maude Naegeli M.D.   On: 12/18/2023 11:18   CT Head Wo Contrast Result Date: 12/17/2023 CLINICAL DATA:  Hypoxia, bradycardia, altered level of consciousness, delirium EXAM: CT HEAD WITHOUT CONTRAST TECHNIQUE: Contiguous axial images were obtained from the base of the skull through the vertex without intravenous contrast. RADIATION DOSE REDUCTION: This exam was performed according to the departmental dose-optimization program which includes automated exposure control, adjustment of the mA and/or kV according to patient size and/or use of iterative reconstruction technique. COMPARISON:  None Available. FINDINGS: Brain: No acute infarct or hemorrhage. Lateral ventricles  and midline structures are unremarkable. No acute extra-axial fluid collections. No mass effect. Vascular: No hyperdense vessel or unexpected calcification. Skull: Normal. Negative for fracture or focal lesion. Sinuses/Orbits: No acute finding. Other: None. IMPRESSION: 1. No acute intracranial process. Electronically Signed   By: Ozell Daring M.D.   On: 12/17/2023 16:25   DG Chest Port 1 View Result Date: 12/17/2023 CLINICAL DATA:  Bradycardia, altered mental status, and hypoxia EXAM: PORTABLE CHEST - 1 VIEW COMPARISON:  September 01, 2022 FINDINGS: Bilateral perihilar interstitial opacities. Hazy airspace opacities in both lung bases. Blunting of the right costophrenic sulcus. No pneumothorax. Moderate cardiomegaly. Tortuous aorta with aortic atherosclerosis. Multilevel thoracic osteophytosis. IMPRESSION: Moderate cardiomegaly with findings of pulmonary edema and likely small to moderate right pleural effusion. Electronically Signed   By: Rogelia Myers M.D.   On: 12/17/2023 15:57    Alm Schneider, DO  [facility]  If 7PM-7AM, please contact night-coverage [email or web address] Password [facility] 12/19/2023, 5:33 PM   LOS: 2 days

## 2023-12-20 DIAGNOSIS — N3001 Acute cystitis with hematuria: Secondary | ICD-10-CM | POA: Diagnosis not present

## 2023-12-20 DIAGNOSIS — I5033 Acute on chronic diastolic (congestive) heart failure: Secondary | ICD-10-CM | POA: Diagnosis not present

## 2023-12-20 DIAGNOSIS — J9611 Chronic respiratory failure with hypoxia: Secondary | ICD-10-CM | POA: Diagnosis not present

## 2023-12-20 LAB — GASTROINTESTINAL PANEL BY PCR, STOOL (REPLACES STOOL CULTURE)

## 2023-12-20 LAB — RESPIRATORY PANEL BY PCR

## 2023-12-20 LAB — BASIC METABOLIC PANEL WITH GFR
Anion gap: 12 (ref 5–15)
BUN: 15 mg/dL (ref 8–23)
CO2: 37 mmol/L — ABNORMAL HIGH (ref 22–32)
Calcium: 8.8 mg/dL — ABNORMAL LOW (ref 8.9–10.3)
Chloride: 91 mmol/L — ABNORMAL LOW (ref 98–111)
Creatinine, Ser: 0.63 mg/dL (ref 0.44–1.00)
GFR, Estimated: >60 mL/min (ref >60)
Glucose, Bld: 93 mg/dL (ref 70–99)
Potassium: 3.7 mmol/L (ref 3.5–5.1)
Sodium: 140 mmol/L (ref 135–145)

## 2023-12-20 LAB — MAGNESIUM: Magnesium: 2.2 mg/dL (ref 1.7–2.4)

## 2023-12-20 MED ORDER — FUROSEMIDE 40 MG PO TABS
40.0000 mg | ORAL_TABLET | Freq: Every day | ORAL | Status: DC
Start: 2023-12-21 — End: 2023-12-21
  Administered 2023-12-21: 40 mg via ORAL
  Filled 2023-12-20: qty 1

## 2023-12-20 MED ORDER — POLYETHYLENE GLYCOL 3350 17 G PO PACK
17.0000 g | PACK | Freq: Every day | ORAL | Status: DC
  Administered 2023-12-20 – 2023-12-21 (×2): 17 g via ORAL
  Filled 2023-12-20 (×2): qty 1

## 2023-12-20 MED ORDER — CEFADROXIL 500 MG PO CAPS
500.0000 mg | ORAL_CAPSULE | Freq: Two times a day (BID) | ORAL | Status: DC
  Administered 2023-12-20 – 2023-12-21 (×2): 500 mg via ORAL
  Filled 2023-12-20 (×2): qty 1

## 2023-12-20 MED ORDER — POTASSIUM CHLORIDE CRYS ER 20 MEQ PO TBCR
40.0000 meq | EXTENDED_RELEASE_TABLET | Freq: Once | ORAL | Status: AC
  Administered 2023-12-20: 40 meq via ORAL
  Filled 2023-12-20: qty 2

## 2023-12-20 NOTE — Progress Notes (Signed)
 Telemetry called and stated patient had fully converted to afib/flutter. Dr. Adefeso made aware through secure chat.  VS WNL and patient has no complaints at this time.

## 2023-12-20 NOTE — Progress Notes (Signed)
 Tele called patients HR 130. Went to assess patient, patient was getting up from the bathroom ambulating. MD Tat made aware.

## 2023-12-20 NOTE — Progress Notes (Signed)
 Noted during patients assessment that patient had redness to abdominal folds and bilateral breast. MD Tat made aware. New order placed.

## 2023-12-20 NOTE — Plan of Care (Signed)
  Problem: Clinical Measurements: Goal: Respiratory complications will improve Outcome: Progressing Goal: Cardiovascular complication will be avoided Outcome: Progressing   Problem: Nutrition: Goal: Adequate nutrition will be maintained Outcome: Progressing   Problem: Pain Managment: Goal: General experience of comfort will improve and/or be controlled Outcome: Progressing

## 2023-12-20 NOTE — Plan of Care (Signed)
  Problem: Education: Goal: Knowledge of General Education information will improve Description: Including pain rating scale, medication(s)/side effects and non-pharmacologic comfort measures Outcome: Progressing   Problem: Clinical Measurements: Goal: Ability to maintain clinical measurements within normal limits will improve Outcome: Progressing   Problem: Elimination: Goal: Will not experience complications related to bowel motility Outcome: Progressing   Problem: Pain Managment: Goal: General experience of comfort will improve and/or be controlled Outcome: Progressing

## 2023-12-20 NOTE — Progress Notes (Signed)
 PROGRESS NOTE  Jenna Sherman FMW:[id number] DOB: 1950/07/17 DOA: 12/17/2023 PCP: Atilano Deward ORN, MD  Brief History:  73 year old female with a history of hypertension, hyperlipidemia, HFpEF, hearing loss, OSA, chronic respiratory failure on 3 L, obesity presenting with altered mental status and worsening shortness of breath. The patient is a difficult historian secondary to her.  Impairment and some confusion.  History is supplemented by the patient's daughter at the bedside.  According to the patient's daughter, the patient has been having some intermittent confusion for the past 2 to 3 weeks, worsened over the past 2 to 3 days.  In addition, the patient has chronic dyspnea on exertion which is worse over the past 2 to 3 days.  There has been some increase in her lower extremity edema.  At baseline, the patient has chronic orthopnea.  There is been no change in her medications.  There is been no fever, chills, chest pain, nausea, vomiting.  There is been some loose stools.  There is no hematochezia, hematuria although there has been some dysuria. At baseline, the patient is able to transfer and get to the bedside commode and take a few steps.  She uses a walker.  Daughter states that the patient frequently takes her oxygen  off during the daytime even though she is supposed to be wearing it 24/7. In the ED, the patient had a low-grade temperature 99.1 F.  She was hemodynamically stable with oxygen  saturation in the 70s on room air.  She was placed on 4 L with saturation up to 99%.  WBC 5.0, hemoglobin 13.5, platelets 115.  BMP 247.  Troponin 5 >> 5.  Sodium 140, potassium 4.2, bicarbonate 32, serum creatinine 0.5.  LFTs unremarkable.  Chest x-ray showed vascular congestion.  Patient was started IV furosemide .  CT of the brain was negative for acute findings.  UA showed >50 WBC    Assessment/Plan:  Acute on chronic HFpEF - 09/03/2022 echo EF 60-65%, grade 1 DD, normal RVF - 12/19/23 Echo EF  65-70%, no WMA, normal RVF - Continue IV furosemide >>po lasix  - Daily weights--neg 8 lbs - Accurate I's and O's--NEG 4.6L   Chronic respiratory failure with hypoxia and hypercarbia - 12/17/2023 VBG 7.40 8/72/34/47 - Stable on 3 L - COVID/RSV/Flu neg   New onset atrial fibrillation - Patient has converted back to sinus rhythm - now in sinus bradycardia - continue apixaban  - CHADSVASc = 5 (CHF, HTN, Age, female, ASVD)   UTI -UA >50 WBC - urine culture unrevealing -received ceftriaxone  x 3 days   Acute metabolic encephalopathy - Secondary to infectious process and hypoxia - CT brain negative -UA>50 WBC -9/20--mental status back to baseline   Diarrhea - Stool pathogen panel--pending - Check C. Difficile--neg   Thrombocytopenia - chronic dating back to 08/2021 -B12--293 -folate--7.6 -TSH 3.669   Essential hypertension - Holding metoprolol  secondary to bradycardia - Reviewed telemetry and EKG--sinus pericardia, no high-grade AV block   Mixed hyperlipidemia - Continue statin   OSA - Patient does not use CPAP   Anxiety - Continue clonazepam  and Lexapro  - PDMP reviewed-- -clonazepam , 0.5 mg, #90, last refill 12/01/2023   Morbid obesity - BMI 60.15 - Lifestyle modification       Family Communication:   Daughter at bedside 9/21   Consultants:  none   Code Status:  FULL    DVT Prophylaxis:  apixaban      Procedures: As Listed in Progress Note Above   Antibiotics:  Ceftriaxone  9/18>>9/20        Subjective: Pt complains of constipation.  Denies n/v/d.  Sob is improved.  Denies cp, f/c  Objective: Vitals:   12/20/23 0435 12/20/23 0500 12/20/23 0728 12/20/23 1320  BP: (!) 121/51   (!) 116/56  Pulse: (!) 50   61  Resp: 17   18  Temp: 98.5 F (36.9 C)   98.4 F (36.9 C)  TempSrc: Oral   Oral  SpO2: 97%  97% 97%  Weight:  117.8 kg    Height:        Intake/Output Summary (Last 24 hours) at 12/20/2023 1537 Last data filed at 12/20/2023 1142 Gross  per 24 hour  Intake 240 ml  Output 2300 ml  Net -2060 ml   Weight change: -4 kg Exam:  General:  Pt is alert, follows commands appropriately, not in acute distress HEENT: No icterus, No thrush, No neck mass, [place]/AT Cardiovascular: RRR, S1/S2, no rubs, no gallops Respiratory: CTA bilaterally, no wheezing, no crackles, no rhonchi Abdomen: Soft/+BS, mild periumb tender, non distended, no guarding Extremities: No edema, No lymphangitis, No petechiae, No rashes, no synovitis   Data Reviewed: I have personally reviewed following labs and imaging studies Basic Metabolic Panel: Recent Labs  Lab 12/17/23 1529 12/17/23 1548 12/17/23 1841 12/18/23 0301 12/19/23 0320 12/20/23 0347  NA 140 139  --  141 140 140  K 4.2 4.2  --  3.3* 3.9 3.7  CL 95* 94*  --  93* 92* 91*  CO2 32  --   --  37* 36* 37*  GLUCOSE 93 93  --  98 103* 93  BUN 13 13  --  14 17 15   CREATININE 0.65 0.80  --  0.71 0.70 0.63  CALCIUM  8.7*  --   --  8.4* 8.4* 8.8*  MG  --   --  2.0  --   --  2.2   Liver Function Tests: Recent Labs  Lab 12/17/23 1529  AST 25  ALT 18  ALKPHOS 50  BILITOT 1.8*  PROT 7.1  ALBUMIN  3.4*   No results for input(s): LIPASE, AMYLASE in the last 168 hours. Recent Labs  Lab 12/17/23 1619  AMMONIA 18   Coagulation Profile: No results for input(s): INR, PROTIME in the last 168 hours. CBC: Recent Labs  Lab 12/17/23 1529 12/17/23 1548 12/18/23 0301  WBC 4.7  --  5.0  NEUTROABS 2.8  --   --   HGB 13.8 13.9 13.5  HCT 42.1 41.0 42.7  MCV 99.1  --  99.3  PLT 111*  --  115*   Cardiac Enzymes: No results for input(s): CKTOTAL, CKMB, CKMBINDEX, TROPONINI in the last 168 hours. BNP: Invalid input(s): POCBNP CBG: Recent Labs  Lab 12/17/23 1518 12/18/23 0026 12/18/23 0305  GLUCAP 91 103* 98   HbA1C: No results for input(s): HGBA1C in the last 72 hours. Urine analysis:    Component Value Date/Time   COLORURINE YELLOW 12/17/2023 1710   APPEARANCEUR  HAZY (A) 12/17/2023 1710   LABSPEC 1.005 12/17/2023 1710   PHURINE 7.0 12/17/2023 1710   GLUCOSEU NEGATIVE 12/17/2023 1710   HGBUR MODERATE (A) 12/17/2023 1710   BILIRUBINUR NEGATIVE 12/17/2023 1710   KETONESUR NEGATIVE 12/17/2023 1710   PROTEINUR NEGATIVE 12/17/2023 1710   UROBILINOGEN 1.0 04/10/2010 1637   NITRITE POSITIVE (A) 12/17/2023 1710   LEUKOCYTESUR LARGE (A) 12/17/2023 1710   Sepsis Labs: @LABRCNTIP (procalcitonin:4,lacticidven:4) ) Recent Results (from the past 240 hours)  Resp panel by RT-PCR (RSV, Flu A&B,  Covid) Anterior Nasal Swab     Status: None   Collection Time: 12/17/23  3:55 PM   Specimen: Anterior Nasal Swab  Result Value Ref Range Status   SARS Coronavirus 2 by RT PCR NEGATIVE NEGATIVE Final    Comment: (NOTE) SARS-CoV-2 target nucleic acids are NOT DETECTED.  The SARS-CoV-2 RNA is generally detectable in upper respiratory specimens during the acute phase of infection. The lowest concentration of SARS-CoV-2 viral copies this assay can detect is 138 copies/mL. A negative result does not preclude SARS-Cov-2 infection and should not be used as the sole basis for treatment or other patient management decisions. A negative result may occur with  improper specimen collection/handling, submission of specimen other than nasopharyngeal swab, presence of viral mutation(s) within the areas targeted by this assay, and inadequate number of viral copies(<138 copies/mL). A negative result must be combined with clinical observations, patient history, and epidemiological information. The expected result is Negative.  Fact Sheet for Patients:  BloggerCourse.com  Fact Sheet for Healthcare Providers:  SeriousBroker.it  This test is no t yet approved or cleared by the [place]  FDA and  has been authorized for detection and/or diagnosis of SARS-CoV-2 by FDA under an Emergency Use Authorization (EUA). This EUA will  remain  in effect (meaning this test can be used) for the duration of the COVID-19 declaration under Section 564(b)(1) of the Act, 21 U.S.C.section 360bbb-3(b)(1), unless the authorization is terminated  or revoked sooner.       Influenza A by PCR NEGATIVE NEGATIVE Final   Influenza B by PCR NEGATIVE NEGATIVE Final    Comment: (NOTE) The Xpert Xpress SARS-CoV-2/FLU/RSV plus assay is intended as an aid in the diagnosis of influenza from Nasopharyngeal swab specimens and should not be used as a sole basis for treatment. Nasal washings and aspirates are unacceptable for Xpert Xpress SARS-CoV-2/FLU/RSV testing.  Fact Sheet for Patients: BloggerCourse.com  Fact Sheet for Healthcare Providers: SeriousBroker.it  This test is not yet approved or cleared by the [place]  FDA and has been authorized for detection and/or diagnosis of SARS-CoV-2 by FDA under an Emergency Use Authorization (EUA). This EUA will remain in effect (meaning this test can be used) for the duration of the COVID-19 declaration under Section 564(b)(1) of the Act, 21 U.S.C. section 360bbb-3(b)(1), unless the authorization is terminated or revoked.     Resp Syncytial Virus by PCR NEGATIVE NEGATIVE Final    Comment: (NOTE) Fact Sheet for Patients: BloggerCourse.com  Fact Sheet for Healthcare Providers: SeriousBroker.it  This test is not yet approved or cleared by the [place]  FDA and has been authorized for detection and/or diagnosis of SARS-CoV-2 by FDA under an Emergency Use Authorization (EUA). This EUA will remain in effect (meaning this test can be used) for the duration of the COVID-19 declaration under Section 564(b)(1) of the Act, 21 U.S.C. section 360bbb-3(b)(1), unless the authorization is terminated or revoked.  Performed at [facility], [address] [place], [place] [address]   Urine  Culture (for pregnant, neutropenic or urologic patients or patients with an indwelling urinary catheter)     Status: Abnormal   Collection Time: 12/17/23  5:10 PM   Specimen: Urine, Clean Catch  Result Value Ref Range Status   Specimen Description   Final    URINE, CLEAN CATCH Performed at [facility], [address], [place], [place] [address]    Special Requests   Final    NONE Performed at [facility], [address] [place], [place] [address]  Culture (A)  Final    <10,000 COLONIES/mL INSIGNIFICANT GROWTH Performed at [facility], [address], [place], [place] [address]    Report Status 12/19/2023 FINAL  Final  Gastrointestinal Panel by PCR , Stool     Status: None   Collection Time: 12/18/23  7:27 AM   Specimen: Stool  Result Value Ref Range Status   Campylobacter species NOT DETECTED NOT DETECTED Final   Plesimonas shigelloides NOT DETECTED NOT DETECTED Final   Salmonella species NOT DETECTED NOT DETECTED Final   Yersinia enterocolitica NOT DETECTED NOT DETECTED Final   Vibrio species NOT DETECTED NOT DETECTED Final   Vibrio cholerae NOT DETECTED NOT DETECTED Final   Enteroaggregative E coli (EAEC) NOT DETECTED NOT DETECTED Final   Enteropathogenic E coli (EPEC) NOT DETECTED NOT DETECTED Final   Enterotoxigenic E coli (ETEC) NOT DETECTED NOT DETECTED Final   Shiga like toxin producing E coli (STEC) NOT DETECTED NOT DETECTED Final   Shigella/Enteroinvasive E coli (EIEC) NOT DETECTED NOT DETECTED Final   Cryptosporidium NOT DETECTED NOT DETECTED Final   Cyclospora cayetanensis NOT DETECTED NOT DETECTED Final   Entamoeba histolytica NOT DETECTED NOT DETECTED Final   Giardia lamblia NOT DETECTED NOT DETECTED Final   Adenovirus F40/41 NOT DETECTED NOT DETECTED Final   Astrovirus NOT DETECTED NOT DETECTED Final   Norovirus GI/GII NOT DETECTED NOT DETECTED Final   Rotavirus A NOT DETECTED NOT DETECTED Final   Sapovirus (I, II, IV, and V) NOT DETECTED NOT DETECTED  Final    Comment: Performed at [facility], [address] [place], [place] [address]  C Difficile Quick Screen w PCR reflex     Status: None   Collection Time: 12/18/23  7:27 AM   Specimen: Stool  Result Value Ref Range Status   C Diff antigen NEGATIVE NEGATIVE Final   C Diff toxin NEGATIVE NEGATIVE Final   C Diff interpretation No C. difficile detected.  Final    Comment: Performed at [facility], [address], [place], [place] [address]     Scheduled Meds:  acetaminophen   1,000 mg Oral Q8H   apixaban   5 mg Oral BID   atorvastatin   40 mg Oral Daily   clonazePAM   0.5 mg Oral Daily   clonazePAM   1 mg Oral QHS   escitalopram   20 mg Oral QHS   fluticasone  furoate-vilanterol  1 puff Inhalation Daily   folic acid   1 mg Oral Daily   furosemide   40 mg Intravenous Q12H   gabapentin   600 mg Oral QHS   spironolactone   25 mg Oral Daily   vitamin B-12  500 mcg Oral Daily   Continuous Infusions:  cefTRIAXone  (ROCEPHIN )  IV 2 g (12/19/23 1736)    Procedures/Studies: ECHOCARDIOGRAM COMPLETE Result Date: 12/18/2023    ECHOCARDIOGRAM REPORT   Patient Name:   LEVONNE CARRERAS Heemstra Date of Exam: 12/18/2023 Medical Rec #:  [id number]    Height:       56.0 in Accession #:    [id number]   Weight:       268.3 lb Date of Birth:  1951/03/03    BSA:          2.011 m Patient Age:    72 years     BP:           124/57 mmHg Patient Gender: F            HR:           41 bpm. Exam  Location:  [facility] Procedure: 2D Echo, Cardiac Doppler, Color Doppler and Intracardiac            Opacification Agent (Both Spectral and Color Flow Doppler were            utilized during procedure). Indications:    Congestive Heart Failure I50.9  History:        Patient has prior history of Echocardiogram examinations, most                 recent 09/03/2022. CHF; Risk Factors:Sleep Apnea, Dyslipidemia and                 Hypertension.  Sonographer:    Aida Pizza RCS Referring Phys: [phone] TULLY BRAVO [facility]  Sonographer Comments:  Technically difficult study due to poor echo windows. Image acquisition challenging due to patient body habitus. IMPRESSIONS  1. Left ventricular ejection fraction, by estimation, is 65 to 70%. The left ventricle has normal function. The left ventricle has no regional wall motion abnormalities. Left ventricular diastolic parameters are indeterminate.  2. Right ventricular systolic function is normal. The right ventricular size is normal. Tricuspid regurgitation signal is inadequate for assessing PA pressure.  3. Left atrial size was mildly dilated.  4. The mitral valve is normal in structure. No evidence of mitral valve regurgitation. No evidence of mitral stenosis.  5. The aortic valve is tricuspid. Aortic valve regurgitation is not visualized. No aortic stenosis is present.  6. The inferior vena cava is normal in size with greater than 50% respiratory variability, suggesting right atrial pressure of 3 mmHg. FINDINGS  Left Ventricle: Left ventricular ejection fraction, by estimation, is 65 to 70%. The left ventricle has normal function. The left ventricle has no regional wall motion abnormalities. Definity  contrast agent was given IV to delineate the left ventricular  endocardial borders. The left ventricular internal cavity size was normal in size. There is no left ventricular hypertrophy. Left ventricular diastolic parameters are indeterminate. Right Ventricle: The right ventricular size is normal. Right vetricular wall thickness was not well visualized. Right ventricular systolic function is normal. Tricuspid regurgitation signal is inadequate for assessing PA pressure. Left Atrium: Left atrial size was mildly dilated. Right Atrium: Right atrial size was normal in size. Pericardium: There is no evidence of pericardial effusion. Mitral Valve: The mitral valve is normal in structure. No evidence of mitral valve regurgitation. No evidence of mitral valve stenosis. Tricuspid Valve: The tricuspid valve is not well  visualized. Tricuspid valve regurgitation is not demonstrated. No evidence of tricuspid stenosis. Aortic Valve: The aortic valve is tricuspid. Aortic valve regurgitation is not visualized. No aortic stenosis is present. Aortic valve mean gradient measures 11.0 mmHg. Aortic valve peak gradient measures 22.6 mmHg. Aortic valve area, by VTI measures 1.88 cm. Pulmonic Valve: The pulmonic valve was not well visualized. Pulmonic valve regurgitation is not visualized. No evidence of pulmonic stenosis. Aorta: The aortic root is normal in size and structure and the ascending aorta was not well visualized. Venous: The inferior vena cava is normal in size with greater than 50% respiratory variability, suggesting right atrial pressure of 3 mmHg. IAS/Shunts: The interatrial septum was not well visualized.  LEFT VENTRICLE PLAX 2D LVIDd:         5.20 cm   Diastology LVIDs:         2.70 cm   LV e' medial:    9.08 cm/s LV PW:         0.90 cm   LV E/e'  medial:  12.4 LV IVS:        1.00 cm   LV e' lateral:   9.20 cm/s LVOT diam:     2.00 cm   LV E/e' lateral: 12.3 LV SV:         93 LV SV Index:   46 LVOT Area:     3.14 cm  RIGHT VENTRICLE RV S prime:     15.20 cm/s TAPSE (M-mode): 2.8 cm LEFT ATRIUM              Index LA diam:        4.20 cm  2.09 cm/m LA Vol (A2C):   50.3 ml  25.01 ml/m LA Vol (A4C):   104.0 ml 51.71 ml/m LA Biplane Vol: 77.7 ml  38.63 ml/m  AORTIC VALVE AV Area (Vmax):    1.39 cm AV Area (Vmean):   1.41 cm AV Area (VTI):     1.88 cm AV Vmax:           237.50 cm/s AV Vmean:          149.000 cm/s AV VTI:            0.494 m AV Peak Grad:      22.6 mmHg AV Mean Grad:      11.0 mmHg LVOT Vmax:         105.00 cm/s LVOT Vmean:        67.000 cm/s LVOT VTI:          0.296 m LVOT/AV VTI ratio: 0.60  AORTA Ao Root diam: 3.30 cm MITRAL VALVE MV Area (PHT): 1.84 cm     SHUNTS MV Decel Time: 412 msec     Systemic VTI:  0.30 m MV E velocity: 113.00 cm/s  Systemic Diam: 2.00 cm MV A velocity: 98.50 cm/s MV E/A ratio:   1.15 Dorn Ross MD Electronically signed by Dorn Ross MD Signature Date/Time: 12/18/2023/12:18:15 PM    Final    US  ARTERIAL ABI (SCREENING LOWER EXTREMITY) Result Date: 12/18/2023 CLINICAL DATA:  Leg swelling EXAM: NONINVASIVE PHYSIOLOGIC VASCULAR STUDY OF BILATERAL LOWER EXTREMITIES TECHNIQUE: Evaluation of both lower extremities were performed at rest, including calculation of ankle-brachial indices with single level Doppler, pressure and pulse volume recording. COMPARISON:  None Available. FINDINGS: Right ABI:  1.0 Left ABI:  1.0 Right Lower Extremity:  Normal arterial waveforms at the ankle. Left Lower Extremity:  Normal arterial waveforms at the ankle. 1.0-1.4 Normal IMPRESSION: 1. ABIs are within normal limits. No evidence of significant peripheral arterial disease. Electronically Signed   By: Maude Naegeli M.D.   On: 12/18/2023 11:18   CT Head Wo Contrast Result Date: 12/17/2023 CLINICAL DATA:  Hypoxia, bradycardia, altered level of consciousness, delirium EXAM: CT HEAD WITHOUT CONTRAST TECHNIQUE: Contiguous axial images were obtained from the base of the skull through the vertex without intravenous contrast. RADIATION DOSE REDUCTION: This exam was performed according to the departmental dose-optimization program which includes automated exposure control, adjustment of the mA and/or kV according to patient size and/or use of iterative reconstruction technique. COMPARISON:  None Available. FINDINGS: Brain: No acute infarct or hemorrhage. Lateral ventricles and midline structures are unremarkable. No acute extra-axial fluid collections. No mass effect. Vascular: No hyperdense vessel or unexpected calcification. Skull: Normal. Negative for fracture or focal lesion. Sinuses/Orbits: No acute finding. Other: None. IMPRESSION: 1. No acute intracranial process. Electronically Signed   By: Ozell Daring M.D.   On: 12/17/2023 16:25   DG Chest Port 1 View Result Date:  12/17/2023 CLINICAL DATA:   Bradycardia, altered mental status, and hypoxia EXAM: PORTABLE CHEST - 1 VIEW COMPARISON:  September 01, 2022 FINDINGS: Bilateral perihilar interstitial opacities. Hazy airspace opacities in both lung bases. Blunting of the right costophrenic sulcus. No pneumothorax. Moderate cardiomegaly. Tortuous aorta with aortic atherosclerosis. Multilevel thoracic osteophytosis. IMPRESSION: Moderate cardiomegaly with findings of pulmonary edema and likely small to moderate right pleural effusion. Electronically Signed   By: Rogelia Myers M.D.   On: 12/17/2023 15:57    Alm Schneider, DO  [facility]  If 7PM-7AM, please contact night-coverage [email or web address] Password [facility] 12/20/2023, 3:37 PM   LOS: 3 days

## 2023-12-21 DIAGNOSIS — J9611 Chronic respiratory failure with hypoxia: Secondary | ICD-10-CM | POA: Diagnosis not present

## 2023-12-21 DIAGNOSIS — I5033 Acute on chronic diastolic (congestive) heart failure: Secondary | ICD-10-CM | POA: Diagnosis not present

## 2023-12-21 DIAGNOSIS — I2510 Atherosclerotic heart disease of native coronary artery without angina pectoris: Secondary | ICD-10-CM | POA: Diagnosis not present

## 2023-12-21 DIAGNOSIS — I10 Essential (primary) hypertension: Secondary | ICD-10-CM | POA: Diagnosis not present

## 2023-12-21 DIAGNOSIS — I480 Paroxysmal atrial fibrillation: Secondary | ICD-10-CM | POA: Diagnosis not present

## 2023-12-21 DIAGNOSIS — J9612 Chronic respiratory failure with hypercapnia: Secondary | ICD-10-CM | POA: Diagnosis not present

## 2023-12-21 LAB — MAGNESIUM: Magnesium: 2.3 mg/dL (ref 1.7–2.4)

## 2023-12-21 LAB — BASIC METABOLIC PANEL WITH GFR
Anion gap: 10 (ref 5–15)
BUN: 18 mg/dL (ref 8–23)
CO2: 33 mmol/L — ABNORMAL HIGH (ref 22–32)
Calcium: 8.9 mg/dL (ref 8.9–10.3)
Chloride: 95 mmol/L — ABNORMAL LOW (ref 98–111)
Creatinine, Ser: 0.63 mg/dL (ref 0.44–1.00)
GFR, Estimated: >60 mL/min (ref >60)
Glucose, Bld: 132 mg/dL — ABNORMAL HIGH (ref 70–99)
Potassium: 4 mmol/L (ref 3.5–5.1)
Sodium: 138 mmol/L (ref 135–145)

## 2023-12-21 LAB — CBC
HCT: 44.1 % (ref 36.0–46.0)
Hemoglobin: 14.6 g/dL (ref 12.0–15.0)
MCH: 32.2 pg (ref 26.0–34.0)
MCHC: 33.1 g/dL (ref 30.0–36.0)
MCV: 97.4 fL (ref 80.0–100.0)
Platelets: 102 K/uL — ABNORMAL LOW (ref 150–400)
RBC: 4.53 MIL/uL (ref 3.87–5.11)
RDW: 14.9 % (ref 11.5–15.5)
WBC: 4 K/uL (ref 4.0–10.5)
nRBC: 0 % (ref 0.0–0.2)

## 2023-12-21 MED ORDER — APIXABAN 5 MG PO TABS: 5.0000 mg | ORAL_TABLET | Freq: Two times a day (BID) | ORAL | 1 refills | Status: AC

## 2023-12-21 MED ORDER — TRAMADOL HCL 50 MG PO TABS: 50.0000 mg | ORAL_TABLET | Freq: Four times a day (QID) | ORAL | 0 refills | Status: AC | PRN

## 2023-12-21 MED ORDER — CYANOCOBALAMIN 500 MCG PO TABS: 500.0000 ug | ORAL_TABLET | Freq: Every day | ORAL | Status: AC

## 2023-12-21 MED ORDER — METOPROLOL SUCCINATE ER 25 MG PO TB24: 12.5000 mg | ORAL_TABLET | Freq: Every day | ORAL | 1 refills | Status: AC

## 2023-12-21 MED ORDER — CEFADROXIL 500 MG PO CAPS: 500.0000 mg | ORAL_CAPSULE | Freq: Two times a day (BID) | ORAL | 0 refills | Status: AC

## 2023-12-21 MED ORDER — METOPROLOL SUCCINATE ER 25 MG PO TB24
12.5000 mg | ORAL_TABLET | Freq: Every day | ORAL | Status: DC
  Administered 2023-12-21: 12.5 mg via ORAL
  Filled 2023-12-21: qty 1

## 2023-12-21 MED ORDER — FOLIC ACID 1 MG PO TABS: 1.0000 mg | ORAL_TABLET | Freq: Every day | ORAL | Status: AC

## 2023-12-21 NOTE — Care Management Important Message (Signed)
 Important Message  Patient Details  Name: Jenna Sherman MRN: [id number] Date of Birth: Apr 05, 1950   Important Message Given:  Yes - Medicare IM     Lowanda Cashaw L Jamiah Recore 12/21/2023, 11:40 AM

## 2023-12-21 NOTE — Discharge Summary (Signed)
 Physician Discharge Summary   Patient: Jenna Sherman MRN: [id number] DOB: 07/02/1950  Admit date:     12/17/2023  Discharge date: 12/21/23  Discharge Physician: Alm Versie Soave   PCP: Atilano Deward ORN, MD   Recommendations at discharge:   Please follow up with primary care provider within 1-2 weeks  Please repeat BMP and CBC in one week     Hospital Course: 73 year old female with a history of hypertension, hyperlipidemia, HFpEF, hearing loss, OSA, chronic respiratory failure on 3 L, obesity presenting with altered mental status and worsening shortness of breath. The patient is a difficult historian secondary to her.  Impairment and some confusion.  History is supplemented by the patient's daughter at the bedside.  According to the patient's daughter, the patient has been having some intermittent confusion for the past 2 to 3 weeks, worsened over the past 2 to 3 days.  In addition, the patient has chronic dyspnea on exertion which is worse over the past 2 to 3 days.  There has been some increase in her lower extremity edema.  At baseline, the patient has chronic orthopnea.  There is been no change in her medications.  There is been no fever, chills, chest pain, nausea, vomiting.  There is been some loose stools.  There is no hematochezia, hematuria although there has been some dysuria. At baseline, the patient is able to transfer and get to the bedside commode and take a few steps.  She uses a walker.  Daughter states that the patient frequently takes her oxygen  off during the daytime even though she is supposed to be wearing it 24/7. In the ED, the patient had a low-grade temperature 99.1 F.  She was hemodynamically stable with oxygen  saturation in the 70s on room air.  She was placed on 4 L with saturation up to 99%.  WBC 5.0, hemoglobin 13.5, platelets 115.  BMP 247.  Troponin 5 >> 5.  Sodium 140, potassium 4.2, bicarbonate 32, serum creatinine 0.5.  LFTs unremarkable.  Chest x-ray showed vascular  congestion.  Patient was started IV furosemide .  CT of the brain was negative for acute findings.  UA showed >50 WBC   Assessment and Plan:  Acute on chronic HFpEF - 09/03/2022 echo EF 60-65%, grade 1 DD, normal RVF - 12/19/23 Echo EF 65-70%, no WMA, normal RVF - Continue IV furosemide >>po lasix  on 12/21/23 - Daily weights--neg 8 lbs - Accurate I's and O's--NEG 6.3L   Chronic respiratory failure with hypoxia and hypercarbia - 12/17/2023 VBG 7.40 8/72/34/47 - Stable on 3 L - COVID/RSV/Flu neg   New onset atrial fibrillation - Patient has converted back to sinus rhythm - now in sinus bradycardia - continue apixaban  - CHADSVASc = 5 (CHF, HTN, Age, female, ASVD) - d/c home with metoprolol  succinate 12.5 mg daily - cardiology planning zio patch after dc home   UTI -UA >50 WBC - urine culture unrevealing -received ceftriaxone  x 3 days -cefadroxil  x 2 days   Acute metabolic encephalopathy - Secondary to infectious process and hypoxia - CT brain negative -UA>50 WBC -9/20--mental status back to baseline   Diarrhea - Stool pathogen panel--pending - Check C. Difficile--neg   Thrombocytopenia - chronic dating back to 08/2021 -B12--293 -folate--7.6 -TSH 3.669   Essential hypertension - Holding metoprolol  secondary to bradycardia initially - Reviewed telemetry and EKG--sinus pericardia, no high-grade AV block - d/c home with metoprolol  succinate 12.5 mg daily - continue spironolacdtone   Mixed hyperlipidemia - Continue statin   OSA - Patient does  not use CPAP   Anxiety - Continue clonazepam  and Lexapro  - PDMP reviewed-- -clonazepam , 0.5 mg, #90, last refill 12/01/2023   Morbid obesity - BMI 60.15 - Lifestyle modification      Consultants: cardiology Procedures performed: none  Disposition: Home Diet recommendation:  Cardiac diet DISCHARGE MEDICATION: Allergies as of 12/21/2023       Reactions   Ivp Dye [iodinated Contrast Media] Shortness Of Breath         Medication List     STOP taking these medications    aspirin  EC 81 MG tablet       TAKE these medications    apixaban  5 MG Tabs tablet Commonly known as: ELIQUIS  Take 1 tablet (5 mg total) by mouth 2 (two) times daily.   atorvastatin  40 MG tablet Commonly known as: LIPITOR Take 1 tablet (40 mg total) by mouth daily.   Breo Ellipta  200-25 MCG/ACT Aepb Generic drug: fluticasone  furoate-vilanterol Inhale 1 puff into the lungs daily.   cefadroxil  500 MG capsule Commonly known as: DURICEF Take 1 capsule (500 mg total) by mouth 2 (two) times daily.   cetirizine 10 MG tablet Commonly known as: ZYRTEC Take 1 tablet by mouth daily.   clonazePAM  0.5 MG tablet Commonly known as: KLONOPIN  Take 0.5 mg by mouth 2 (two) times daily. 0.5 mg am, 1 mg pm   cyanocobalamin  500 MCG tablet Commonly known as: VITAMIN B12 Take 1 tablet (500 mcg total) by mouth daily. Start taking on: December 22, 2023   diclofenac  75 MG EC tablet Commonly known as: VOLTAREN  Take 1 tablet (75 mg total) by mouth every 12 (twelve) hours as needed. What changed: reasons to take this   escitalopram  20 MG tablet Commonly known as: LEXAPRO  Take 20 mg by mouth at bedtime.   folic acid  1 MG tablet Commonly known as: FOLVITE  Take 1 tablet (1 mg total) by mouth daily. Start taking on: December 22, 2023   furosemide  40 MG tablet Commonly known as: LASIX  Take 1 tablet (40 mg total) by mouth daily. Start taking from tomorrow   gabapentin  300 MG capsule Commonly known as: NEURONTIN  Take 600 mg by mouth at bedtime.   meclizine 25 MG tablet Commonly known as: ANTIVERT Take 25 mg by mouth 3 (three) times daily as needed for dizziness.   metoprolol  succinate 25 MG 24 hr tablet Commonly known as: TOPROL -XL Take 0.5 tablets (12.5 mg total) by mouth daily. What changed:  medication strength how much to take   Myrbetriq 25 MG Tb24 tablet Generic drug: mirabegron ER Take 25 mg by mouth daily.    nitroGLYCERIN  0.4 MG SL tablet Commonly known as: NITROSTAT  Place 1 tablet (0.4 mg total) under the tongue every 5 (five) minutes x 3 doses as needed for chest pain (if no relief after 3rd dose, proceed to ED for evalution or call 911).   Nyamyc  powder Generic drug: nystatin  Apply 1 Application topically.   nystatin  powder Commonly known as: MYCOSTATIN /NYSTOP  Apply topically 2 (two) times daily. Apply on abdominal folds   spironolactone  25 MG tablet Commonly known as: ALDACTONE  Take 1 tablet (25 mg total) by mouth daily.   traMADol  50 MG tablet Commonly known as: ULTRAM  Take 1 tablet (50 mg total) by mouth every 6 (six) hours as needed for moderate pain (pain score 4-6).               Durable Medical Equipment  (From admission, onward)           Start  Ordered   12/21/23 1210  For home use only DME Other see comment  Once       Comments: POC Eval  Question:  Length of Need  Answer:  Lifetime   12/21/23 1209            Follow-up Information     [facility] Follow up.   Why: PT will call to schedule your next home visit.               Discharge Exam: Filed Weights   12/19/23 0300 12/20/23 0500 12/21/23 0555  Weight: 121.8 kg 117.8 kg 120.7 kg   HEENT:  [place]/AT, No thrush, no icterus CV:  RRR, no rub, no S3, no S4 Lung:  fine basilar crackles. No wheeze Abd:  soft/+BS, NT Ext:  No edema, no lymphangitis, no synovitis, no rash   Condition at discharge: stable  The results of significant diagnostics from this hospitalization (including imaging, microbiology, ancillary and laboratory) are listed below for reference.   Imaging Studies: ECHOCARDIOGRAM COMPLETE Result Date: 12/18/2023    ECHOCARDIOGRAM REPORT   Patient Name:   BRIGHTEN BUZZELLI Kondracki Date of Exam: 12/18/2023 Medical Rec #:  [id number]    Height:       56.0 in Accession #:    [id number]   Weight:       268.3 lb Date of Birth:  17-Jan-1951    BSA:          2.011 m Patient Age:    72  years     BP:           124/57 mmHg Patient Gender: F            HR:           41 bpm. Exam Location:  [facility] Procedure: 2D Echo, Cardiac Doppler, Color Doppler and Intracardiac            Opacification Agent (Both Spectral and Color Flow Doppler were            utilized during procedure). Indications:    Congestive Heart Failure I50.9  History:        Patient has prior history of Echocardiogram examinations, most                 recent 09/03/2022. CHF; Risk Factors:Sleep Apnea, Dyslipidemia and                 Hypertension.  Sonographer:    Aida Pizza RCS Referring Phys: [phone] TULLY BRAVO [facility]  Sonographer Comments: Technically difficult study due to poor echo windows. Image acquisition challenging due to patient body habitus. IMPRESSIONS  1. Left ventricular ejection fraction, by estimation, is 65 to 70%. The left ventricle has normal function. The left ventricle has no regional wall motion abnormalities. Left ventricular diastolic parameters are indeterminate.  2. Right ventricular systolic function is normal. The right ventricular size is normal. Tricuspid regurgitation signal is inadequate for assessing PA pressure.  3. Left atrial size was mildly dilated.  4. The mitral valve is normal in structure. No evidence of mitral valve regurgitation. No evidence of mitral stenosis.  5. The aortic valve is tricuspid. Aortic valve regurgitation is not visualized. No aortic stenosis is present.  6. The inferior vena cava is normal in size with greater than 50% respiratory variability, suggesting right atrial pressure of 3 mmHg. FINDINGS  Left Ventricle: Left ventricular ejection fraction, by estimation, is 65 to 70%. The left ventricle has normal function. The left  ventricle has no regional wall motion abnormalities. Definity  contrast agent was given IV to delineate the left ventricular  endocardial borders. The left ventricular internal cavity size was normal in size. There is no left ventricular hypertrophy.  Left ventricular diastolic parameters are indeterminate. Right Ventricle: The right ventricular size is normal. Right vetricular wall thickness was not well visualized. Right ventricular systolic function is normal. Tricuspid regurgitation signal is inadequate for assessing PA pressure. Left Atrium: Left atrial size was mildly dilated. Right Atrium: Right atrial size was normal in size. Pericardium: There is no evidence of pericardial effusion. Mitral Valve: The mitral valve is normal in structure. No evidence of mitral valve regurgitation. No evidence of mitral valve stenosis. Tricuspid Valve: The tricuspid valve is not well visualized. Tricuspid valve regurgitation is not demonstrated. No evidence of tricuspid stenosis. Aortic Valve: The aortic valve is tricuspid. Aortic valve regurgitation is not visualized. No aortic stenosis is present. Aortic valve mean gradient measures 11.0 mmHg. Aortic valve peak gradient measures 22.6 mmHg. Aortic valve area, by VTI measures 1.88 cm. Pulmonic Valve: The pulmonic valve was not well visualized. Pulmonic valve regurgitation is not visualized. No evidence of pulmonic stenosis. Aorta: The aortic root is normal in size and structure and the ascending aorta was not well visualized. Venous: The inferior vena cava is normal in size with greater than 50% respiratory variability, suggesting right atrial pressure of 3 mmHg. IAS/Shunts: The interatrial septum was not well visualized.  LEFT VENTRICLE PLAX 2D LVIDd:         5.20 cm   Diastology LVIDs:         2.70 cm   LV e' medial:    9.08 cm/s LV PW:         0.90 cm   LV E/e' medial:  12.4 LV IVS:        1.00 cm   LV e' lateral:   9.20 cm/s LVOT diam:     2.00 cm   LV E/e' lateral: 12.3 LV SV:         93 LV SV Index:   46 LVOT Area:     3.14 cm  RIGHT VENTRICLE RV S prime:     15.20 cm/s TAPSE (M-mode): 2.8 cm LEFT ATRIUM              Index LA diam:        4.20 cm  2.09 cm/m LA Vol (A2C):   50.3 ml  25.01 ml/m LA Vol (A4C):   104.0  ml 51.71 ml/m LA Biplane Vol: 77.7 ml  38.63 ml/m  AORTIC VALVE AV Area (Vmax):    1.39 cm AV Area (Vmean):   1.41 cm AV Area (VTI):     1.88 cm AV Vmax:           237.50 cm/s AV Vmean:          149.000 cm/s AV VTI:            0.494 m AV Peak Grad:      22.6 mmHg AV Mean Grad:      11.0 mmHg LVOT Vmax:         105.00 cm/s LVOT Vmean:        67.000 cm/s LVOT VTI:          0.296 m LVOT/AV VTI ratio: 0.60  AORTA Ao Root diam: 3.30 cm MITRAL VALVE MV Area (PHT): 1.84 cm     SHUNTS MV Decel Time: 412 msec     Systemic VTI:  0.30 m MV E velocity: 113.00 cm/s  Systemic Diam: 2.00 cm MV A velocity: 98.50 cm/s MV E/A ratio:  1.15 Dorn Ross MD Electronically signed by Dorn Ross MD Signature Date/Time: 12/18/2023/12:18:15 PM    Final    US  ARTERIAL ABI (SCREENING LOWER EXTREMITY) Result Date: 12/18/2023 CLINICAL DATA:  Leg swelling EXAM: NONINVASIVE PHYSIOLOGIC VASCULAR STUDY OF BILATERAL LOWER EXTREMITIES TECHNIQUE: Evaluation of both lower extremities were performed at rest, including calculation of ankle-brachial indices with single level Doppler, pressure and pulse volume recording. COMPARISON:  None Available. FINDINGS: Right ABI:  1.0 Left ABI:  1.0 Right Lower Extremity:  Normal arterial waveforms at the ankle. Left Lower Extremity:  Normal arterial waveforms at the ankle. 1.0-1.4 Normal IMPRESSION: 1. ABIs are within normal limits. No evidence of significant peripheral arterial disease. Electronically Signed   By: Maude Naegeli M.D.   On: 12/18/2023 11:18   CT Head Wo Contrast Result Date: 12/17/2023 CLINICAL DATA:  Hypoxia, bradycardia, altered level of consciousness, delirium EXAM: CT HEAD WITHOUT CONTRAST TECHNIQUE: Contiguous axial images were obtained from the base of the skull through the vertex without intravenous contrast. RADIATION DOSE REDUCTION: This exam was performed according to the departmental dose-optimization program which includes automated exposure control, adjustment of the mA  and/or kV according to patient size and/or use of iterative reconstruction technique. COMPARISON:  None Available. FINDINGS: Brain: No acute infarct or hemorrhage. Lateral ventricles and midline structures are unremarkable. No acute extra-axial fluid collections. No mass effect. Vascular: No hyperdense vessel or unexpected calcification. Skull: Normal. Negative for fracture or focal lesion. Sinuses/Orbits: No acute finding. Other: None. IMPRESSION: 1. No acute intracranial process. Electronically Signed   By: Ozell Daring M.D.   On: 12/17/2023 16:25   DG Chest Port 1 View Result Date: 12/17/2023 CLINICAL DATA:  Bradycardia, altered mental status, and hypoxia EXAM: PORTABLE CHEST - 1 VIEW COMPARISON:  September 01, 2022 FINDINGS: Bilateral perihilar interstitial opacities. Hazy airspace opacities in both lung bases. Blunting of the right costophrenic sulcus. No pneumothorax. Moderate cardiomegaly. Tortuous aorta with aortic atherosclerosis. Multilevel thoracic osteophytosis. IMPRESSION: Moderate cardiomegaly with findings of pulmonary edema and likely small to moderate right pleural effusion. Electronically Signed   By: Rogelia Myers M.D.   On: 12/17/2023 15:57    Microbiology: Results for orders placed or performed during the hospital encounter of 12/17/23  Resp panel by RT-PCR (RSV, Flu A&B, Covid) Anterior Nasal Swab     Status: None   Collection Time: 12/17/23  3:55 PM   Specimen: Anterior Nasal Swab  Result Value Ref Range Status   SARS Coronavirus 2 by RT PCR NEGATIVE NEGATIVE Final    Comment: (NOTE) SARS-CoV-2 target nucleic acids are NOT DETECTED.  The SARS-CoV-2 RNA is generally detectable in upper respiratory specimens during the acute phase of infection. The lowest concentration of SARS-CoV-2 viral copies this assay can detect is 138 copies/mL. A negative result does not preclude SARS-Cov-2 infection and should not be used as the sole basis for treatment or other patient management  decisions. A negative result may occur with  improper specimen collection/handling, submission of specimen other than nasopharyngeal swab, presence of viral mutation(s) within the areas targeted by this assay, and inadequate number of viral copies(<138 copies/mL). A negative result must be combined with clinical observations, patient history, and epidemiological information. The expected result is Negative.  Fact Sheet for Patients:  BloggerCourse.com  Fact Sheet for Healthcare Providers:  SeriousBroker.it  This test is no t yet approved or cleared by the  [place]  FDA and  has been authorized for detection and/or diagnosis of SARS-CoV-2 by FDA under an Emergency Use Authorization (EUA). This EUA will remain  in effect (meaning this test can be used) for the duration of the COVID-19 declaration under Section 564(b)(1) of the Act, 21 U.S.C.section 360bbb-3(b)(1), unless the authorization is terminated  or revoked sooner.       Influenza A by PCR NEGATIVE NEGATIVE Final   Influenza B by PCR NEGATIVE NEGATIVE Final    Comment: (NOTE) The Xpert Xpress SARS-CoV-2/FLU/RSV plus assay is intended as an aid in the diagnosis of influenza from Nasopharyngeal swab specimens and should not be used as a sole basis for treatment. Nasal washings and aspirates are unacceptable for Xpert Xpress SARS-CoV-2/FLU/RSV testing.  Fact Sheet for Patients: BloggerCourse.com  Fact Sheet for Healthcare Providers: SeriousBroker.it  This test is not yet approved or cleared by the [place]  FDA and has been authorized for detection and/or diagnosis of SARS-CoV-2 by FDA under an Emergency Use Authorization (EUA). This EUA will remain in effect (meaning this test can be used) for the duration of the COVID-19 declaration under Section 564(b)(1) of the Act, 21 U.S.C. section 360bbb-3(b)(1), unless the  authorization is terminated or revoked.     Resp Syncytial Virus by PCR NEGATIVE NEGATIVE Final    Comment: (NOTE) Fact Sheet for Patients: BloggerCourse.com  Fact Sheet for Healthcare Providers: SeriousBroker.it  This test is not yet approved or cleared by the [place]  FDA and has been authorized for detection and/or diagnosis of SARS-CoV-2 by FDA under an Emergency Use Authorization (EUA). This EUA will remain in effect (meaning this test can be used) for the duration of the COVID-19 declaration under Section 564(b)(1) of the Act, 21 U.S.C. section 360bbb-3(b)(1), unless the authorization is terminated or revoked.  Performed at [facility], [address] [place], [place] [address]   Urine Culture (for pregnant, neutropenic or urologic patients or patients with an indwelling urinary catheter)     Status: Abnormal   Collection Time: 12/17/23  5:10 PM   Specimen: Urine, Clean Catch  Result Value Ref Range Status   Specimen Description   Final    URINE, CLEAN CATCH Performed at [facility], [address], [place], [place] [address]    Special Requests   Final    NONE Performed at [facility], [address] [place], [place] [address]    Culture (A)  Final    <10,000 COLONIES/mL INSIGNIFICANT GROWTH Performed at [facility], [address], [place], [place] [address]    Report Status 12/19/2023 FINAL  Final  Gastrointestinal Panel by PCR , Stool     Status: None   Collection Time: 12/18/23  7:27 AM   Specimen: Stool  Result Value Ref Range Status   Campylobacter species NOT DETECTED NOT DETECTED Final   Plesimonas shigelloides NOT DETECTED NOT DETECTED Final   Salmonella species NOT DETECTED NOT DETECTED Final   Yersinia enterocolitica NOT DETECTED NOT DETECTED Final   Vibrio species NOT DETECTED NOT DETECTED Final   Vibrio cholerae NOT DETECTED NOT DETECTED Final   Enteroaggregative E coli (EAEC) NOT  DETECTED NOT DETECTED Final   Enteropathogenic E coli (EPEC) NOT DETECTED NOT DETECTED Final   Enterotoxigenic E coli (ETEC) NOT DETECTED NOT DETECTED Final   Shiga like toxin producing E coli (STEC) NOT DETECTED NOT DETECTED Final   Shigella/Enteroinvasive E coli (EIEC) NOT DETECTED NOT DETECTED Final   Cryptosporidium NOT DETECTED NOT DETECTED Final  Cyclospora cayetanensis NOT DETECTED NOT DETECTED Final   Entamoeba histolytica NOT DETECTED NOT DETECTED Final   Giardia lamblia NOT DETECTED NOT DETECTED Final   Adenovirus F40/41 NOT DETECTED NOT DETECTED Final   Astrovirus NOT DETECTED NOT DETECTED Final   Norovirus GI/GII NOT DETECTED NOT DETECTED Final   Rotavirus A NOT DETECTED NOT DETECTED Final   Sapovirus (I, II, IV, and V) NOT DETECTED NOT DETECTED Final    Comment: Performed at [facility], [address], [place], [place] [address]  C Difficile Quick Screen w PCR reflex     Status: None   Collection Time: 12/18/23  7:27 AM   Specimen: Stool  Result Value Ref Range Status   C Diff antigen NEGATIVE NEGATIVE Final   C Diff toxin NEGATIVE NEGATIVE Final   C Diff interpretation No C. difficile detected.  Final    Comment: Performed at [facility], [address], [place], [place] [address]  Respiratory (~20 pathogens) panel by PCR     Status: None   Collection Time: 12/19/23  1:45 PM   Specimen: Nasopharyngeal Swab; Respiratory  Result Value Ref Range Status   Adenovirus NOT DETECTED NOT DETECTED Final   Coronavirus 229E NOT DETECTED NOT DETECTED Final    Comment: (NOTE) The Coronavirus on the Respiratory Panel, DOES NOT test for the novel  Coronavirus (2019 nCoV)    Coronavirus HKU1 NOT DETECTED NOT DETECTED Final   Coronavirus NL63 NOT DETECTED NOT DETECTED Final   Coronavirus OC43 NOT DETECTED NOT DETECTED Final   Metapneumovirus NOT DETECTED NOT DETECTED Final   Rhinovirus / Enterovirus NOT DETECTED NOT DETECTED Final   Influenza A NOT DETECTED NOT DETECTED  Final   Influenza B NOT DETECTED NOT DETECTED Final   Parainfluenza Virus 1 NOT DETECTED NOT DETECTED Final   Parainfluenza Virus 2 NOT DETECTED NOT DETECTED Final   Parainfluenza Virus 3 NOT DETECTED NOT DETECTED Final   Parainfluenza Virus 4 NOT DETECTED NOT DETECTED Final   Respiratory Syncytial Virus NOT DETECTED NOT DETECTED Final   Bordetella pertussis NOT DETECTED NOT DETECTED Final   Bordetella Parapertussis NOT DETECTED NOT DETECTED Final   Chlamydophila pneumoniae NOT DETECTED NOT DETECTED Final   Mycoplasma pneumoniae NOT DETECTED NOT DETECTED Final    Comment: Performed at [facility], [address], [place], [place] [address]    Labs: CBC: Recent Labs  Lab 12/17/23 1529 12/17/23 1548 12/18/23 0301 12/21/23 0436  WBC 4.7  --  5.0 4.0  NEUTROABS 2.8  --   --   --   HGB 13.8 13.9 13.5 14.6  HCT 42.1 41.0 42.7 44.1  MCV 99.1  --  99.3 97.4  PLT 111*  --  115* 102*   Basic Metabolic Panel: Recent Labs  Lab 12/17/23 1529 12/17/23 1548 12/17/23 1841 12/18/23 0301 12/19/23 0320 12/20/23 0347 12/21/23 0436  NA 140 139  --  141 140 140 138  K 4.2 4.2  --  3.3* 3.9 3.7 4.0  CL 95* 94*  --  93* 92* 91* 95*  CO2 32  --   --  37* 36* 37* 33*  GLUCOSE 93 93  --  98 103* 93 132*  BUN 13 13  --  14 17 15 18   CREATININE 0.65 0.80  --  0.71 0.70 0.63 0.63  CALCIUM  8.7*  --   --  8.4* 8.4* 8.8* 8.9  MG  --   --  2.0  --   --  2.2 2.3  Liver Function Tests: Recent Labs  Lab 12/17/23 1529  AST 25  ALT 18  ALKPHOS 50  BILITOT 1.8*  PROT 7.1  ALBUMIN  3.4*   CBG: Recent Labs  Lab 12/17/23 1518 12/18/23 0026 12/18/23 0305  GLUCAP 91 103* 98    Discharge time spent: greater than 30 minutes.  Signed: Alm Schneider, MD [facility] 12/21/2023

## 2023-12-21 NOTE — Progress Notes (Signed)
 Rounding Note   Patient Name: Jenna Sherman Date of Encounter: 12/21/2023  [facility] Cardiologist: Alvan Carrier, MD   Subjective Patient is a difficult historian secondary to her confusion, however denies any chest pain, dizziness.  Patient does report noticing palpitations over the weekend and still currently but not able to describe any further.  Reports improvement in SOB and LE edema.  Also notes coughing up phlegm but not able to describe color.  Scheduled Meds:  acetaminophen   1,000 mg Oral Q8H   apixaban   5 mg Oral BID   atorvastatin   40 mg Oral Daily   cefadroxil   500 mg Oral BID   clonazePAM   0.5 mg Oral Daily   clonazePAM   1 mg Oral QHS   escitalopram   20 mg Oral QHS   fluticasone  furoate-vilanterol  1 puff Inhalation Daily   folic acid   1 mg Oral Daily   furosemide   40 mg Oral Daily   gabapentin   600 mg Oral QHS   polyethylene glycol  17 g Oral Daily   spironolactone   25 mg Oral Daily   vitamin B-12  500 mcg Oral Daily   Continuous Infusions:  PRN Meds: ondansetron  **OR** ondansetron  (ZOFRAN ) IV, polyethylene glycol, traMADol    Vital Signs  Vitals:   12/20/23 1320 12/20/23 2021 12/21/23 0555 12/21/23 0723  BP: (!) 116/56 120/75 (!) 148/64   Pulse: 61 92 60   Resp: 18 17 18    Temp: 98.4 F (36.9 C) 98.9 F (37.2 C) 98.2 F (36.8 C)   TempSrc: Oral Oral Axillary   SpO2: 97% 94% 97% 94%  Weight:   120.7 kg   Height:        Intake/Output Summary (Last 24 hours) at 12/21/2023 0909 Last data filed at 12/21/2023 0556 Gross per 24 hour  Intake --  Output 2500 ml  Net -2500 ml      12/21/2023    5:55 AM 12/20/2023    5:00 AM 12/19/2023    3:00 AM  Last 3 Weights  Weight (lbs) 266 lb 1.5 oz 259 lb 11.2 oz 268 lb 8.3 oz  Weight (kg) 120.7 kg 117.8 kg 121.8 kg      Telemetry Mainly NSR with HR 50-70's with episodes of afib 9/21 1530 to 9/22 230 with HR in 90's then  back to NSR. Currently back in afib since 9/22 630 am with HR in 90's.   personally Reviewed   Physical Exam GEN: No acute distress. Sitting in bedside recliner. Wearing [place].  Neck: JVD difficult to assess due to body habitus.  Cardiac: irreg, irreg; no murmurs, rubs, or gallops.  Respiratory: diminished lung sound in bases. GI: Soft, nontender, non-distended  MS: Trace edema bilaterally; No deformity. Neuro:  Nonfocal  Psych: Normal affect   Labs High Sensitivity Troponin:   Recent Labs  Lab 12/17/23 1529 12/17/23 1841  TROPONINIHS 5 5     Chemistry Recent Labs  Lab 12/17/23 1529 12/17/23 1548 12/17/23 1841 12/18/23 0301 12/19/23 0320 12/20/23 0347 12/21/23 0436  NA 140   < >  --    < > 140 140 138  K 4.2   < >  --    < > 3.9 3.7 4.0  CL 95*   < >  --    < > 92* 91* 95*  CO2 32  --   --    < > 36* 37* 33*  GLUCOSE 93   < >  --    < > 103* 93 132*  BUN 13   < >  --    < > 17 15 18   CREATININE 0.65   < >  --    < > 0.70 0.63 0.63  CALCIUM  8.7*  --   --    < > 8.4* 8.8* 8.9  MG  --   --  2.0  --   --  2.2 2.3  PROT 7.1  --   --   --   --   --   --   ALBUMIN  3.4*  --   --   --   --   --   --   AST 25  --   --   --   --   --   --   ALT 18  --   --   --   --   --   --   ALKPHOS 50  --   --   --   --   --   --   BILITOT 1.8*  --   --   --   --   --   --   GFRNONAA >60  --   --    < > >60 >60 >60  ANIONGAP 13  --   --    < > 12 12 10    < > = values in this interval not displayed.    Lipids No results for input(s): CHOL, TRIG, HDL, LABVLDL, LDLCALC, CHOLHDL in the last 168 hours.  Hematology Recent Labs  Lab 12/17/23 1529 12/17/23 1548 12/18/23 0301 12/21/23 0436  WBC 4.7  --  5.0 4.0  RBC 4.25  --  4.30 4.53  HGB 13.8 13.9 13.5 14.6  HCT 42.1 41.0 42.7 44.1  MCV 99.1  --  99.3 97.4  MCH 32.5  --  31.4 32.2  MCHC 32.8  --  31.6 33.1  RDW 15.3  --  15.1 14.9  PLT 111*  --  115* 102*   Thyroid   Recent Labs  Lab 12/17/23 1841  TSH 3.669    BNP Recent Labs  Lab 12/17/23 1529  BNP 247.0*    DDimer No results  for input(s): DDIMER in the last 168 hours.   Radiology  No results found.  Cardiac Studies Cardiac Catheterization: 10/02/2022   LV end diastolic pressure is normal.  LVEDP 12 mm Hg.   There is no aortic valve stenosis.   Recommend Aspirin  81mg  daily for moderate CAD.   Mild, calcific nonobstructive CAD.   Aortic saturation 94%, PA saturation 68%, mean RA pressure 5 mmHg, PA pressure 32/15, mean PA pressure 24 mmHg, cardiac output 5.89 L/min, cardiac index 2.92.   Continue preventive therapy.    Tortuosity in the right subclavian and rotation of the heart make catheter engagement difficult.  AL-1 catheter needed to engage the RCA.  If repeat catheterization was needed in the future, would consider right femoral approach.   Limited Echo: 2022-10-02 IMPRESSIONS   1. Left ventricular ejection fraction, by estimation, is 60 to 65%. The  left ventricle has normal function. The left ventricle has no regional  wall motion abnormalities. The left ventricular internal cavity size was  mildly dilated. Left ventricular  diastolic parameters are consistent with Grade I diastolic dysfunction  (impaired relaxation).   2. Right ventricular systolic function is normal. The right ventricular  size is normal. There is normal pulmonary artery systolic pressure. The  estimated right ventricular systolic pressure is 24.3 mmHg.   3. The mitral valve is normal  in structure. No evidence of mitral valve  regurgitation. No evidence of mitral stenosis.   4. The aortic valve was not well visualized. Aortic valve regurgitation  is not visualized. No aortic stenosis is present.   5. The inferior vena cava is normal in size with greater than 50%  respiratory variability, suggesting right atrial pressure of 3 mmHg.    ECHO IMPRESSIONS 12/18/2023  1. Left ventricular ejection fraction, by estimation, is 65 to 70%. The  left ventricle has normal function. The left ventricle has no regional  wall motion  abnormalities. Left ventricular diastolic parameters are  indeterminate.   2. Right ventricular systolic function is normal. The right ventricular  size is normal. Tricuspid regurgitation signal is inadequate for assessing  PA pressure.   3. Left atrial size was mildly dilated.   4. The mitral valve is normal in structure. No evidence of mitral valve  regurgitation. No evidence of mitral stenosis.   5. The aortic valve is tricuspid. Aortic valve regurgitation is not  visualized. No aortic stenosis is present.   6. The inferior vena cava is normal in size with greater than 50%  respiratory variability, suggesting right atrial pressure of 3 mmHg.    Patient Profile   73 y.o. female with a hx of CAD (cath in 08/2022 showing mild, nonobstructive CAD), chronic HFpEF, HTN, HLD and COPD who is being seen 12/18/2023 for the evaluation of new-onset atrial fibrillation, bradycardia and CHF at the request of Dr. Evonnie.   Assessment & Plan  Acute HFpEF Echocardiogram in 08/2022 showed a preserved EF of 60 to 65% with grade 1 diastolic dysfunction and normal RV function.  Presented with worsening dyspnea and lower extremity edema. BNP 247 but likely inaccurate given body habitus.  CXR showed pulmonary edema and small to moderate right pleural effusion.  ECHO 12/18/2023 showed EF 65 to 70%, mildly dilated LA, normal RV function, indeterminate diastolic parameters. Received  IV lasix  80 mg x1 then IV 40 mg BID for past 3 days. Started today on PO Lasix  40 mg daily.  Net I/O -6,312 ml, wt 268 >266, Cr 0.63, K 4, Mg 2.3 Unknown baseline weight but was previously at 272 lbs in 08/2022 so will need to establish new dry weight.  Appears Euvolemic on exam. Continue with PO Lasix  40 mg daily, Spironolactone  25 mg daily.  Avoid SGLT2 inhibitor given her UTI.   New Onset Atrial Fibrillation/Flutter EKG on admission showed coarse atrial fibrillation and difficult to distinguish atrial fibrillation vs. flutter on  telemetry. She spontaneously converted while in the ED and did have recurrence 9/18 evening around 2200 with heart rates in the 60's but spontaneously converted. Longest postconversion pause was 2.58 seconds.  She has since been in sinus bradycardia with heart rate in the 40's.  Her CHA2DS2-VASc score is 5 and started on Eliquis  5 mg twice daily for anticoagulation this admission. Discontinued ASA 81 mg daily as she does not have a strong indication for both. Tele review today: Mainly NSR with HR 50-70's with episodes of afib 9/21 1530 to 9/22 230 with HR in 90-100's then  back to NSR. Currently back in afib since 9/22 630 am with HR in 90-100's.   Reports palpitations throughout weekend but not able to describe.  Previously on Toprol -XL 50 mg daily prior to admission but discontinued due to bradycardia. Per Dr. Debera, with episodes of RVR should start rate control and consider restarting low dose Toprol  to see how HR responds. Continue on Eliquis  5  mg BID.  Plan for outpatient monitor to assess afib burden and rates.  Based on monitor, can reassess if rate vs rhythm control   CAD Cardiac catheterization in 08/2022 showed mild, nonobstructive CAD as outlined above. Hs troponin values have been negative and EKG without acute ST changes.  Echo this admission as above Discontinued ASA given the need for anticoagulation.  Continue PTA Atorvastatin  40 mg daily.   HTN BP elevated this am 148/64, however this was prior to BP medications. Would continue to monitor before changing BP medications. Will contact nurse to repeat BP.  Continue on Spironolactone  25 mg daily.   Holding Toprol -XL as outlined above.   Hypokalemia Improved with KCL supplement. Now, K+ 4  and MG 2.3 Also on Spironolactone  as well. Continue to monitor   AMS/UTI CT Head showed no acute intracranial abnormalities but UA was concerning for UTI with culture pending. Suspect AMS is secondary to this. She is on Ceftriaxone .  Management per the admitting team.   Chronic hypoxic respiratory failure She is on 2 L nasal cannula at baseline but goes without this at times.    For questions or updates, please contact [facility] Please consult [email or web address] for contact info under       Signed, Lorette CINDERELLA Kapur, PA-C  12/21/2023, 9:09 AM

## 2023-12-21 NOTE — Progress Notes (Signed)
 Mobility Specialist Progress Note:    12/21/23 1300  Mobility  Activity Pivoted/transferred from bed to chair  Level of Assistance Moderate assist, patient does 50-74%  Assistive Device Front wheel walker  Distance Ambulated (ft) 3 ft  Range of Motion/Exercises Active;All extremities  Activity Response Tolerated well  Mobility Referral Yes  Mobility visit 1 Mobility  Mobility Specialist Start Time (ACUTE ONLY) 1300  Mobility Specialist Stop Time (ACUTE ONLY) 1319  Mobility Specialist Time Calculation (min) (ACUTE ONLY) 19 min   Pt received in chair, requiring assistance to wheelchair. NT and RN in room, required ModA to stand and transfer with RW. Tolerated well, all needs met.  Khrista Braun Mobility Specialist Please contact via Special educational needs teacher or  Rehab office at [phone]

## 2023-12-21 NOTE — TOC Transition Note (Signed)
 Transition of Care [facility]) - Discharge Note   Patient Details  Name: Jenna Sherman MRN: [id number] Date of Birth: 09-01-1950  [facility]) CM/SW Contact:  Sharlyne Stabs, RN Phone Number: 12/21/2023, 11:52 AM   Clinical Narrative:   Patient is discharging home with [facility] home health.  Orders are in and [name] has been updated. CM spouse with her spouse and his daughter, Grayce.  Patient is in need of new oxygen  tubing and a portable tank. Patient has home oxygen  at 3L, provided by [employer].  CM sent the referral to Zach with [employer] to get new supplies. CHF education added to AVS.     Final next level of care: Home w Home Health Services Barriers to Discharge: Barriers Resolved   Patient Goals and CMS Choice Patient states their goals for this hospitalization and ongoing recovery are:: to return home. CMS Medicare.gov Compare Post Acute Care list provided to:: Patient Represenative (must comment) Choice offered to / list presented to : Spouse [place] ownership interest in [facility].provided to:: Spouse   Discharge Placement               Patient to be transferred to facility by: Spouse Name of family member notified: Grayce - daughter and Spouse Patient and family notified of of transfer: 12/21/23  Discharge Plan and Services Additional resources added to the After Visit Summary for                 DME Arranged: Other see comment, Oxygen  (New oxygen  tubing and portable tanks) DME Agency: [employer] Date DME Agency Contacted: 12/21/23 Time DME Agency Contacted: 1152 Representative spoke with at DME Agency: Darlyn     Social Drivers of Health (SDOH) Interventions SDOH Screenings   Food Insecurity: No Food Insecurity (12/17/2023)  Housing: Low Risk  (12/17/2023)  Transportation Needs: No Transportation Needs (12/17/2023)  Utilities: Not At Risk (12/17/2023)  Social Connections: Moderately Integrated (12/17/2023)  Tobacco Use: Medium Risk (12/17/2023)

## 2023-12-21 NOTE — TOC Progression Note (Signed)
 DME -Progression Note    Patient Details  Name: Jenna Sherman MRN: [id number] Date of Birth: Dec 13, 1950  Transition of Care [facility]) CM/SW Contact  Sharlyne Stabs, RN Phone Number: 12/21/2023, 12:43 PM  Patient need POC evaluation:   Please evaluate for POC using 1-6 pulse dose. If patient qualifies please dispense.      Expected Discharge Plan and Services        Expected Discharge Date: 12/21/23               DME Arranged: Other see comment, Oxygen  (New oxygen  tubing and portable tanks) DME Agency: [employer] Date DME Agency Contacted: 12/21/23 Time DME Agency Contacted: 1152 Representative spoke with at DME Agency: Zach

## 2023-12-21 NOTE — Plan of Care (Signed)

## 2023-12-21 NOTE — Evaluation (Signed)
 Physical Therapy Evaluation Patient Details Name: Jenna Sherman MRN: [id number] DOB: 19-Jul-1950 Today's Date: 12/21/2023  History of Present Illness  Jenna Sherman is a 73 y.o. female with medical history significant for diastolic CHF, hypertension, OSA.  Patient presents altered mental status, difficulty breathing lower extremity swelling and weight gain.  Patient has been short of breath over the past 2 to 3 days, but confusion started yesterday.  Patient is supposed to be on 2 L of oxygen , but per spouse who patient lives with, patient barely uses it.  Is also been complaining of pain with urination and urinary frequency.    No fevers no chills.  Since she has been getting O2 in the ED, mental status has significantly improved, but spouse and daughter reports some mild ongoing confusion.   Clinical Impression  Pt was agreeable to completing today's PT evaluation. She required modA for all bed and functional mobility. She also required the use of the RW to transfer from EOB to the chair. She was left in the chair with the call bell within reach, chair alarm set, and the PA present. Patient to be discharged home today and discharged from acute physical therapy with recommendations stated below.        If plan is discharge home, recommend the following: A lot of help with walking and/or transfers;A lot of help with bathing/dressing/bathroom;Assistance with cooking/housework;Assist for transportation;Help with stairs or ramp for entrance   Can travel by private vehicle   Yes    Equipment Recommendations None recommended by PT  Recommendations for Other Services       Functional Status Assessment Patient has had a recent decline in their functional status and demonstrates the ability to make significant improvements in function in a reasonable and predictable amount of time.     Precautions / Restrictions Precautions Precautions: Fall Recall of Precautions/Restrictions:  Intact Restrictions Weight Bearing Restrictions Per Provider Order: No      Mobility  Bed Mobility Overal bed mobility: Needs Assistance Bed Mobility: Supine to Sit     Supine to sit: Mod assist, HOB elevated          Transfers Overall transfer level: Needs assistance Equipment used: Rolling walker (2 wheels) Transfers: Sit to/from Stand, Bed to chair/wheelchair/BSC Sit to Stand: Mod assist   Step pivot transfers: Mod assist            Ambulation/Gait Ambulation/Gait assistance: Mod assist Gait Distance (Feet): 4 Feet Assistive device: Rolling walker (2 wheels) Gait Pattern/deviations: Step-to pattern, Decreased stride length, Shuffle Gait velocity: slow        Stairs            Wheelchair Mobility     Tilt Bed    Modified Rankin (Stroke Patients Only)       Balance Overall balance assessment: Needs assistance Sitting-balance support: Feet supported, No upper extremity supported Sitting balance-Leahy Scale: Fair Sitting balance - Comments: eated EOB   Standing balance support: Bilateral upper extremity supported, During functional activity, Reliant on assistive device for balance Standing balance-Leahy Scale: Poor Standing balance comment: with RW                             Pertinent Vitals/Pain Pain Assessment Pain Assessment: No/denies pain    Home Living Family/patient expects to be discharged to:: Private residence Living Arrangements: Spouse/significant other Available Help at Discharge: Family;Available 24 hours/day Type of Home: House Home Access: Stairs to enter  Entrance Stairs-Number of Steps: 1+3   Home Layout: One level Home Equipment: Control and instrumentation engineer (2 wheels)      Prior Function Prior Level of Function : Needs assist       Physical Assist : Mobility (physical) Mobility (physical): Bed mobility;Transfers;Gait   Mobility Comments: Pt only ambulates short distances from bed to  bsc ADLs Comments: Pt requires assistance with ADL's     Extremity/Trunk Assessment   Upper Extremity Assessment Upper Extremity Assessment: Generalized weakness    Lower Extremity Assessment Lower Extremity Assessment: Generalized weakness    Cervical / Trunk Assessment Cervical / Trunk Assessment: Kyphotic  Communication   Communication Communication: Impaired Factors Affecting Communication: Hearing impaired    Cognition Arousal: Alert Behavior During Therapy: WFL for tasks assessed/performed   PT - Cognitive impairments: No apparent impairments                         Following commands: Intact       Cueing Cueing Techniques: Verbal cues, Tactile cues, Visual cues     General Comments      Exercises     Assessment/Plan    PT Assessment Patient needs continued PT services  PT Problem List Decreased strength;Decreased activity tolerance;Decreased balance;Decreased mobility       PT Treatment Interventions DME instruction;Gait training;Stair training;Functional mobility training;Therapeutic activities;Therapeutic exercise;Balance training;Patient/family education    PT Goals (Current goals can be found in the Care Plan section)  Acute Rehab PT Goals Patient Stated Goal: pt would like to return home. PT Goal Formulation: With patient Time For Goal Achievement: 12/25/23 Potential to Achieve Goals: Good    Frequency Min 3X/week     Co-evaluation               AM-PAC PT 6 Clicks Mobility  Outcome Measure Help needed turning from your back to your side while in a flat bed without using bedrails?: A Little Help needed moving from lying on your back to sitting on the side of a flat bed without using bedrails?: A Little Help needed moving to and from a bed to a chair (including a wheelchair)?: A Little Help needed standing up from a chair using your arms (e.g., wheelchair or bedside chair)?: A Lot Help needed to walk in hospital room?: A  Lot Help needed climbing 3-5 steps with a railing? : Total 6 Click Score: 14    End of Session Equipment Utilized During Treatment: Gait belt;Oxygen  Activity Tolerance: Patient limited by fatigue Patient left: in chair;with call bell/phone within reach;with chair alarm set;Other (comment) (with PA present) Nurse Communication: Mobility status PT Visit Diagnosis: Unsteadiness on feet (R26.81);Muscle weakness (generalized) (M62.81);Difficulty in walking, not elsewhere classified (R26.2)    Time: 9085-9061 PT Time Calculation (min) (ACUTE ONLY): 24 min   Charges:   PT Evaluation $PT Eval Moderate Complexity: 1 Mod PT Treatments $Therapeutic Activity: 8-22 mins PT General Charges $$ ACUTE PT VISIT: 1 Visit         Lacinda Fass, PT, DPT  12/21/2023, 12:25 PM

## 2023-12-21 NOTE — Plan of Care (Signed)

## 2023-12-22 ENCOUNTER — Telehealth: Payer: Self-pay | Admitting: *Deleted

## 2023-12-22 NOTE — Transitions of Care (Post Inpatient/ED Visit) (Signed)
   12/22/2023  Name: Jenna Sherman MRN: [id number] DOB: [birth year]  Today's TOC FU Call Status: Today's TOC FU Call Status:: Unsuccessful Call (1st Attempt) Unsuccessful Call (1st Attempt) Date: 12/22/23  Attempted to reach the patient regarding the most recent Inpatient visit.  Multiple attempts placed on both numbers listed for patient: unable to leave voice message on either number:   Home: rang rang without physical or voice mail pick up Mobile: (appears to be husband's number) Received automated outgoing voice message stating that the person you are trying to call has a voice mail box that is full; please hang up and try your call again later   Follow Up Plan: Additional outreach attempts will be made to reach the patient to complete the Transitions of Care (Post Inpatient/ED visit) call.   Pls call/ message for questions,  Cezar Misiaszek Mckinney Marycarmen Hagey, RN, BSN, CCRN Alumnus RN Care Manager  Transitions of Care  [facility] [phone]: direct office

## 2023-12-23 ENCOUNTER — Telehealth: Payer: Self-pay | Admitting: *Deleted

## 2023-12-23 ENCOUNTER — Encounter: Payer: Self-pay | Admitting: *Deleted

## 2023-12-23 DIAGNOSIS — E782 Mixed hyperlipidemia: Secondary | ICD-10-CM | POA: Diagnosis not present

## 2023-12-23 DIAGNOSIS — M170 Bilateral primary osteoarthritis of knee: Secondary | ICD-10-CM | POA: Diagnosis not present

## 2023-12-23 DIAGNOSIS — Z7409 Other reduced mobility: Secondary | ICD-10-CM | POA: Diagnosis not present

## 2023-12-23 DIAGNOSIS — I2510 Atherosclerotic heart disease of native coronary artery without angina pectoris: Secondary | ICD-10-CM | POA: Diagnosis not present

## 2023-12-23 DIAGNOSIS — G4733 Obstructive sleep apnea (adult) (pediatric): Secondary | ICD-10-CM | POA: Diagnosis not present

## 2023-12-23 DIAGNOSIS — J309 Allergic rhinitis, unspecified: Secondary | ICD-10-CM | POA: Diagnosis not present

## 2023-12-23 DIAGNOSIS — K2100 Gastro-esophageal reflux disease with esophagitis, without bleeding: Secondary | ICD-10-CM | POA: Diagnosis not present

## 2023-12-23 DIAGNOSIS — I5032 Chronic diastolic (congestive) heart failure: Secondary | ICD-10-CM | POA: Diagnosis not present

## 2023-12-23 DIAGNOSIS — J449 Chronic obstructive pulmonary disease, unspecified: Secondary | ICD-10-CM | POA: Diagnosis not present

## 2023-12-23 DIAGNOSIS — I10 Essential (primary) hypertension: Secondary | ICD-10-CM | POA: Diagnosis not present

## 2023-12-23 NOTE — Transitions of Care (Post Inpatient/ED Visit) (Signed)
   12/23/2023  Name: Jenna Sherman MRN: [id number] DOB: June 15, 1950  Today's TOC FU Call Status: Today's TOC FU Call Status:: Unsuccessful Call (2nd Attempt) Unsuccessful Call (2nd Attempt) Date: 12/23/23  Attempted to reach the patient regarding the most recent Inpatient visit.  Phone rang multiple times without physical or voice mail pick up- unable to leave voice message requesting call back   Follow Up Plan: Additional outreach attempts will be made to reach the patient to complete the Transitions of Care (Post Inpatient/ED visit) call.   Pls call/ message for questions,  Harvey Lingo Mckinney Ray Glacken, RN, BSN, CCRN Alumnus RN Care Manager  Transitions of Care  [facility] [phone]: direct office

## 2023-12-24 ENCOUNTER — Telehealth: Payer: Self-pay

## 2023-12-24 NOTE — Transitions of Care (Post Inpatient/ED Visit) (Signed)
   12/24/2023  Name: Jenna Sherman MRN: [id number] DOB: [birth year]  Today's TOC FU Call Status: Today's TOC FU Call Status:: Unsuccessful Call (3rd Attempt) Unsuccessful Call (3rd Attempt) Date: 12/24/23  Attempted to reach the patient regarding the most recent Inpatient/ED visit.  Follow Up Plan: No further outreach attempts will be made at this time. We have been unable to contact the patient.  Alan Ee, RN, BSN, CEN [employer]- Transition of Care Team.  [facility] [phone]

## 2023-12-29 NOTE — TOC Progression Note (Signed)
 Transition of Care [facility]) - Progression Note    Patient Details  Name: Jenna Sherman MRN: [id number] Date of Birth: [birth year]  [facility]) CM/SW Contact  Sharlyne Stabs, RN Phone Number: 12/29/2023, 11:45 AM  Clinical Narrative:   Family called, [employer] can not come in the home to do POC eval. They are considering changing insurance and oxygen  companies. Referral sent to [facility] with [facility] for review. TOC following.       Expected Discharge Date: 12/21/23               DME Arranged: Other see comment, Oxygen  (New oxygen  tubing and portable tanks) DME Agency: [employer] Date DME Agency Contacted: 12/21/23 Time DME Agency Contacted: 1152 Representative spoke with at DME Agency: Darlyn             Social Drivers of Health (SDOH) Interventions SDOH Screenings   Food Insecurity: No Food Insecurity (12/17/2023)  Housing: Low Risk  (12/17/2023)  Transportation Needs: No Transportation Needs (12/17/2023)  Utilities: Not At Risk (12/17/2023)  Social Connections: Moderately Integrated (12/17/2023)  Tobacco Use: Medium Risk (12/17/2023)

## 2024-01-04 DIAGNOSIS — D696 Thrombocytopenia, unspecified: Secondary | ICD-10-CM | POA: Diagnosis not present

## 2024-01-04 DIAGNOSIS — I10 Essential (primary) hypertension: Secondary | ICD-10-CM | POA: Diagnosis not present

## 2024-01-04 DIAGNOSIS — J9611 Chronic respiratory failure with hypoxia: Secondary | ICD-10-CM | POA: Diagnosis not present

## 2024-01-11 DIAGNOSIS — R197 Diarrhea, unspecified: Secondary | ICD-10-CM | POA: Diagnosis not present

## 2024-01-12 DIAGNOSIS — M170 Bilateral primary osteoarthritis of knee: Secondary | ICD-10-CM | POA: Diagnosis not present

## 2024-01-12 DIAGNOSIS — J309 Allergic rhinitis, unspecified: Secondary | ICD-10-CM | POA: Diagnosis not present

## 2024-01-12 DIAGNOSIS — I5032 Chronic diastolic (congestive) heart failure: Secondary | ICD-10-CM | POA: Diagnosis not present

## 2024-01-12 DIAGNOSIS — I10 Essential (primary) hypertension: Secondary | ICD-10-CM | POA: Diagnosis not present

## 2024-01-12 DIAGNOSIS — G4733 Obstructive sleep apnea (adult) (pediatric): Secondary | ICD-10-CM | POA: Diagnosis not present

## 2024-01-12 DIAGNOSIS — E782 Mixed hyperlipidemia: Secondary | ICD-10-CM | POA: Diagnosis not present

## 2024-01-12 DIAGNOSIS — K2100 Gastro-esophageal reflux disease with esophagitis, without bleeding: Secondary | ICD-10-CM | POA: Diagnosis not present

## 2024-01-12 DIAGNOSIS — I2510 Atherosclerotic heart disease of native coronary artery without angina pectoris: Secondary | ICD-10-CM | POA: Diagnosis not present

## 2024-01-12 DIAGNOSIS — J449 Chronic obstructive pulmonary disease, unspecified: Secondary | ICD-10-CM | POA: Diagnosis not present

## 2024-01-12 DIAGNOSIS — Z7409 Other reduced mobility: Secondary | ICD-10-CM | POA: Diagnosis not present

## 2024-01-21 DIAGNOSIS — D696 Thrombocytopenia, unspecified: Secondary | ICD-10-CM | POA: Diagnosis not present

## 2024-01-21 DIAGNOSIS — I10 Essential (primary) hypertension: Secondary | ICD-10-CM | POA: Diagnosis not present

## 2024-01-21 DIAGNOSIS — J9611 Chronic respiratory failure with hypoxia: Secondary | ICD-10-CM | POA: Diagnosis not present

## 2024-01-26 DIAGNOSIS — M170 Bilateral primary osteoarthritis of knee: Secondary | ICD-10-CM | POA: Diagnosis not present

## 2024-01-26 DIAGNOSIS — J309 Allergic rhinitis, unspecified: Secondary | ICD-10-CM | POA: Diagnosis not present

## 2024-01-26 DIAGNOSIS — G4733 Obstructive sleep apnea (adult) (pediatric): Secondary | ICD-10-CM | POA: Diagnosis not present

## 2024-01-26 DIAGNOSIS — I2510 Atherosclerotic heart disease of native coronary artery without angina pectoris: Secondary | ICD-10-CM | POA: Diagnosis not present

## 2024-01-26 DIAGNOSIS — I10 Essential (primary) hypertension: Secondary | ICD-10-CM | POA: Diagnosis not present

## 2024-01-26 DIAGNOSIS — I5032 Chronic diastolic (congestive) heart failure: Secondary | ICD-10-CM | POA: Diagnosis not present

## 2024-01-26 DIAGNOSIS — J449 Chronic obstructive pulmonary disease, unspecified: Secondary | ICD-10-CM | POA: Diagnosis not present

## 2024-01-26 DIAGNOSIS — K2100 Gastro-esophageal reflux disease with esophagitis, without bleeding: Secondary | ICD-10-CM | POA: Diagnosis not present

## 2024-01-26 DIAGNOSIS — Z7409 Other reduced mobility: Secondary | ICD-10-CM | POA: Diagnosis not present

## 2024-01-26 DIAGNOSIS — E782 Mixed hyperlipidemia: Secondary | ICD-10-CM | POA: Diagnosis not present
